# Patient Record
Sex: Female | Born: 1940 | Race: White | Hispanic: No | Smoking: Former smoker
Health system: Southern US, Community
[De-identification: ages and names within clinical notes are randomized; demographics above are authoritative.]

## PROBLEM LIST (undated history)

## (undated) DIAGNOSIS — C801 Malignant (primary) neoplasm, unspecified: Secondary | ICD-10-CM

## (undated) DIAGNOSIS — I2510 Atherosclerotic heart disease of native coronary artery without angina pectoris: Secondary | ICD-10-CM

## (undated) DIAGNOSIS — I5022 Chronic systolic (congestive) heart failure: Secondary | ICD-10-CM

## (undated) DIAGNOSIS — C259 Malignant neoplasm of pancreas, unspecified: Secondary | ICD-10-CM

## (undated) DIAGNOSIS — E785 Hyperlipidemia, unspecified: Secondary | ICD-10-CM

## (undated) HISTORY — PX: BREAST SURGERY: SHX581

## (undated) HISTORY — PX: OTHER SURGICAL HISTORY: SHX169

## (undated) HISTORY — PX: OVARY SURGERY: SHX727

## (undated) HISTORY — DX: Hyperlipidemia, unspecified: E78.5

## (undated) HISTORY — DX: Malignant (primary) neoplasm, unspecified: C80.1

## (undated) HISTORY — PX: EYE SURGERY: SHX253

## (undated) HISTORY — PX: COLONOSCOPY: SHX174

## (undated) HISTORY — DX: Atherosclerotic heart disease of native coronary artery without angina pectoris: I25.10

---

## 1999-02-25 HISTORY — PX: CORONARY ARTERY BYPASS GRAFT: SHX141

## 1999-02-25 HISTORY — PX: CARDIAC CATHETERIZATION: SHX172

## 2014-09-28 ENCOUNTER — Ambulatory Visit (INDEPENDENT_AMBULATORY_CARE_PROVIDER_SITE_OTHER): Payer: Medicare HMO | Admitting: Family Medicine

## 2014-09-28 VITALS — BP 142/80 | HR 70 | Temp 97.6°F | Resp 16 | Ht <= 58 in | Wt 153.2 lb

## 2014-09-28 DIAGNOSIS — I10 Essential (primary) hypertension: Secondary | ICD-10-CM | POA: Diagnosis not present

## 2014-09-28 MED ORDER — METOPROLOL TARTRATE 25 MG PO TABS: 12.5000 mg | ORAL_TABLET | Freq: Two times a day (BID) | ORAL | Status: DC

## 2014-09-28 NOTE — Progress Notes (Signed)
Subjective:   This chart was scribed for Dr. Delman Cheadle, MD by Erling Conte, ED Scribe. This patient was seen in Room 2 and the patient's care was started at 8:55 AM.   Patient ID: Tammy Schmitt, female    DOB: February 07, 1941, 74 y.o.   MRN: [id number]  Chief Complaint  Patient presents with  . Medication Refill    metoprolol tart tab 25mg      HPI HPI Comments: Tammy Schmitt is a 74 y.o. female who presents to the [facility] for a refill of her metoprolol medication. Pt is taking the medication with no complaints. She states she is new to [facility] and has an app with [facility] and has an appt in September. Pt does not have a BP cuff at home. Pt brought in her lab work results from April and she had a normal CMP, LDL 121 and non HDL 146. She regularly takes a 325 mg aspirin daily. She has a h/o GERD for which she takes omeprazole. Pt previously took Lipitor and states she did not like taking it due to the muscle aches. She denies any chest pain, dizziness, lightheadedness, SOB, heart palpitations, wheezing, HA or leg swelling.   There are no active problems to display for this patient.  Past Medical History  Diagnosis Date  . Cancer   . Cataract   . CHF (congestive heart failure)   . GERD (gastroesophageal reflux disease)   . Myocardial infarction    Past Surgical History  Procedure Laterality Date  . Breast surgery    . Eye surgery    . Abdominal hysterectomy     No Known Allergies Prior to Admission medications   Medication Sig Start Date End Date Taking? Authorizing Provider  aspirin 325 MG tablet Take 325 mg by mouth daily.   Yes Historical Provider, MD  metoprolol succinate (TOPROL-XL) 25 MG 24 hr tablet Take 25 mg by mouth daily.   Yes Historical Provider, MD  nitroGLYCERIN (NITROSTAT) 0.4 MG SL tablet Place 0.4 mg under the tongue every 5 (five) minutes as needed for chest pain.   Yes Historical Provider, MD  omeprazole (PRILOSEC)  10 MG capsule Take 10 mg by mouth daily.   Yes Historical Provider, MD   History   Social History  . Marital Status: Divorced    Spouse Name: N/A  . Number of Children: N/A  . Years of Education: N/A   Occupational History  . Not on file.   Social History Main Topics  . Smoking status: Former Research scientist (life sciences)  . Smokeless tobacco: Never Used  . Alcohol Use: 1.8 oz/week    3 Standard drinks or equivalent per week  . Drug Use: No  . Sexual Activity: Not on file   Other Topics Concern  . Not on file   Social History Narrative  . No narrative on file     Review of Systems  Constitutional: Negative for fever, chills, diaphoresis and appetite change.  Eyes: Negative for visual disturbance.  Respiratory: Negative for cough, shortness of breath and wheezing.   Cardiovascular: Negative for chest pain, palpitations and leg swelling.  Genitourinary: Negative for decreased urine volume.  Neurological: Negative for dizziness, syncope, light-headedness and headaches.  Hematological: Does not bruise/bleed easily.       Objective:  BP 142/80 mmHg  Pulse 70  Temp(Src) 97.6 F (36.4 C) (Oral)  Resp 16  Ht 4\' 10"  (1.473 m)  Wt 153 lb 3.2 oz (69.491 kg)  BMI 32.03  kg/m2  SpO2 99%    Physical Exam  Constitutional: She is oriented to person, place, and time. She appears well-developed and well-nourished. No distress.  HENT:  Head: Normocephalic and atraumatic.  Eyes: Conjunctivae and EOM are normal.  Neck: Neck supple. No tracheal deviation present.  Cardiovascular: Normal rate, regular rhythm, S1 normal, S2 normal and normal heart sounds.   No murmur heard. Pulses:      Dorsalis pedis pulses are 2+ on the right side, and 2+ on the left side.  Pulmonary/Chest: Effort normal and breath sounds normal. No respiratory distress. She has no wheezes. She has no rhonchi. She has no rales.  Musculoskeletal: Normal range of motion.       Right lower leg: She exhibits no swelling.       Left  lower leg: She exhibits no swelling.  Neurological: She is alert and oriented to person, place, and time.  Skin: Skin is warm and dry.  Psychiatric: She has a normal mood and affect. Her behavior is normal.  Nursing note and vitals reviewed.       Assessment & Plan:   1. Essential hypertension, benign   Meds refilled. Pt has appt to est w/ new PCP since recently moved to [place].  Meds ordered this encounter  Medications  . aspirin 325 MG tablet    Sig: Take 325 mg by mouth daily.  Marland Kitchen omeprazole (PRILOSEC) 10 MG capsule    Sig: Take 10 mg by mouth daily.  . nitroGLYCERIN (NITROSTAT) 0.4 MG SL tablet    Sig: Place 0.4 mg under the tongue every 5 (five) minutes as needed for chest pain.  Marland Kitchen DISCONTD: metoprolol succinate (TOPROL-XL) 25 MG 24 hr tablet    Sig: Take 25 mg by mouth daily.  . metoprolol tartrate (LOPRESSOR) 25 MG tablet    Sig: Take 0.5 tablets (12.5 mg total) by mouth 2 (two) times daily.    Dispense:  90 tablet    Refill:  3    I personally performed the services described in this documentation, which was scribed in my presence. The recorded information has been reviewed and considered, and addended by me as needed.  Delman Cheadle, MD MPH

## 2014-09-28 NOTE — Patient Instructions (Signed)

## 2014-11-08 ENCOUNTER — Ambulatory Visit (INDEPENDENT_AMBULATORY_CARE_PROVIDER_SITE_OTHER): Payer: Medicare HMO | Admitting: Cardiovascular Disease

## 2014-11-08 ENCOUNTER — Encounter: Payer: Self-pay | Admitting: Cardiovascular Disease

## 2014-11-08 VITALS — BP 144/88 | HR 60 | Ht <= 58 in | Wt 155.4 lb

## 2014-11-08 DIAGNOSIS — I10 Essential (primary) hypertension: Secondary | ICD-10-CM

## 2014-11-08 DIAGNOSIS — I2510 Atherosclerotic heart disease of native coronary artery without angina pectoris: Secondary | ICD-10-CM | POA: Diagnosis not present

## 2014-11-08 LAB — HEPATIC FUNCTION PANEL
ALBUMIN: 4.1 g/dL (ref 3.5–5.2)
ALK PHOS: 77 U/L (ref 39–117)
ALT: 9 U/L (ref 0–35)
AST: 17 U/L (ref 0–37)
Bilirubin, Direct: 0.1 mg/dL (ref 0.0–0.3)
TOTAL PROTEIN: 7.1 g/dL (ref 6.0–8.3)
Total Bilirubin: 0.5 mg/dL (ref 0.2–1.2)

## 2014-11-08 LAB — BASIC METABOLIC PANEL
BUN: 21 mg/dL (ref 6–23)
CALCIUM: 9.5 mg/dL (ref 8.4–10.5)
CO2: 24 mEq/L (ref 19–32)
Chloride: 103 mEq/L (ref 96–112)
Creatinine, Ser: 0.54 mg/dL (ref 0.40–1.20)
GFR: 117.35 mL/min (ref >60.00)
GLUCOSE: 87 mg/dL (ref 70–99)
Potassium: 4.1 mEq/L (ref 3.5–5.1)
Sodium: 137 mEq/L (ref 135–145)

## 2014-11-08 LAB — LIPID PANEL
Cholesterol: 220 mg/dL — ABNORMAL HIGH (ref 0–200)
HDL: 50.1 mg/dL
LDL Cholesterol: 143 mg/dL — ABNORMAL HIGH (ref 0–99)
NonHDL: 170.38
Total CHOL/HDL Ratio: 4
Triglycerides: 138 mg/dL (ref 0.0–149.0)
VLDL: 27.6 mg/dL (ref 0.0–40.0)

## 2014-11-08 MED ORDER — LOSARTAN POTASSIUM 50 MG PO TABS: 50.0000 mg | ORAL_TABLET | Freq: Every day | ORAL | Status: DC

## 2014-11-08 MED ORDER — ASPIRIN EC 81 MG PO TBEC: 81.0000 mg | DELAYED_RELEASE_TABLET | Freq: Every day | ORAL | Status: AC

## 2014-11-08 MED ORDER — EZETIMIBE 10 MG PO TABS: 10.0000 mg | ORAL_TABLET | Freq: Every day | ORAL | Status: DC

## 2014-11-08 NOTE — Progress Notes (Signed)
Cardiology Office Note   Date:  11/08/2014   ID:  Tammy Schmitt, DOB Apr 21, 1940, MRN [id number]  PCP:  Tammy Frees, MD  Cardiologist:   Tammy Headings, MD   Chief Complaint  Patient presents with  . Coronary Artery Disease   Problem list: 1. Coronary artery disease-status post coronary artery bypass grafting status post RIMA to the right coronary artery and saphenous vein graft to the posterior lateral circumflex proximal artery in 2002 2. Hyperlipidemia:  3. Mild carotid artery disease:  4. Mild chronic systolic congestive heart failure 5. Hx of breast cancer ( left ) , s/p XRT and chemo and surgery /    History of Present Illness: Tammy Schmitt is a 74 y.o. female who presents to establish care for her CAD.  Moved from [place] this past spring  Has had some indigestion / heartburn.  No angina .  Relieved with rolaids. BP has been higher recently  Diet is about the same. Has joined the  [facility] but has not been exercising. Thinks she can do better with her nutrition .  Not able to walk far  -   Past Medical History  Diagnosis Date  . Cancer   . Cataract   . CHF (congestive heart failure)   . GERD (gastroesophageal reflux disease)   . Myocardial infarction     Past Surgical History  Procedure Laterality Date  . Breast surgery    . Eye surgery    . Abdominal hysterectomy       Current Outpatient Prescriptions  Medication Sig Dispense Refill  . aspirin 325 MG tablet Take 325 mg by mouth daily.    . metoprolol tartrate (LOPRESSOR) 25 MG tablet Take 0.5 tablets (12.5 mg total) by mouth 2 (two) times daily. 90 tablet 3  . nitroGLYCERIN (NITROSTAT) 0.4 MG SL tablet Place 0.4 mg under the tongue every 5 (five) minutes as needed for chest pain.    Marland Kitchen omeprazole (PRILOSEC) 10 MG capsule Take 10 mg by mouth daily.     No current facility-administered medications for this visit.    Allergies:   Review of patient's allergies indicates no known  allergies.    Social History:  The patient  reports that she has quit smoking. She has never used smokeless tobacco. She reports that she drinks about 1.8 oz of alcohol per week. She reports that she does not use illicit drugs.   Family History:  The patient's family history includes Cancer in her mother; Heart disease in her brother and father.    ROS:  Please see the history of present illness.    Review of Systems: Constitutional:  denies fever, chills, diaphoresis, appetite change and fatigue.  HEENT: denies photophobia, eye pain, redness, hearing loss, ear pain, congestion, sore throat, rhinorrhea, sneezing, neck pain, neck stiffness and tinnitus.  Respiratory: admits to  DOE,    Cardiovascular: denies chest pain, palpitations and leg swelling.  Gastrointestinal: denies nausea, vomiting, abdominal pain, diarrhea, constipation, blood in stool.  Genitourinary: denies dysuria, urgency, frequency, hematuria, flank pain and difficulty urinating.  Musculoskeletal: denies  myalgias, back pain, joint swelling, arthralgias and gait problem.   Skin: denies pallor, rash and wound.  Neurological: denies dizziness, seizures, syncope, weakness, light-headedness, numbness and headaches.   Hematological: denies adenopathy, easy bruising, personal or family bleeding history.  Psychiatric/ Behavioral: denies suicidal ideation, mood changes, confusion, nervousness, sleep disturbance and agitation.       All other systems are reviewed and negative.  PHYSICAL EXAM: VS:  BP 144/88 mmHg  Pulse 60  Ht 4\' 10"  (1.473 m)  Wt 70.489 kg (155 lb 6.4 oz)  BMI 32.49 kg/m2 , BMI Body mass index is 32.49 kg/(m^2). GEN: Well nourished, well developed, in no acute distress HEENT: normal Neck: no JVD, carotid bruits, or masses Cardiac: RRR; no murmurs, rubs, or gallops,no edema  Respiratory:  clear to auscultation bilaterally, normal work of breathing GI: soft, nontender, nondistended, + BS MS: no  deformity or atrophy Skin: warm and dry, no rash Neuro:  Strength and sensation are intact Psych: normal   EKG:  EKG is ordered today. The ekg ordered today demonstrates  NSR at 60, NS ST abn.     Recent Labs: No results found for requested labs within last 365 days.    Lipid Panel No results found for: CHOL, TRIG, HDL, CHOLHDL, VLDL, LDLCALC, LDLDIRECT    Wt Readings from Last 3 Encounters:  11/08/14 70.489 kg (155 lb 6.4 oz)  09/28/14 69.491 kg (153 lb 3.2 oz)      Other studies Reviewed: Additional studies/ records that were reviewed today include: . Review of the above records demonstrates:    ASSESSMENT AND PLAN:  1.  CAD - she status post coronary artery bypass grafting. She's not having any episodes of angina. We'll check fasting lipids today. We'll send her to a nutritionist.  encouraged her to exercise.  2. Essential Hypertension:  Will need to better on her diet and exercise program. We will start losartan 50 mg a day. We'll check a basic medical profile in 3 weeks.  3. Hyperlipidemia :  Intolerant to statins. Will try Zetia 10 mg a day She will come in a few days early for her fasting lipids, liver enz. And BMP prior to her 3 month OV.    Current medicines are reviewed at length with the patient today.  The patient has concerns regarding medicines.  The following changes have been made:  no change  Labs/ tests ordered today include:  No orders of the defined types were placed in this encounter.     Disposition:   FU with me in 6 months      Tammy Schmitt, Tammy Cheng, MD  11/08/2014 10:13 AM    [facility] [place], [place], [place]  [address] Phone: [phone]; Fax: [phone]   [facility]  [address] [place], [place]  [address] [phone]   Fax [phone]

## 2014-11-08 NOTE — Patient Instructions (Addendum)
Estelle Grumbles - nutritionist [phone]  Medication Instructions:  DECREASE Aspirin to 81 mg once daily START Zetia 10 mg once daily START Losartan 50 mg once daily   Labwork: TODAY - cholesterol, liver, basic metabolic panel  Your physician recommends that you return for lab work in: 3 weeks for basic metabolic panel  Your physician recommends that you return for lab work in: 3 months on the day of or a few days before your office visit with Dr. Acie Fredrickson.  You will need to FAST for this appointment - nothing to eat or drink after midnight the night before except water.   Testing/Procedures: None Ordered   Follow-Up: Your physician recommends that you schedule a follow-up appointment in: 3 months with Dr. Acie Fredrickson.

## 2014-11-15 ENCOUNTER — Other Ambulatory Visit: Payer: Self-pay | Admitting: Family Medicine

## 2014-11-15 ENCOUNTER — Ambulatory Visit (INDEPENDENT_AMBULATORY_CARE_PROVIDER_SITE_OTHER): Payer: Medicare HMO | Admitting: Family Medicine

## 2014-11-15 VITALS — BP 124/70 | HR 64 | Temp 97.9°F | Resp 18 | Ht <= 58 in | Wt 155.0 lb

## 2014-11-15 DIAGNOSIS — I10 Essential (primary) hypertension: Secondary | ICD-10-CM | POA: Diagnosis not present

## 2014-11-15 DIAGNOSIS — Z951 Presence of aortocoronary bypass graft: Secondary | ICD-10-CM | POA: Diagnosis not present

## 2014-11-15 DIAGNOSIS — R12 Heartburn: Secondary | ICD-10-CM | POA: Diagnosis not present

## 2014-11-15 DIAGNOSIS — Z8742 Personal history of other diseases of the female genital tract: Secondary | ICD-10-CM

## 2014-11-15 DIAGNOSIS — K219 Gastro-esophageal reflux disease without esophagitis: Secondary | ICD-10-CM

## 2014-11-15 LAB — COMPREHENSIVE METABOLIC PANEL
ALBUMIN: 4.6 g/dL (ref 3.6–5.1)
ALK PHOS: 88 U/L (ref 33–130)
ALT: 11 U/L (ref 6–29)
AST: 21 U/L (ref 10–35)
BILIRUBIN TOTAL: 0.5 mg/dL (ref 0.2–1.2)
BUN: 14 mg/dL (ref 7–25)
CALCIUM: 9.5 mg/dL (ref 8.6–10.4)
CO2: 26 mmol/L (ref 20–31)
Chloride: 101 mmol/L (ref 98–110)
Creat: 0.58 mg/dL — ABNORMAL LOW (ref 0.60–0.93)
Glucose, Bld: 71 mg/dL (ref 65–99)
POTASSIUM: 4 mmol/L (ref 3.5–5.3)
Sodium: 139 mmol/L (ref 135–146)
TOTAL PROTEIN: 7.4 g/dL (ref 6.1–8.1)

## 2014-11-15 LAB — POCT CBC
GRANULOCYTE PERCENT: 62.6 % (ref 37–80)
HEMATOCRIT: 44.2 % (ref 37.7–47.9)
Hemoglobin: 14.3 g/dL (ref 12.2–16.2)
Lymph, poc: 2.8 (ref 0.6–3.4)
MCH: 28.1 pg (ref 27–31.2)
MCHC: 32.3 g/dL (ref 31.8–35.4)
MCV: 87.2 fL (ref 80–97)
MID (CBC): 0.1 (ref 0–0.9)
MPV: 7 fL (ref 0–99.8)
PLATELET COUNT, POC: 332 10*3/uL (ref 142–424)
POC Granulocyte: 4.8 (ref 2–6.9)
POC LYMPH %: 36.3 % (ref 10–50)
POC MID %: 1.1 % (ref 0–12)
RBC: 5.07 M/uL (ref 4.04–5.48)
RDW, POC: 14.6 %
WBC: 7.7 10*3/uL (ref 4.6–10.2)

## 2014-11-15 MED ORDER — ESOMEPRAZOLE MAGNESIUM 40 MG PO CPDR: 40.0000 mg | DELAYED_RELEASE_CAPSULE | Freq: Every day | ORAL | Status: DC

## 2014-11-15 NOTE — Progress Notes (Signed)
Patient ID: Nayleah Gamel, female    DOB: 1941-01-30  Age: 74 y.o. MRN: [id number]  Chief Complaint  Patient presents with  . Heartburn    started in may     Subjective:   74 year old who moved here from [place] in May. Ever since then she's been having a lot of heartburn. She moved down here to be closer with family. She is with her son. The patient has a history of having had coronary bypass. She is seeing a cardiologist who follows her here. Her pains have not acted like they're from her heart. However yesterday she she had such bad heartburn that she took a couple of nitroglycerin which really didn't help her much. She said maybe it helped a little bit but she took Tums that did better. She has been on omeprazole 10 mg daily for a long time. Has never been tested out further. She worries about having Barrett's esophagus. No problem with her bowels. No major chronic abdominal pain other than this heartburn which is across epigastric region. She's not had any abdominal surgeries. She does have a uterine prolapse history. She wants to straighten this out before she does anything with her uterine prolapse.  Current allergies, medications, problem list, past/family and social histories reviewed.  Objective:  BP 124/70 mmHg  Pulse 64  Temp(Src) 97.9 F (36.6 C) (Oral)  Resp 18  Ht 4\' 10"  (1.473 m)  Wt 155 lb (70.308 kg)  BMI 32.40 kg/m2  SpO2 96%  No major acute distress. Chest clear. Heart regular without murmurs. Abdomen has normal bowel sounds, soft without organomegaly, masses, or tenderness.  Assessment & Plan:   Assessment: 1. Heartburn   2. Gastroesophageal reflux disease, esophagitis presence not specified   3. Essential hypertension   4. History of coronary artery bypass graft   5. History of uterine prolapse       Plan: Check for H. pylori. Treat with Nexium. If not improving sent to gastroenterology.  Orders Placed This Encounter  Procedures  .  Comprehensive metabolic panel  . HELICOBACTER PYLORI  ANTIBODY, IGM  . POCT CBC    Meds ordered this encounter  Medications  . esomeprazole (NEXIUM) 40 MG capsule    Sig: Take 1 capsule (40 mg total) by mouth daily.    Dispense:  30 capsule    Refill:  1         Patient Instructions  Discontinue the omeprazole  Begin Nexium 40 mg one daily, best taken in the evening  If you ever are acutely worse with increasing pain go to the emergency room or call 911 if necessary  Avoid any foods that you know might irritate your stomach. Also avoid excessive alcohol.  If stomach is not feeling better with less heartburn over the next week please let me know and we will make referral to [facility] gastroenterology after we have seen the results of the H. pylori test.  We will let you know the results of your labs in a few days.  Return at any time if needed.      No Follow-up on file.   HOPPER,DAVID, MD 11/15/2014

## 2014-11-15 NOTE — Patient Instructions (Signed)
Discontinue the omeprazole  Begin Nexium 40 mg one daily, best taken in the evening  If you ever are acutely worse with increasing pain go to the emergency room or call 911 if necessary  Avoid any foods that you know might irritate your stomach. Also avoid excessive alcohol.  If stomach is not feeling better with less heartburn over the next week please let me know and we will make referral to [facility] gastroenterology after we have seen the results of the H. pylori test.  We will let you know the results of your labs in a few days.  Return at any time if needed.

## 2014-11-17 ENCOUNTER — Telehealth: Payer: Self-pay

## 2014-11-17 LAB — H. PYLORI BREATH TEST: H. pylori Breath Test: NOT DETECTED

## 2014-11-17 NOTE — Telephone Encounter (Signed)
Received a call from [facility]. She advised incorrect test code [phone] was entered for the H.Pylori breath test. She advised she would change test from [phone] to [phone] - the correct test code.

## 2014-11-18 ENCOUNTER — Encounter: Payer: Self-pay | Admitting: *Deleted

## 2014-11-29 ENCOUNTER — Other Ambulatory Visit (INDEPENDENT_AMBULATORY_CARE_PROVIDER_SITE_OTHER): Payer: Medicare HMO | Admitting: *Deleted

## 2014-11-29 DIAGNOSIS — I10 Essential (primary) hypertension: Secondary | ICD-10-CM | POA: Diagnosis not present

## 2014-11-29 LAB — BASIC METABOLIC PANEL
BUN: 20 mg/dL (ref 7–25)
CHLORIDE: 106 mmol/L (ref 98–110)
CO2: 25 mmol/L (ref 20–31)
CREATININE: 0.62 mg/dL (ref 0.60–0.93)
Calcium: 9.2 mg/dL (ref 8.6–10.4)
Glucose, Bld: 94 mg/dL (ref 65–99)
POTASSIUM: 4 mmol/L (ref 3.5–5.3)
Sodium: 136 mmol/L (ref 135–146)

## 2014-11-30 ENCOUNTER — Other Ambulatory Visit: Payer: Self-pay | Admitting: Nurse Practitioner

## 2014-12-15 ENCOUNTER — Telehealth: Payer: Self-pay

## 2014-12-15 NOTE — Telephone Encounter (Signed)
I would try increasing Nexium to bid and adding Zanatc 150mg  bid to see if we can control these symptoms.  A referral to GI might be appropriate also if the patient is willing please make the referral.

## 2014-12-15 NOTE — Telephone Encounter (Signed)
Called pt, Left message for pt to call back.  

## 2014-12-15 NOTE — Telephone Encounter (Signed)
Olen Pel, nurse practitioner is calling because the patient has been taking esomeprazole magnesuim and it hasn't been helping. She would like to know if there's anything else we can prescribe. Please call patient! [phone]

## 2014-12-21 NOTE — Telephone Encounter (Signed)
Pt has appt with PCP and will see them about this and see what they have to say and if she needs to see a GI

## 2014-12-26 ENCOUNTER — Ambulatory Visit (INDEPENDENT_AMBULATORY_CARE_PROVIDER_SITE_OTHER): Payer: Medicare HMO | Admitting: Family Medicine

## 2014-12-26 VITALS — BP 122/72 | HR 81 | Temp 98.4°F | Resp 17 | Ht <= 58 in | Wt 157.0 lb

## 2014-12-26 DIAGNOSIS — I25119 Atherosclerotic heart disease of native coronary artery with unspecified angina pectoris: Secondary | ICD-10-CM | POA: Diagnosis not present

## 2014-12-26 DIAGNOSIS — R12 Heartburn: Secondary | ICD-10-CM | POA: Diagnosis not present

## 2014-12-26 DIAGNOSIS — R1013 Epigastric pain: Secondary | ICD-10-CM | POA: Diagnosis not present

## 2014-12-26 NOTE — Progress Notes (Addendum)
Subjective:  This chart was scribed for Tammy Ray, MD by Moises Blood, Medical Scribe. This patient was seen in room 3 and the patient's care was started 3:22 PM.   Patient ID: Tammy Schmitt, female    DOB: 08/02/1940, 74 y.o.   MRN: [id number]  HPI [name] is a 74 y.o. female Here for referral, h/o CAD, status post CABG 2002, mild chronic CHF, HLD, mild carotid artery disease, h/o left breast cancer status post chemo and surgery. Recently established care with [facility] sept 14th. She's still taking zetia 10mg  for her cholesterol, with h/o CAD. In sept, she had losartan 50mg  qd added.   Heart burn Noted on cardiology visit, was taking rolaids at that time; she was seen by Dr. Linna Darner sept 21st. Had been taking omeprazole 40 mg for years, changed to nexium 40 mg QD; negative H pylori breath test, nl cmp with borderline low creatinine, nl cbc. Creatinine has normalized on most recent on oct 25th. Phone note oct 21st, nexium bid, and zantac 150 mg bid.   She wants a referral to GI. She had less heartburn with the nexium, but still present. She notes that the pain is located in upper abd and radiates to her lower back. She denies fever, unexpected weight loss, diaphoresis, blood in stool, diarrhea. She always has had micro-hematuria in the past, eval by urologist and said "not a big deal". She still eats some spicy foods.   Chest Pain She notes having some chest pains. She takes nitroglycerin with some mild relief; she took it once about every few weeks. If she does any activity quickly, she feels "stressed out with pressure and shortness of breath" throughout the body. This has been consistent throughout many years. She denies nausea, chest pain and diaphoresis with activity. She denies routine exercise. She denies any association with eating a meal.   Her last stress test was a year ago. She had to stop before it was finished. Last chemical stress test was 5 years ago.   PCP She  went [facility] by recommendation of her family; however, she [facility].   There are no active problems to display for this patient.  Past Medical History  Diagnosis Date  . Cancer ([place])   . Cataract   . CHF (congestive heart failure) ([place])   . GERD (gastroesophageal reflux disease)   . Myocardial infarction [facility])    Past Surgical History  Procedure Laterality Date  . Breast surgery    . Eye surgery    . Abdominal hysterectomy     No Known Allergies Prior to Admission medications   Medication Sig Start Date End Date Taking? Authorizing Provider  aspirin EC 81 MG tablet Take 1 tablet (81 mg total) by mouth daily. 11/08/14  Yes Thayer Headings, MD  esomeprazole (NEXIUM) 40 MG capsule Take 1 capsule (40 mg total) by mouth daily. 11/15/14  Yes Posey Boyer, MD  metoprolol tartrate (LOPRESSOR) 25 MG tablet Take 0.5 tablets (12.5 mg total) by mouth 2 (two) times daily. 09/28/14  Yes Shawnee Knapp, MD  nitroGLYCERIN (NITROSTAT) 0.4 MG SL tablet Place 0.4 mg under the tongue every 5 (five) minutes as needed for chest pain.   Yes Historical Provider, MD  losartan (COZAAR) 50 MG tablet Take 1 tablet (50 mg total) by mouth daily. Patient not taking: Reported on 12/26/2014 11/08/14   Thayer Headings, MD   Social History   Social History  .  Marital Status: Divorced    Spouse Name: N/A  . Number of Children: N/A  . Years of Education: N/A   Occupational History  . Not on file.   Social History Main Topics  . Smoking status: Former Smoker    Quit date: 12/26/1999  . Smokeless tobacco: Never Used  . Alcohol Use: 1.8 oz/week    3 Standard drinks or equivalent per week  . Drug Use: No  . Sexual Activity: Not on file   Other Topics Concern  . Not on file   Social History Narrative    Review of Systems  Constitutional: Negative for fever, diaphoresis and unexpected weight change.  Respiratory: Positive for shortness of breath.   Cardiovascular: Positive for  chest pain.  Gastrointestinal: Positive for abdominal pain. Negative for nausea, diarrhea and blood in stool.  Genitourinary: Positive for flank pain.       Objective:   Physical Exam  Constitutional: She is oriented to person, place, and time. She appears well-developed and well-nourished. No distress.  HENT:  Head: Normocephalic and atraumatic.  Eyes: EOM are normal. Pupils are equal, round, and reactive to light.  Neck: Neck supple.  Cardiovascular: Normal rate.   Pulmonary/Chest: Effort normal. No respiratory distress.  Abdominal: Soft. Bowel sounds are normal. She exhibits no distension. There is no CVA tenderness.  Epigastric mild tenderness, abd nontender, no midline tenderness, not able to reproduce pain  Musculoskeletal: Normal range of motion.  Neurological: She is alert and oriented to person, place, and time.  Skin: Skin is warm and dry.  Psychiatric: She has a normal mood and affect. Her behavior is normal.  Nursing note and vitals reviewed.   Filed Vitals:   12/26/14 1507  BP: 122/72  Pulse: 81  Temp: 98.4 F (36.9 C)  TempSrc: Oral  Resp: 17  Height: 4\' 10"  (1.473 m)  Weight: 157 lb (71.215 kg)  SpO2: 94%    EKG: SR, Q wave inferior leads - old inferior infarct, no apparent acute ST/T wave changes.      Assessment & Plan:  [name] is a 74 y.o. female Coronary artery disease involving native coronary artery of native heart with angina pectoris ([place]) - Plan: EKG 12-Lead  Heartburn - Plan: EKG 12-Lead, Ambulatory referral to Gastroenterology  Abdominal pain, epigastric - Plan: EKG 12-Lead, Ambulatory referral to Gastroenterology   Persistent epigastric abdominal discomfort along with heartburn symptoms.  History of underlying vascular disease with CAD status post CABG, and carotid artery disease,  And differential includes anginal equivalent -  Especially with  Prior use of nitroglycerin and improvement in symptoms. However she denied using  nitroglycerin recently, and did have some improvement in her symptoms with use of Nexium.  No apparent changes on her EKG from cardiology visit in September.Differential includes peptic ulcer disease, in addition to reflux. Differential also includes chronic mesenteric ischemia with her vascular disease, but denies any postprandial epigastric cramping, bloody stools, diarrhea,  Or association of symptoms with eating.  - refer to gastroenterology for further evaluation, continue PPI for now,  Trigger avoidance.  -  Advised if any persistent chest symptoms, or any symptoms in her chest the improved with use of her nitroglycerin, should discuss this immediately with her cardiologist, or ER/ 911 precautions  With any chest pain.  - plans on scheduling appointment with primary care provider here or other practice  [facility].    No orders of the defined types were placed in this encounter.   Patient Instructions  Okay to continue the Nexium once or twice a day at the most. Avoid foods below that may contribute to heartburn. I will refer you to gastroenterologist for further evaluation. If you do have symptoms in the chest, especially if taking nitroglycerin and the symptoms improved, recommend you be seen as soon as possible by your cardiologist, or evaluation through the emergency room if your pain persists.   Return to the clinic or go to the nearest emergency room if any of your symptoms worsen or new symptoms occur.  Food Choices for Gastroesophageal Reflux Disease, Adult When you have gastroesophageal reflux disease (GERD), the foods you eat and your eating habits are very important. Choosing the right foods can help ease the discomfort of GERD. WHAT GENERAL GUIDELINES DO I NEED TO FOLLOW?  Choose fruits, vegetables, whole grains, low-fat dairy products, and low-fat meat, fish, and poultry.  Limit fats such as oils, salad dressings, butter, nuts, and avocado.  Keep a food diary to  identify foods that cause symptoms.  Avoid foods that cause reflux. These may be different for different people.  Eat frequent small meals instead of three large meals each day.  Eat your meals slowly, in a relaxed setting.  Limit fried foods.  Cook foods using methods other than frying.  Avoid drinking alcohol.  Avoid drinking large amounts of liquids with your meals.  Avoid bending over or lying down until 2-3 hours after eating. WHAT FOODS ARE NOT RECOMMENDED? The following are some foods and drinks that may worsen your symptoms: Vegetables Tomatoes. Tomato juice. Tomato and spaghetti sauce. Chili peppers. Onion and garlic. Horseradish. Fruits Oranges, grapefruit, and lemon (fruit and juice). Meats High-fat meats, fish, and poultry. This includes hot dogs, ribs, ham, sausage, salami, and bacon. Dairy Whole milk and chocolate milk. Sour cream. Cream. Butter. Ice cream. Cream cheese.  Beverages Coffee and tea, with or without caffeine. Carbonated beverages or energy drinks. Condiments Hot sauce. Barbecue sauce.  Sweets/Desserts Chocolate and cocoa. Donuts. Peppermint and spearmint. Fats and Oils High-fat foods, including [place] fries and potato chips. Other Vinegar. Strong spices, such as black pepper, white pepper, red pepper, cayenne, curry powder, cloves, ginger, and chili powder. The items listed above may not be a complete list of foods and beverages to avoid. Contact your dietitian for more information.   This information is not intended to replace advice given to you by your health care provider. Make sure you discuss any questions you have with your health care provider.   Document Released: 02/10/2005 Document Revised: 03/03/2014 Document Reviewed: 12/15/2012 Elsevier Interactive Patient Education [employer].        By signing my name below, I, Moises Blood, attest that this documentation has been prepared under the direction and in the presence  of Tammy Ray, MD. Electronically Signed: Moises Blood, [place]. 12/26/2014 , 3:22 PM .  I personally performed the services described in this documentation, which was scribed in my presence. The recorded information has been reviewed and considered, and addended by me as needed.

## 2014-12-26 NOTE — Patient Instructions (Addendum)
Okay to continue the Nexium once or twice a day at the most. Avoid foods below that may contribute to heartburn. I will refer you to gastroenterologist for further evaluation. If you do have symptoms in the chest, especially if taking nitroglycerin and the symptoms improved, recommend you be seen as soon as possible by your cardiologist, or evaluation through the emergency room if your pain persists.   Return to the clinic or go to the nearest emergency room if any of your symptoms worsen or new symptoms occur.  Food Choices for Gastroesophageal Reflux Disease, Adult When you have gastroesophageal reflux disease (GERD), the foods you eat and your eating habits are very important. Choosing the right foods can help ease the discomfort of GERD. WHAT GENERAL GUIDELINES DO I NEED TO FOLLOW?  Choose fruits, vegetables, whole grains, low-fat dairy products, and low-fat meat, fish, and poultry.  Limit fats such as oils, salad dressings, butter, nuts, and avocado.  Keep a food diary to identify foods that cause symptoms.  Avoid foods that cause reflux. These may be different for different people.  Eat frequent small meals instead of three large meals each day.  Eat your meals slowly, in a relaxed setting.  Limit fried foods.  Cook foods using methods other than frying.  Avoid drinking alcohol.  Avoid drinking large amounts of liquids with your meals.  Avoid bending over or lying down until 2-3 hours after eating. WHAT FOODS ARE NOT RECOMMENDED? The following are some foods and drinks that may worsen your symptoms: Vegetables Tomatoes. Tomato juice. Tomato and spaghetti sauce. Chili peppers. Onion and garlic. Horseradish. Fruits Oranges, grapefruit, and lemon (fruit and juice). Meats High-fat meats, fish, and poultry. This includes hot dogs, ribs, ham, sausage, salami, and bacon. Dairy Whole milk and chocolate milk. Sour cream. Cream. Butter. Ice cream. Cream cheese.  Beverages Coffee  and tea, with or without caffeine. Carbonated beverages or energy drinks. Condiments Hot sauce. Barbecue sauce.  Sweets/Desserts Chocolate and cocoa. Donuts. Peppermint and spearmint. Fats and Oils High-fat foods, including [place] fries and potato chips. Other Vinegar. Strong spices, such as black pepper, white pepper, red pepper, cayenne, curry powder, cloves, ginger, and chili powder. The items listed above may not be a complete list of foods and beverages to avoid. Contact your dietitian for more information.   This information is not intended to replace advice given to you by your health care provider. Make sure you discuss any questions you have with your health care provider.   Document Released: 02/10/2005 Document Revised: 03/03/2014 Document Reviewed: 12/15/2012 Elsevier Interactive Patient Education [employer].

## 2015-01-01 ENCOUNTER — Encounter: Payer: Self-pay | Admitting: Internal Medicine

## 2015-01-07 ENCOUNTER — Other Ambulatory Visit: Payer: Self-pay | Admitting: Family Medicine

## 2015-01-24 ENCOUNTER — Encounter: Payer: Self-pay | Admitting: Internal Medicine

## 2015-01-24 ENCOUNTER — Ambulatory Visit (INDEPENDENT_AMBULATORY_CARE_PROVIDER_SITE_OTHER): Payer: Medicare HMO | Admitting: Internal Medicine

## 2015-01-24 VITALS — BP 124/72 | HR 72 | Ht <= 58 in | Wt 154.0 lb

## 2015-01-24 DIAGNOSIS — G8929 Other chronic pain: Secondary | ICD-10-CM | POA: Diagnosis not present

## 2015-01-24 DIAGNOSIS — R1013 Epigastric pain: Secondary | ICD-10-CM

## 2015-01-24 DIAGNOSIS — R1310 Dysphagia, unspecified: Secondary | ICD-10-CM

## 2015-01-24 DIAGNOSIS — K219 Gastro-esophageal reflux disease without esophagitis: Secondary | ICD-10-CM

## 2015-01-24 MED ORDER — OMEPRAZOLE 40 MG PO CPDR: 40.0000 mg | DELAYED_RELEASE_CAPSULE | Freq: Two times a day (BID) | ORAL | Status: AC

## 2015-01-24 NOTE — Progress Notes (Signed)
HISTORY OF PRESENT ILLNESS:  Tammy Schmitt is a 74 y.o. female with multiple significant medical problems including history of coronary artery disease status post CABG, mild CHF, left breast cancer, and chronic GERD. She moved [facility] May 2016 from [place]. She is self-referred today regarding problems with refractory GERD and epigastric pain. She did establish with cardiology in September. Upcoming appointment with her new primary care in December. The patient reports to me a many year history of indigestion and heartburn for which she has been on omeprazole 40 mg daily. The medication was effective. About 2 months ago she began to develop breakthrough heartburn and indigestion. Around that time she noticed problems with epigastric pain which she describes as focal and burning. Antacids seems to help the pain. She did try nitroglycerin for the pain which did not help. She was switched from omeprazole to Nexium 40 mg daily. No change in symptoms. She decided to take omeprazole and Nexium. With this regimen, her heartburn has resolved. However she continues with the epigastric discomfort. She noticed the discomfort daily over the past 3 months. Severe 3-4 times per day. Not affected by meals. Does not wake her at night. There has been no weight loss or vomiting. Some nausea. She does report intermittent solid food dysphagia for years. She denies prior upper endoscopy. There is a family history of esophageal cancer in her daughter. Other vague GI complaints include belching, bloating, and gas. Outside records from [place] reviewed. Colonoscopy dated 01/13/2012 with Dr. Eleonore Chiquito revealed internal hemorrhoids, extensive sigmoid diverticulosis, and a tiny ascending colon polyp which was removed with regular biopsy. Subsequent office note dated 01/28/2012 states that the polyp biopsies were hyperplastic only. Review of outside laboratories from September and October 2016 reveal  normal comprehensive metabolic panel and CBC.  REVIEW OF SYSTEMS:  All non-GI ROS negative except for arthritis, muscle cramps, shortness of breath, hematuria  Past Medical History  Diagnosis Date  . Cancer ([place])     breast  . Cataract   . CHF (congestive heart failure) ([place])   . GERD (gastroesophageal reflux disease)   . Myocardial infarction ([place])   . CAD (coronary artery disease)   . HLD (hyperlipidemia)   . Diverticulitis   . Colon polyp   . Hypertension   . Hemorrhoids   . Obesity     Past Surgical History  Procedure Laterality Date  . Breast surgery Left     lymph nodes removed also  . Eye surgery Bilateral     cataracts removed  . Ovary surgery      one  tube removed , one ovary trimmed down  . Heart bypass      x 2, stent    Social History Tammy Schmitt  reports that she quit smoking about 15 years ago. Her smoking use included Cigarettes. She has never used smokeless tobacco. She reports that she drinks about 1.8 oz of alcohol per week. She reports that she does not use illicit drugs.  family history includes Breast cancer (age of onset: 60) in her mother; Esophageal cancer (age of onset: 48) in her daughter; Heart disease in her brother and father; Leukemia (age of onset: 19) in her son.  No Known Allergies     PHYSICAL EXAMINATION: Vital signs: BP 124/72 mmHg  Pulse 72  Ht 4\' 10"  (1.473 m)  Wt 154 lb (69.854 kg)  BMI 32.19 kg/m2  Constitutional: generally well-appearing, no acute distress Psychiatric: alert and oriented x3, cooperative  Eyes: extraocular movements intact, anicteric, conjunctiva pink Mouth: oral pharynx moist, no lesions Neck: supple without thyromegaly Lymph: no lymphadenopathy Chest. Normal median sternotomy scar Cardiovascular: heart regular rate and rhythm, no murmur Lungs: clear to auscultation bilaterally Abdomen: soft, nontender, nondistended, no obvious ascites, no peritoneal signs, normal bowel sounds, no  organomegaly Rectal: Ommitted Extremities: no clubbing cyanosis or lower extremity edema bilaterally Skin: no lesions on visible extremities Neuro: No focal deficits. No asterixis.    ASSESSMENT:  #1. GERD. Recent breakthrough symptoms improve with twice-daily PPI #2. Intermittent solid food dysphagia. Rule out peptic stricture #3. Epigastric pain as described. Unlikely related to GERD. Rule out biliary disease. Rule out significant upper GI mucosal lesion #4. History of diverticulosis and diminutive hyperplastic polyp on colonoscopy in [place] 2013. No further need for routine colon cancer screening. Advised   PLAN:  #1. Reflux precautions. Reviewed #2. Literature on GERD provided for her review #3. Prescribe omeprazole 40 mg twice daily. Proper way to take medication reviewed #4. Schedule upper endoscopy with esophageal dilation possibly to evaluate pain and dysphagia. The patient is high-risk given her comorbidities.The nature of the procedure, as well as the risks, benefits, and alternatives were carefully and thoroughly reviewed with the patient. Ample time for discussion and questions allowed. The patient understood, was satisfied, and agreed to proceed. #5. Abdominal ultrasound to evaluate epigastric pain. Rule out gallstones #6. If the above unrevealing with regards to pain, and pain persists, consider advanced imaging such as CT. #7. Encouraged to keep her upcoming primary care visit for continuity care of all non-GI issues

## 2015-01-24 NOTE — Patient Instructions (Signed)
We have sent the following medications to your pharmacy for you to pick up at your convenience:  Omeprazole  You have been scheduled for an abdominal ultrasound at [facility] (1st floor of hospital) on 01/26/2015 at 2:00pm. Please arrive 15 minutes prior to your appointment for registration. Make certain not to have anything to eat or drink 6 hours prior to your appointment. Should you need to reschedule your appointment, please contact radiology at [phone]. This test typically takes about 30 minutes to perform.  You have been scheduled for an endoscopy. Please follow written instructions given to you at your visit today. If you use inhalers (even only as needed), please bring them with you on the day of your procedure.

## 2015-01-26 ENCOUNTER — Ambulatory Visit (HOSPITAL_COMMUNITY)
Admission: RE | Admit: 2015-01-26 | Discharge: 2015-01-26 | Disposition: A | Discharge status: Home / Self Care | Payer: Medicare HMO | Source: Ambulatory Visit | Attending: Internal Medicine | Admitting: Internal Medicine

## 2015-01-26 DIAGNOSIS — K219 Gastro-esophageal reflux disease without esophagitis: Secondary | ICD-10-CM | POA: Insufficient documentation

## 2015-01-26 DIAGNOSIS — R1310 Dysphagia, unspecified: Secondary | ICD-10-CM | POA: Diagnosis not present

## 2015-01-26 DIAGNOSIS — R1013 Epigastric pain: Secondary | ICD-10-CM | POA: Diagnosis not present

## 2015-01-31 ENCOUNTER — Encounter: Payer: Self-pay | Admitting: Internal Medicine

## 2015-01-31 ENCOUNTER — Ambulatory Visit (AMBULATORY_SURGERY_CENTER): Payer: Medicare HMO | Admitting: Internal Medicine

## 2015-01-31 ENCOUNTER — Other Ambulatory Visit: Payer: Self-pay

## 2015-01-31 ENCOUNTER — Other Ambulatory Visit (INDEPENDENT_AMBULATORY_CARE_PROVIDER_SITE_OTHER): Payer: Medicare HMO

## 2015-01-31 VITALS — BP 135/68 | HR 67 | Temp 95.9°F | Resp 18 | Ht <= 58 in | Wt 154.0 lb

## 2015-01-31 DIAGNOSIS — R109 Unspecified abdominal pain: Secondary | ICD-10-CM

## 2015-01-31 DIAGNOSIS — K222 Esophageal obstruction: Secondary | ICD-10-CM

## 2015-01-31 DIAGNOSIS — I10 Essential (primary) hypertension: Secondary | ICD-10-CM | POA: Diagnosis not present

## 2015-01-31 DIAGNOSIS — K219 Gastro-esophageal reflux disease without esophagitis: Secondary | ICD-10-CM

## 2015-01-31 DIAGNOSIS — I2510 Atherosclerotic heart disease of native coronary artery without angina pectoris: Secondary | ICD-10-CM

## 2015-01-31 DIAGNOSIS — R1013 Epigastric pain: Secondary | ICD-10-CM

## 2015-01-31 DIAGNOSIS — R1310 Dysphagia, unspecified: Secondary | ICD-10-CM | POA: Diagnosis present

## 2015-01-31 DIAGNOSIS — R1010 Upper abdominal pain, unspecified: Secondary | ICD-10-CM

## 2015-01-31 LAB — BASIC METABOLIC PANEL
BUN: 13 mg/dL (ref 6–23)
CALCIUM: 9.6 mg/dL (ref 8.4–10.5)
CO2: 30 mEq/L (ref 19–32)
Chloride: 103 mEq/L (ref 96–112)
Creatinine, Ser: 0.52 mg/dL (ref 0.40–1.20)
GFR: 122.49 mL/min (ref >60.00)
Glucose, Bld: 94 mg/dL (ref 70–99)
POTASSIUM: 4.4 meq/L (ref 3.5–5.1)
Sodium: 141 mEq/L (ref 135–145)

## 2015-01-31 LAB — LIPID PANEL
CHOLESTEROL: 180 mg/dL (ref 0–200)
HDL: 41 mg/dL (ref >39.00)
LDL Cholesterol: 109 mg/dL — ABNORMAL HIGH (ref 0–99)
NONHDL: 138.86
Total CHOL/HDL Ratio: 4
Triglycerides: 147 mg/dL (ref 0.0–149.0)
VLDL: 29.4 mg/dL (ref 0.0–40.0)

## 2015-01-31 LAB — HEPATIC FUNCTION PANEL
ALT: 8 U/L (ref 0–35)
AST: 16 U/L (ref 0–37)
Albumin: 3.9 g/dL (ref 3.5–5.2)
Alkaline Phosphatase: 89 U/L (ref 39–117)
BILIRUBIN DIRECT: 0.1 mg/dL (ref 0.0–0.3)
BILIRUBIN TOTAL: 0.4 mg/dL (ref 0.2–1.2)
Total Protein: 7 g/dL (ref 6.0–8.3)

## 2015-01-31 LAB — CREATININE, SERUM: Creatinine, Ser: 0.52 mg/dL (ref 0.40–1.20)

## 2015-01-31 LAB — BUN: BUN: 13 mg/dL (ref 6–23)

## 2015-01-31 MED ORDER — SODIUM CHLORIDE 0.9 % IV SOLN: 500.0000 mL | INTRAVENOUS | Status: DC

## 2015-01-31 NOTE — Patient Instructions (Addendum)
Clear liquids until 5 pm, then soft foods rest of today. Resume prior diet tomorrow.  Handouts given: GERD, Stricture and dilation diet.  CT scan contrast given to you today. Dr.Perry's office nurse will call you with appointment date.  Blood work drawn today in [employer].  YOU HAD AN ENDOSCOPIC PROCEDURE TODAY AT [facility]:   Refer to the procedure report that was given to you for any specific questions about what was found during the examination.  If the procedure report does not answer your questions, please call your gastroenterologist to clarify.  If you requested that your care partner not be given the details of your procedure findings, then the procedure report has been included in a sealed envelope for you to review at your convenience later.  YOU SHOULD EXPECT: Some feelings of bloating in the abdomen. Passage of more gas than usual.  Walking can help get rid of the air that was put into your GI tract during the procedure and reduce the bloating. If you had a lower endoscopy (such as a colonoscopy or flexible sigmoidoscopy) you may notice spotting of blood in your stool or on the toilet paper. If you underwent a bowel prep for your procedure, you may not have a normal bowel movement for a few days.  Please Note:  You might notice some irritation and congestion in your nose or some drainage.  This is from the oxygen used during your procedure.  There is no need for concern and it should clear up in a day or so.  SYMPTOMS TO REPORT IMMEDIATELY:    Following upper endoscopy (EGD)  Vomiting of blood or coffee ground material  New chest pain or pain under the shoulder blades  Painful or persistently difficult swallowing  New shortness of breath  Fever of 100F or higher  Black, tarry-looking stools  For urgent or emergent issues, a gastroenterologist can be reached at any hour by calling [phone].   DIET:  Follow handout given. Drink plenty of fluids but you should  avoid alcoholic beverages for 24 hours.  ACTIVITY:  You should plan to take it easy for the rest of today and you should NOT DRIVE or use heavy machinery until tomorrow (because of the sedation medicines used during the test).    FOLLOW UP: Our staff will call the number listed on your records the next business day following your procedure to check on you and address any questions or concerns that you may have regarding the information given to you following your procedure. If we do not reach you, we will leave a message.  However, if you are feeling well and you are not experiencing any problems, there is no need to return our call.  We will assume that you have returned to your regular daily activities without incident.  If any biopsies were taken you will be contacted by phone or by letter within the next 1-3 weeks.  Please call us at [phone] if you have not heard about the biopsies in 3 weeks.    SIGNATURES/CONFIDENTIALITY: You and/or your care partner have signed paperwork which will be entered into your electronic medical record.  These signatures attest to the fact that that the information above on your After Visit Summary has been reviewed and is understood.  Full responsibility of the confidentiality of this discharge information lies with you and/or your care-partner.

## 2015-01-31 NOTE — Op Note (Signed)
[place]  [address] [place], [id number]   ENDOSCOPY PROCEDURE REPORT  PATIENT: Tammy, Schmitt  MR#: [id number] BIRTHDATE: 1940/07/27 , 74  yrs. old GENDER: female ENDOSCOPIST: Eustace Quail, MD REFERRED BY:  .  Self / Office PROCEDURE DATE:  01/31/2015 PROCEDURE:  EGD w/ balloon dilation   - 64mm ASA CLASS:     Class II INDICATIONS:  dysphagia, history of esophageal reflux, and epigastric pain. MEDICATIONS: Monitored anesthesia care and Propofol 230 mg IV TOPICAL ANESTHETIC: none  DESCRIPTION OF PROCEDURE: After the risks benefits and alternatives of the procedure were thoroughly explained, informed consent was obtained.  The LB [id number] endoscope was introduced through the mouth and advanced to the second portion of the duodenum , Without limitations.  The instrument was slowly withdrawn as the mucosa was fully examined.  EXAM:The esophagus was foreshortened and slightly tortuous.  There was a ringlike stricture measuring approximately 15 mm gastroesophageal junction (31 cm from the incisors).  No active inflammation.  There was a 6 cm sliding hernia.  Stomach was otherwise normal.  The duodenum was normal.  Retroflexed views revealed a hiatal hernia. Therapy: A TTS balloon was passed with the endoscope. The stricture was dilated to a diameter of 18 mm. Moderate resistance. No heme. Tolerated well    The scope was then withdrawn from the patient and the procedure completed. COMPLICATIONS: There were no immediate complications.  ENDOSCOPIC IMPRESSION: 1. 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 No obvious explanation for pain  RECOMMENDATIONS: 1.  Anti-reflux regimen to be followed 2.  Clear liquids until 5 PM, then soft foods rest of day.  Resume prior diet tomorrow. 3.  Continue omeprazole twice daily for control of reflux symptoms 4.  My office will schedule a contrast enhanced CT scan of the abdomen "upper  abdominal pain, evaluate" 5. Initiate care with your new PCP as previously recommended  REPEAT EXAM:  eSigned:  Eustace Quail, MD 01/31/2015 3:07 PM    CC:The Patient

## 2015-01-31 NOTE — Progress Notes (Signed)
To recovery, report to Myers, RN, VSS. 

## 2015-01-31 NOTE — Progress Notes (Signed)
Called to room to assist during endoscopic procedure.  Patient ID and intended procedure confirmed with present staff. Received instructions for my participation in the procedure from the performing physician.  

## 2015-02-01 ENCOUNTER — Telehealth: Payer: Self-pay

## 2015-02-01 NOTE — Telephone Encounter (Signed)
  Follow up Call-  Call back number 01/31/2015  Post procedure Call Back phone  # [phone]  Permission to leave phone message Yes     Patient questions:  Do you have a fever, pain , or abdominal swelling? No. Pain Score  0 *  Have you tolerated food without any problems? Yes.    Have you been able to return to your normal activities? Yes.    Do you have any questions about your discharge instructions: Diet   No. Medications  No. Follow up visit  No.  Do you have questions or concerns about your Care? No.  Actions: * If pain score is 4 or above: No action needed, pain <4.

## 2015-02-01 NOTE — Telephone Encounter (Signed)
Pt scheduled for CT of abd at [facility] 02/09/15@10 :30am. Pt to arrive there at 10:15am be NPO after 6:30am, drink bottle 1 of contrast at 8:30, bottle 2 at 9:30am. Pt aware of appt.

## 2015-02-08 ENCOUNTER — Other Ambulatory Visit: Payer: Medicare HMO

## 2015-02-09 ENCOUNTER — Ambulatory Visit (HOSPITAL_COMMUNITY)
Admission: RE | Admit: 2015-02-09 | Discharge: 2015-02-09 | Disposition: A | Discharge status: Home / Self Care | Payer: Medicare HMO | Source: Ambulatory Visit | Attending: Internal Medicine | Admitting: Internal Medicine

## 2015-02-09 ENCOUNTER — Encounter (HOSPITAL_COMMUNITY): Payer: Self-pay

## 2015-02-09 DIAGNOSIS — C480 Malignant neoplasm of retroperitoneum: Secondary | ICD-10-CM | POA: Insufficient documentation

## 2015-02-09 DIAGNOSIS — R1010 Upper abdominal pain, unspecified: Secondary | ICD-10-CM | POA: Diagnosis present

## 2015-02-09 DIAGNOSIS — K769 Liver disease, unspecified: Secondary | ICD-10-CM | POA: Insufficient documentation

## 2015-02-09 MED ORDER — IOHEXOL 300 MG/ML  SOLN
100.0000 mL | Freq: Once | INTRAMUSCULAR | Status: AC | PRN
  Administered 2015-02-09: 100 mL via INTRAVENOUS

## 2015-02-12 ENCOUNTER — Encounter: Payer: Self-pay | Admitting: Cardiovascular Disease

## 2015-02-12 ENCOUNTER — Other Ambulatory Visit: Payer: Self-pay

## 2015-02-12 ENCOUNTER — Ambulatory Visit (INDEPENDENT_AMBULATORY_CARE_PROVIDER_SITE_OTHER): Payer: Medicare HMO | Admitting: Cardiovascular Disease

## 2015-02-12 VITALS — BP 131/76 | HR 66 | Ht <= 58 in | Wt 152.2 lb

## 2015-02-12 DIAGNOSIS — I2510 Atherosclerotic heart disease of native coronary artery without angina pectoris: Secondary | ICD-10-CM | POA: Diagnosis not present

## 2015-02-12 DIAGNOSIS — I739 Peripheral vascular disease, unspecified: Secondary | ICD-10-CM

## 2015-02-12 DIAGNOSIS — I779 Disorder of arteries and arterioles, unspecified: Secondary | ICD-10-CM

## 2015-02-12 DIAGNOSIS — E785 Hyperlipidemia, unspecified: Secondary | ICD-10-CM

## 2015-02-12 DIAGNOSIS — K668 Other specified disorders of peritoneum: Secondary | ICD-10-CM

## 2015-02-12 DIAGNOSIS — I25119 Atherosclerotic heart disease of native coronary artery with unspecified angina pectoris: Secondary | ICD-10-CM

## 2015-02-12 NOTE — Patient Instructions (Signed)
Medication Instructions:  Your physician recommends that you continue on your current medications as directed. Please refer to the Current Medication list given to you today.   Labwork: Your physician recommends that you return for lab work in: 6 months on the day of or a few days before your office visit with Dr. Nahser.  You will need to FAST for this appointment - nothing to eat or drink after midnight the night before except water.    Testing/Procedures: Your physician has requested that you have a carotid duplex. This test is an ultrasound of the carotid arteries in your neck. It looks at blood flow through these arteries that supply the brain with blood. Allow one hour for this exam. There are no restrictions or special instructions.   Follow-Up: Your physician wants you to follow-up in: 6 months with Dr. Nahser.  You will receive a reminder letter in the mail two months in advance. If you don't receive a letter, please call our office to schedule the follow-up appointment.   If you need a refill on your cardiac medications before your next appointment, please call your pharmacy.   Thank you for choosing [facility]! Ellana Kawa, RN [phone]    

## 2015-02-12 NOTE — Progress Notes (Signed)
Cardiology Office Note   Date:  02/12/2015   ID:  Tammy Schmitt, DOB [birth year], MRN [id number]  PCP:  Shirline Frees, MD  Cardiologist:   Thayer Headings, MD   Chief Complaint  Patient presents with  . Coronary Artery Disease   Problem list: 1. Coronary artery disease-status post coronary artery bypass grafting status post RIMA to the right coronary artery and saphenous vein graft to the posterior lateral circumflex proximal artery in 2002 2. Hyperlipidemia:  3. Mild carotid artery disease:  4. Mild chronic systolic congestive heart failure 5. Hx of breast cancer ( left ) , s/p XRT and chemo and surgery /    History of Present Illness: Tammy Schmitt is a 74 y.o. female who presents to establish care for her CAD.  Moved from [place] this past spring  Has had some indigestion / heartburn.  No angina .  Relieved with rolaids. BP has been higher recently  Diet is about the same. Has joined the  [facility] but has not been exercising. Thinks she can do better with her nutrition .  Not able to walk far  -    Dec. 19. 2016: Doing well Seeing Dr. Henrene Pastor for some abdominal issues  No CP or dyspnea.   Past Medical History  Diagnosis Date  . Cataract   . CHF (congestive heart failure) ([place])   . GERD (gastroesophageal reflux disease)   . Myocardial infarction ([place]) 2001  . CAD (coronary artery disease)   . HLD (hyperlipidemia)   . Diverticulitis   . Colon polyp   . Hypertension   . Hemorrhoids   . Obesity   . Cancer [facility])     breast    Past Surgical History  Procedure Laterality Date  . Breast surgery Left     lymph nodes removed also  . Eye surgery Bilateral     cataracts removed  . Ovary surgery      one  tube removed , one ovary trimmed down  . Heart bypass      x 2, stent     Current Outpatient Prescriptions  Medication Sig Dispense Refill  . acetaminophen (TYLENOL) 500 MG tablet Take 500 mg by mouth at bedtime.    Marland Kitchen aspirin EC 81 MG tablet  Take 1 tablet (81 mg total) by mouth daily.    Marland Kitchen esomeprazole (NEXIUM) 40 MG capsule TAKE 1 CAPSULE (40 MG) BY MOUTH DAILY (Patient taking differently: TAKE 1 CAPSULE (40 MG) BY MOUTH DAILY at bedtime) 30 capsule 0  . LORazepam (ATIVAN) 0.5 MG tablet Take 0.5 mg by mouth every 8 (eight) hours. TAKES 1/2 TABLET BY MOUTH DAILY AT 9:20AM.    . losartan (COZAAR) 50 MG tablet Take 1 tablet (50 mg total) by mouth daily. 31 tablet 11  . metoprolol tartrate (LOPRESSOR) 25 MG tablet Take 0.5 tablets (12.5 mg total) by mouth 2 (two) times daily. 90 tablet 3  . nitroGLYCERIN (NITROSTAT) 0.4 MG SL tablet Place 0.4 mg under the tongue every 5 (five) minutes as needed for chest pain.    Marland Kitchen omeprazole (PRILOSEC) 40 MG capsule Take 1 capsule (40 mg total) by mouth 2 (two) times daily. 60 capsule 11   No current facility-administered medications for this visit.    Allergies:   Review of patient's allergies indicates no known allergies.    Social History:  The patient  reports that she quit smoking about 15 years ago. Her smoking use included Cigarettes. She has never used smokeless tobacco.  She reports that she drinks about 1.8 oz of alcohol per week. She reports that she does not use illicit drugs.   Family History:  The patient's family history includes Breast cancer (age of onset: 8) in her mother; Esophageal cancer (age of onset: 76) in her daughter; Heart disease in her brother and father; Leukemia (age of onset: 75) in her son. There is no history of Colon cancer or Stomach cancer.    ROS:  Please see the history of present illness.    Review of Systems: Constitutional:  denies fever, chills, diaphoresis, appetite change and fatigue.  HEENT: denies photophobia, eye pain, redness, hearing loss, ear pain, congestion, sore throat, rhinorrhea, sneezing, neck pain, neck stiffness and tinnitus.  Respiratory: admits to  DOE,    Cardiovascular: denies chest pain, palpitations and leg swelling.    Gastrointestinal: denies nausea, vomiting, abdominal pain, diarrhea, constipation, blood in stool.  Genitourinary: denies dysuria, urgency, frequency, hematuria, flank pain and difficulty urinating.  Musculoskeletal: denies  myalgias, back pain, joint swelling, arthralgias and gait problem.   Skin: denies pallor, rash and wound.  Neurological: denies dizziness, seizures, syncope, weakness, light-headedness, numbness and headaches.   Hematological: denies adenopathy, easy bruising, personal or family bleeding history.  Psychiatric/ Behavioral: denies suicidal ideation, mood changes, confusion, nervousness, sleep disturbance and agitation.       All other systems are reviewed and negative.    PHYSICAL EXAM: VS:  BP 131/76 mmHg  Pulse 66  Ht 4\' 10"  (1.473 m)  Wt 152 lb 3.2 oz (69.037 kg)  BMI 31.82 kg/m2 , BMI Body mass index is 31.82 kg/(m^2). GEN: Well nourished, well developed, in no acute distress HEENT: normal Neck: no JVD, carotid bruits, or masses Cardiac: RRR; no murmurs, rubs, or gallops,no edema  Respiratory:  clear to auscultation bilaterally, normal work of breathing GI: soft, nontender, nondistended, + BS MS: no deformity or atrophy Skin: warm and dry, no rash Neuro:  Strength and sensation are intact Psych: normal   EKG:  EKG is ordered today. The ekg ordered today demonstrates  NSR at 60, NS ST abn.     Recent Labs: 11/15/2014: Hemoglobin 14.3 01/31/2015: ALT 8; BUN 13; Creatinine, Ser 0.52; Potassium 4.4; Sodium 141    Lipid Panel    Component Value Date/Time   CHOL 180 01/31/2015 1551   TRIG 147.0 01/31/2015 1551   HDL 41.00 01/31/2015 1551   CHOLHDL 4 01/31/2015 1551   VLDL 29.4 01/31/2015 1551   LDLCALC 109* 01/31/2015 1551      Wt Readings from Last 3 Encounters:  02/12/15 152 lb 3.2 oz (69.037 kg)  01/31/15 154 lb (69.854 kg)  01/24/15 154 lb (69.854 kg)      Other studies Reviewed: Additional studies/ records that were reviewed today  include: . Review of the above records demonstrates:    ASSESSMENT AND PLAN:  1.  CAD - she status post coronary artery bypass grafting. She's not having any episodes of angina. We'll check fasting lipids today. We'll send her to a nutritionist.  encouraged her to exercise.  2. Essential Hypertension:  Will need to better on her diet and exercise program. We will start losartan 50 mg a day. We'll check a basic medical profile in 3 weeks.  3. Hyperlipidemia :  Intolerant to statins. Does not tolerate statins very well.   Has tried Atorvastatin - but developed muscle aches.   Has tried other statins but had a similar effect  Tolerated Zetia but cannot afford it  She  will work on her diet, exercise, and weight loss program. I'll see her again in 6 months. We'll check labs at that time.  Current medicines are reviewed at length with the patient today.  The patient has concerns regarding medicines.  The following changes have been made:  no change  Labs/ tests ordered today include:  No orders of the defined types were placed in this encounter.     Disposition:   FU with me in 6 months      Amandalynn Pitz, Wonda Cheng, MD  02/12/2015 11:12 AM    [facility] [place], [place], [place]  [address] Phone: [phone]; Fax: [phone]   [facility]  [address] [place], [place]  [address] [phone]   Fax [phone]

## 2015-02-21 ENCOUNTER — Other Ambulatory Visit: Payer: Self-pay | Admitting: Radiology

## 2015-02-21 ENCOUNTER — Ambulatory Visit (INDEPENDENT_AMBULATORY_CARE_PROVIDER_SITE_OTHER): Payer: Medicare HMO | Admitting: Adult Health

## 2015-02-21 ENCOUNTER — Encounter: Payer: Self-pay | Admitting: Adult Health

## 2015-02-21 VITALS — BP 130/84 | HR 83 | Temp 98.0°F | Ht <= 58 in | Wt 150.8 lb

## 2015-02-21 DIAGNOSIS — Z7189 Other specified counseling: Secondary | ICD-10-CM | POA: Diagnosis not present

## 2015-02-21 DIAGNOSIS — Z23 Encounter for immunization: Secondary | ICD-10-CM

## 2015-02-21 DIAGNOSIS — Z7689 Persons encountering health services in other specified circumstances: Secondary | ICD-10-CM

## 2015-02-21 NOTE — Progress Notes (Signed)
HPI:  Tammy Schmitt is here to establish care. She is a pleasant caucasian female who  has a past medical history of Cataract; CHF (congestive heart failure) ([place]); GERD (gastroesophageal reflux disease); Myocardial infarction [facility]) (2001); CAD (coronary artery disease); HLD (hyperlipidemia); Diverticulitis; Colon polyp; Hypertension; Hemorrhoids; Obesity; Cancer ([place]) (2000); and DDD (degenerative disc disease), lumbar.  Last PCP and physical: February 2016 - In [place] Is followed by: Cardiologist  - sees every six months GI GYN - as needed  Has the following chronic problems that require follow up and concerns today:  Generalized abdominal pain She has been seen by GI as a self referral and has an appointment tomorrow for biopsy. 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 There is a family history of esophageal cancer in her daughter.   ROS negative for unless reported above: fevers, chills,feeling poorly, unintentional weight loss, hearing or vision loss, chest pain, palpitations, leg claudication, struggling to breath,Not feeling congested in the chest, no orthopenia, no cough,no wheezing, normal appetite, no soft tissue swelling, no hemoptysis, melena, hematochezia, hematuria, falls, loc, si, or thoughts of self harm.  Immunizations:UTD Diet: " It is terrible" She does not follow a specific diet.  Exercise: She likes to exercise but has not been able to.  Colonoscopy: 2013 - five year plan Dexa: 2011  Mammogram: 2016 - Normal  Past Medical History  Diagnosis Date  . Cataract   . CHF (congestive heart failure) ([place])   . GERD (gastroesophageal reflux disease)   . Myocardial infarction ([place]) 2001  . CAD (coronary artery disease)   . HLD (hyperlipidemia)   . Diverticulitis   . Colon polyp   . Hypertension   . Hemorrhoids   . Obesity   . Cancer [facility])      breast    Past Surgical History  Procedure Laterality Date  . Breast surgery Left     lymph nodes removed also  . Eye surgery Bilateral     cataracts removed  . Ovary surgery      one  tube removed , one ovary trimmed down  . Heart bypass      x 2, stent    Family History  Problem Relation Age of Onset  . Breast cancer Mother 79  . Heart disease Father   . Heart disease Brother   . Esophageal cancer Daughter 38    died at 31  . Leukemia Son 41  . Colon cancer Neg Hx   . Stomach cancer Neg Hx     Social History   Social History  . Marital Status: Divorced    Spouse Name: N/A  . Number of Children: 3  . Years of Education: N/A   Occupational History  . retired    Social History Main Topics  . Smoking status: Former Smoker    Types: Cigarettes    Quit date: 12/26/1999  . Smokeless tobacco: Never Used  . Alcohol Use: 1.8 oz/week    3 Standard drinks or equivalent per week     Comment: occ.  . Drug Use: No  . Sexual Activity: Not Asked   Other Topics Concern  . None   Social History Narrative     Current outpatient prescriptions:  .  acetaminophen (TYLENOL) 500 MG tablet, Take 1,000 mg by mouth 2 (two) times daily as needed for headache. , Disp: , Rfl:  .  aspirin EC  81 MG tablet, Take 1 tablet (81 mg total) by mouth daily., Disp: , Rfl:  .  budesonide-formoterol (SYMBICORT) 160-4.5 MCG/ACT inhaler, Inhale 2 puffs into the lungs 2 (two) times daily as needed (for bronchitis)., Disp: , Rfl:  .  diphenhydramine-acetaminophen (TYLENOL PM) 25-500 MG TABS tablet, Take 1 tablet by mouth at bedtime., Disp: , Rfl:  .  ibuprofen (ADVIL,MOTRIN) 200 MG tablet, Take 400 mg by mouth 2 (two) times daily as needed for moderate pain., Disp: , Rfl:  .  LORazepam (ATIVAN) 0.5 MG tablet, Take 0.25 mg by mouth daily as needed for anxiety. , Disp: , Rfl:  .  losartan (COZAAR) 50 MG tablet, Take 1 tablet (50 mg total) by mouth daily., Disp: 31 tablet, Rfl: 11 .  metoprolol  tartrate (LOPRESSOR) 25 MG tablet, Take 0.5 tablets (12.5 mg total) by mouth 2 (two) times daily., Disp: 90 tablet, Rfl: 3 .  nitroGLYCERIN (NITROSTAT) 0.4 MG SL tablet, Place 0.4 mg under the tongue every 5 (five) minutes as needed for chest pain., Disp: , Rfl:  .  omeprazole (PRILOSEC) 40 MG capsule, Take 1 capsule (40 mg total) by mouth 2 (two) times daily., Disp: 60 capsule, Rfl: 11 .  Polyethyl Glycol-Propyl Glycol (SYSTANE OP), Apply 1 drop to eye daily as needed (for dry eyes)., Disp: , Rfl:  .  Polyvinyl Alcohol-Povidone (REFRESH OP), Apply 1 drop to eye daily as needed (for dry eyes)., Disp: , Rfl:   EXAM:  Filed Vitals:   02/21/15 1255  BP: 130/84  Pulse: 83  Temp: 98 F (36.7 C)    Body mass index is 31.53 kg/(m^2).  GENERAL: vitals reviewed and listed above, alert, oriented, appears well hydrated and in no acute distress  HEENT: atraumatic, conjunttiva clear, no obvious abnormalities on inspection of external nose and ears  NECK: Neck is soft and supple without masses, no adenopathy or thyromegaly, trachea midline, no JVD. Normal range of motion.   LUNGS: clear to auscultation bilaterally, no wheezes, rales or rhonchi, good air movement  CV: Regular rate and rhythm, normal S1/S2, no audible murmurs, gallops, or rubs. No carotid bruit and no peripheral edema.   MS: moves all extremities without noticeable abnormality. No edema noted  Abd: soft//nondistended/normal bowel sounds. Tender in generalized upper abdomen with palpation.   Skin: warm and dry, no rash   Extremities: No clubbing, cyanosis, or edema. Capillary refill is WNL. Pulses intact bilaterally in upper and lower extremities.   Neuro: CN II-XII intact, sensation and reflexes normal throughout, 5/5 muscle strength in bilateral upper and lower extremities. Normal finger to nose. Normal rapid alternating movements. Normal romberg. No pronator drift.   PSYCH: pleasant and cooperative, no obvious depression or  anxiety  ASSESSMENT AND PLAN:  1. Encounter to establish care - Follow up in February for CPE - Follow up sooner if needed - Will follow up with once biopsy has resulted.   2. Encounter for immunization - Flu shot given    -We reviewed the PMH, PSH, FH, SH, Meds and Allergies. -We provided refills for any medications we will prescribe as needed. -We addressed current concerns per orders and patient instructions. -We have asked for records for pertinent exams, studies, vaccines and notes from previous providers. -We have advised patient to follow up per instructions below.   -Patient advised to return or notify a provider immediately if symptoms worsen or persist or new concerns arise.   [name], AGNP

## 2015-02-21 NOTE — Patient Instructions (Addendum)
It was great meeting you today!  Follow up with me when it is convenient for your next physical.   If you need anything in the meantime, please let me know.   I wish you the best of luck tomorrow.     Menopause is a normal process in which your reproductive ability comes to an end. This process happens gradually over a span of months to years, usually between the ages of 41 and 24. Menopause is complete when you have missed 12 consecutive menstrual periods. It is important to talk with your health care provider about some of the most common conditions that affect postmenopausal women, such as heart disease, cancer, and bone loss (osteoporosis). Adopting a healthy lifestyle and getting preventive care can help to promote your health and wellness. Those actions can also lower your chances of developing some of these common conditions. WHAT SHOULD I KNOW ABOUT MENOPAUSE? During menopause, you may experience a number of symptoms, such as:  Moderate-to-severe hot flashes.  Night sweats.  Decrease in sex drive.  Mood swings.  Headaches.  Tiredness.  Irritability.  Memory problems.  Insomnia. Choosing to treat or not to treat menopausal changes is an individual decision that you make with your health care provider. WHAT SHOULD I KNOW ABOUT HORMONE REPLACEMENT THERAPY AND SUPPLEMENTS? Hormone therapy products are effective for treating symptoms that are associated with menopause, such as hot flashes and night sweats. Hormone replacement carries certain risks, especially as you become older. If you are thinking about using estrogen or estrogen with progestin treatments, discuss the benefits and risks with your health care provider. WHAT SHOULD I KNOW ABOUT HEART DISEASE AND STROKE? Heart disease, heart attack, and stroke become more likely as you age. This may be due, in part, to the hormonal changes that your body experiences during menopause. These can affect how your body processes  dietary fats, triglycerides, and cholesterol. Heart attack and stroke are both medical emergencies. There are many things that you can do to help prevent heart disease and stroke:  Have your blood pressure checked at least every 1-2 years. High blood pressure causes heart disease and increases the risk of stroke.  If you are 47-82 years old, ask your health care provider if you should take aspirin to prevent a heart attack or a stroke.  Do not use any tobacco products, including cigarettes, chewing tobacco, or electronic cigarettes. If you need help quitting, ask your health care provider.  It is important to eat a healthy diet and maintain a healthy weight.  Be sure to include plenty of vegetables, fruits, low-fat dairy products, and lean protein.  Avoid eating foods that are high in solid fats, added sugars, or salt (sodium).  Get regular exercise. This is one of the most important things that you can do for your health.  Try to exercise for at least 150 minutes each week. The type of exercise that you do should increase your heart rate and make you sweat. This is known as moderate-intensity exercise.  Try to do strengthening exercises at least twice each week. Do these in addition to the moderate-intensity exercise.  Know your numbers.Ask your health care provider to check your cholesterol and your blood glucose. Continue to have your blood tested as directed by your health care provider. WHAT SHOULD I KNOW ABOUT CANCER SCREENING? There are several types of cancer. Take the following steps to reduce your risk and to catch any cancer development as early as possible. Breast Cancer  Practice  breast self-awareness.  This means understanding how your breasts normally appear and feel.  It also means doing regular breast self-exams. Let your health care provider know about any changes, no matter how small.  If you are 72 or older, have a clinician do a breast exam (clinical breast exam  or CBE) every year. Depending on your age, family history, and medical history, it may be recommended that you also have a yearly breast X-ray (mammogram).  If you have a family history of breast cancer, talk with your health care provider about genetic screening.  If you are at high risk for breast cancer, talk with your health care provider about having an MRI and a mammogram every year.  Breast cancer (BRCA) gene test is recommended for women who have family members with BRCA-related cancers. Results of the assessment will determine the need for genetic counseling and BRCA1 and for BRCA2 testing. BRCA-related cancers include these types:  Breast. This occurs in males or females.  Ovarian.  Tubal. This may also be called fallopian tube cancer.  Cancer of the abdominal or pelvic lining (peritoneal cancer).  Prostate.  Pancreatic. Cervical, Uterine, and Ovarian Cancer Your health care provider may recommend that you be screened regularly for cancer of the pelvic organs. These include your ovaries, uterus, and vagina. This screening involves a pelvic exam, which includes checking for microscopic changes to the surface of your cervix (Pap test).  For women ages 21-65, health care providers may recommend a pelvic exam and a Pap test every three years. For women ages 57-65, they may recommend the Pap test and pelvic exam, combined with testing for human papilloma virus (HPV), every five years. Some types of HPV increase your risk of cervical cancer. Testing for HPV may also be done on women of any age who have unclear Pap test results.  Other health care providers may not recommend any screening for nonpregnant women who are considered low risk for pelvic cancer and have no symptoms. Ask your health care provider if a screening pelvic exam is right for you.  If you have had past treatment for cervical cancer or a condition that could lead to cancer, you need Pap tests and screening for cancer  for at least 20 years after your treatment. If Pap tests have been discontinued for you, your risk factors (such as having a new sexual partner) need to be reassessed to determine if you should start having screenings again. Some women have medical problems that increase the chance of getting cervical cancer. In these cases, your health care provider may recommend that you have screening and Pap tests more often.  If you have a family history of uterine cancer or ovarian cancer, talk with your health care provider about genetic screening.  If you have vaginal bleeding after reaching menopause, tell your health care provider.  There are currently no reliable tests available to screen for ovarian cancer. Lung Cancer Lung cancer screening is recommended for adults 70-16 years old who are at high risk for lung cancer because of a history of smoking. A yearly low-dose CT scan of the lungs is recommended if you:  Currently smoke.  Have a history of at least 30 pack-years of smoking and you currently smoke or have quit within the past 15 years. A pack-year is smoking an average of one pack of cigarettes per day for one year. Yearly screening should:  Continue until it has been 15 years since you quit.  Stop if you develop  a health problem that would prevent you from having lung cancer treatment. Colorectal Cancer  This type of cancer can be detected and can often be prevented.  Routine colorectal cancer screening usually begins at age 55 and continues through age 32.  If you have risk factors for colon cancer, your health care provider may recommend that you be screened at an earlier age.  If you have a family history of colorectal cancer, talk with your health care provider about genetic screening.  Your health care provider may also recommend using home test kits to check for hidden blood in your stool.  A small camera at the end of a tube can be used to examine your colon directly  (sigmoidoscopy or colonoscopy). This is done to check for the earliest forms of colorectal cancer.  Direct examination of the colon should be repeated every 5-10 years until age 74. However, if early forms of precancerous polyps or small growths are found or if you have a family history or genetic risk for colorectal cancer, you may need to be screened more often. Skin Cancer  Check your skin from head to toe regularly.  Monitor any moles. Be sure to tell your health care provider:  About any new moles or changes in moles, especially if there is a change in a mole's shape or color.  If you have a mole that is larger than the size of a pencil eraser.  If any of your family members has a history of skin cancer, especially at a young age, talk with your health care provider about genetic screening.  Always use sunscreen. Apply sunscreen liberally and repeatedly throughout the day.  Whenever you are outside, protect yourself by wearing long sleeves, pants, a wide-brimmed hat, and sunglasses. WHAT SHOULD I KNOW ABOUT OSTEOPOROSIS? Osteoporosis is a condition in which bone destruction happens more quickly than new bone creation. After menopause, you may be at an increased risk for osteoporosis. To help prevent osteoporosis or the bone fractures that can happen because of osteoporosis, the following is recommended:  If you are 71-68 years old, get at least 1,000 mg of calcium and at least 600 mg of vitamin D per day.  If you are older than age 28 but younger than age 39, get at least 1,200 mg of calcium and at least 600 mg of vitamin D per day.  If you are older than age 61, get at least 1,200 mg of calcium and at least 800 mg of vitamin D per day. Smoking and excessive alcohol intake increase the risk of osteoporosis. Eat foods that are rich in calcium and vitamin D, and do weight-bearing exercises several times each week as directed by your health care provider. WHAT SHOULD I KNOW ABOUT HOW  MENOPAUSE AFFECTS [place]? Depression may occur at any age, but it is more common as you become older. Common symptoms of depression include:  Low or sad mood.  Changes in sleep patterns.  Changes in appetite or eating patterns.  Feeling an overall lack of motivation or enjoyment of activities that you previously enjoyed.  Frequent crying spells. Talk with your health care provider if you think that you are experiencing depression. WHAT SHOULD I KNOW ABOUT IMMUNIZATIONS? It is important that you get and maintain your immunizations. These include:  Tetanus, diphtheria, and pertussis (Tdap) booster vaccine.  Influenza every year before the flu season begins.  Pneumonia vaccine.  Shingles vaccine. Your health care provider may also recommend other immunizations.  This information is not intended to replace advice given to you by your health care provider. Make sure you discuss any questions you have with your health care provider.   Document Released: 04/04/2005 Document Revised: 03/03/2014 Document Reviewed: 10/13/2013 Elsevier Interactive Patient Education [employer].

## 2015-02-22 ENCOUNTER — Encounter (HOSPITAL_COMMUNITY): Payer: Self-pay

## 2015-02-22 ENCOUNTER — Ambulatory Visit (HOSPITAL_COMMUNITY)
Admission: RE | Admit: 2015-02-22 | Discharge: 2015-02-22 | Disposition: A | Discharge status: Home / Self Care | Payer: Medicare HMO | Source: Ambulatory Visit | Attending: Internal Medicine | Admitting: Internal Medicine

## 2015-02-22 DIAGNOSIS — E785 Hyperlipidemia, unspecified: Secondary | ICD-10-CM | POA: Diagnosis not present

## 2015-02-22 DIAGNOSIS — I2510 Atherosclerotic heart disease of native coronary artery without angina pectoris: Secondary | ICD-10-CM | POA: Insufficient documentation

## 2015-02-22 DIAGNOSIS — N838 Other noninflammatory disorders of ovary, fallopian tube and broad ligament: Secondary | ICD-10-CM | POA: Diagnosis not present

## 2015-02-22 DIAGNOSIS — K668 Other specified disorders of peritoneum: Secondary | ICD-10-CM | POA: Diagnosis present

## 2015-02-22 DIAGNOSIS — K8681 Exocrine pancreatic insufficiency: Secondary | ICD-10-CM | POA: Diagnosis not present

## 2015-02-22 DIAGNOSIS — Z7982 Long term (current) use of aspirin: Secondary | ICD-10-CM | POA: Diagnosis not present

## 2015-02-22 DIAGNOSIS — Z79899 Other long term (current) drug therapy: Secondary | ICD-10-CM | POA: Diagnosis not present

## 2015-02-22 LAB — CBC
HCT: 41.8 % (ref 36.0–46.0)
HEMOGLOBIN: 13.6 g/dL (ref 12.0–15.0)
MCH: 28.6 pg (ref 26.0–34.0)
MCHC: 32.5 g/dL (ref 30.0–36.0)
MCV: 88 fL (ref 78.0–100.0)
Platelets: 283 10*3/uL (ref 150–400)
RBC: 4.75 MIL/uL (ref 3.87–5.11)
RDW: 12.9 % (ref 11.5–15.5)
WBC: 7.8 10*3/uL (ref 4.0–10.5)

## 2015-02-22 LAB — PROTIME-INR
INR: 1.08 (ref 0.00–1.49)
PROTHROMBIN TIME: 14.2 s (ref 11.6–15.2)

## 2015-02-22 LAB — APTT: aPTT: 28 seconds (ref 24–37)

## 2015-02-22 MED ORDER — LIDOCAINE HCL 1 % IJ SOLN
INTRAMUSCULAR | Status: AC
  Filled 2015-02-22: qty 20

## 2015-02-22 MED ORDER — LIDOCAINE-EPINEPHRINE 1 %-1:100000 IJ SOLN
INTRAMUSCULAR | Status: AC
  Filled 2015-02-22: qty 1

## 2015-02-22 MED ORDER — FENTANYL CITRATE (PF) 100 MCG/2ML IJ SOLN
INTRAMUSCULAR | Status: AC | PRN
  Administered 2015-02-22 (×4): 50 ug via INTRAVENOUS

## 2015-02-22 MED ORDER — FENTANYL CITRATE (PF) 100 MCG/2ML IJ SOLN
INTRAMUSCULAR | Status: AC
  Filled 2015-02-22: qty 4

## 2015-02-22 MED ORDER — MIDAZOLAM HCL 2 MG/2ML IJ SOLN
INTRAMUSCULAR | Status: AC
  Filled 2015-02-22: qty 6

## 2015-02-22 MED ORDER — MIDAZOLAM HCL 2 MG/2ML IJ SOLN
INTRAMUSCULAR | Status: AC | PRN
  Administered 2015-02-22 (×4): 1 mg via INTRAVENOUS

## 2015-02-22 MED ORDER — SODIUM CHLORIDE 0.9 % IV SOLN: Freq: Once | INTRAVENOUS | Status: DC

## 2015-02-22 NOTE — Procedures (Signed)
Attempted CT guided biopsy of omental nodules.  Attempted to biopsy several nodules but unable to safely biopsy nodules due to small size and nodule mobility.  No immediate complication.  Minimal blood loss.

## 2015-02-22 NOTE — Sedation Documentation (Signed)
2 bandaids to L flank area intact.

## 2015-02-22 NOTE — H&P (Signed)
Chief Complaint: Patient was seen in consultation today for omental mass biopsy at the request of Perry,John N  Referring Physician(s): Perry,John N  History of Present Illness: Tammy Schmitt is a 74 y.o. female   Hx Breast Ca 2001 Pt has has epigastric pain for months Getting no better Was evaluated for same Endoscopy 12/7: neg  CT 02/09/15: 1. 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 There is a suspicious 6 mm lesion in segment 7 of the liver, potentially metastatic. In addition, there appears to be widespread intraperitoneal metastasis and a trace volume of ascites which may be malignant ascites. Correlation with EGD for further evaluation and biopsy is recommended at this time  Now scheduled for omental mass biopsy  Past Medical History  Diagnosis Date  . Cataract   . CHF (congestive heart failure) ([place])   . GERD (gastroesophageal reflux disease)   . Myocardial infarction ([place]) 2001  . CAD (coronary artery disease)   . HLD (hyperlipidemia)   . Diverticulitis   . Colon polyp   . Hypertension   . Hemorrhoids   . Obesity   . Cancer ([place]) 2000    breast  . DDD (degenerative disc disease), lumbar   . Prolapsed uterus     Past Surgical History  Procedure Laterality Date  . Breast surgery Left     lymph nodes removed also  . Eye surgery Bilateral     cataracts removed  . Ovary surgery      one  tube removed , one ovary trimmed down  . Heart bypass      x 2, stent    Allergies: Atorvastatin and Statins  Medications: Prior to Admission medications     Medication Sig Start Date End Date Taking? Authorizing Provider  acetaminophen (TYLENOL) 500 MG tablet Take 1,000 mg by mouth 2 (two) times daily as needed for headache.    Yes Historical Provider, MD  aspirin EC 81 MG tablet Take 1 tablet (81 mg total) by mouth daily. 11/08/14  Yes Thayer Headings, MD  diphenhydramine-acetaminophen (TYLENOL PM) 25-500 MG TABS tablet Take 1 tablet by mouth at bedtime.   Yes Historical Provider, MD  ibuprofen (ADVIL,MOTRIN) 200 MG tablet Take 400 mg by mouth 2 (two) times daily as needed for moderate pain.   Yes Historical Provider, MD  LORazepam (ATIVAN) 0.5 MG tablet Take 0.25 mg by mouth daily as needed for anxiety.    Yes Historical Provider, MD  losartan (COZAAR) 50 MG tablet Take 1 tablet (50 mg total) by mouth daily. 11/08/14  Yes Thayer Headings, MD  metoprolol tartrate (LOPRESSOR) 25 MG tablet Take 0.5 tablets (12.5 mg total) by mouth 2 (two) times daily. 09/28/14  Yes Shawnee Knapp, MD  nitroGLYCERIN (NITROSTAT) 0.4 MG SL tablet Place 0.4 mg under the tongue every 5 (five) minutes as needed for chest pain.   Yes Historical Provider, MD  omeprazole (PRILOSEC) 40 MG capsule Take 1 capsule (40 mg total) by mouth 2 (two) times daily. 01/24/15  Yes Irene Shipper, MD  Polyethyl Glycol-Propyl Glycol (SYSTANE OP) Apply 1 drop to eye daily as needed (for dry eyes).   Yes Historical Provider, MD  Polyvinyl Alcohol-Povidone (REFRESH OP) Apply 1 drop to eye daily as needed (for dry eyes).   Yes Historical Provider, MD  budesonide-formoterol (SYMBICORT) 160-4.5 MCG/ACT inhaler Inhale 2 puffs into the lungs 2 (two) times daily as needed (for bronchitis).    Historical Provider, MD     Family History  Problem Relation Age of Onset  . Breast cancer Mother 65  . Heart disease Father   . Heart disease Brother   . Esophageal cancer Daughter 19    died at 107  . Leukemia Son 61  . Colon cancer Neg Hx   . Stomach cancer Neg Hx     Social History   Social History  .  Marital Status: Divorced    Spouse Name: N/A  . Number of Children: 3  . Years of Education: N/A   Occupational History  . retired    Social History Main Topics  . Smoking status: Former Smoker    Types: Cigarettes    Quit date: 12/26/1999  . Smokeless tobacco: Never Used  . Alcohol Use: 1.8 oz/week    3 Standard drinks or equivalent per week     Comment: occ.  . Drug Use: No  . Sexual Activity: Not Asked   Other Topics Concern  . None   Social History Narrative   Retired from working with disabled individuals    Two sons and one daughter   She likes to Psychologist, occupational and exercise.     Review of Systems: A 12 point ROS discussed and pertinent positives are indicated in the HPI above.  All other systems are negative.  Review of Systems  Constitutional: Positive for appetite change. Negative for fever, activity change and fatigue.  HENT: Negative for trouble swallowing.   Respiratory: Negative for shortness of breath.   Gastrointestinal: Positive for abdominal pain. Negative for nausea.  Neurological: Negative for weakness.  Psychiatric/Behavioral: Negative for behavioral problems and confusion.    Vital Signs: BP 160/61 mmHg  Pulse 90  Temp(Src) 98 F (36.7 C) (Oral)  Resp 18  Ht 4\' 10"  (1.473 m)  Wt 150 lb (68.04 kg)  BMI 31.36 kg/m2  SpO2 97%  Physical Exam  Constitutional: She is oriented to person, place, and time.  Cardiovascular: Normal rate, regular rhythm and normal heart sounds.   Pulmonary/Chest: Effort normal and breath sounds normal. She has no wheezes.  Abdominal: Soft. There is tenderness.  Musculoskeletal: Normal range of motion.  Neurological: She is alert and oriented to person, place, and time.  Skin: Skin is warm and dry.  Psychiatric: She has a normal mood and affect. Her behavior is normal. Judgment and thought content normal.  Nursing note and vitals reviewed.   Mallampati Score:  MD Evaluation Airway: WNL Heart: WNL Abdomen:  WNL Chest/ Lungs: WNL ASA  Classification: 3 Mallampati/Airway Score: One  Imaging: Ct Abdomen W Contrast  02/09/2015  CLINICAL DATA:  74 year old female with 3-4 month history of epigastric pain. Left-sided breast cancer diagnosed in 2001 status post left lumpectomy. EXAM: CT ABDOMEN WITH CONTRAST TECHNIQUE: Multidetector CT imaging of the abdomen was performed using the standard protocol following bolus administration of intravenous contrast. CONTRAST:  134mL OMNIPAQUE IOHEXOL 300 MG/ML  SOLN COMPARISON:  No priors. FINDINGS: Lower chest: Large hiatal hernia. Cardiomegaly with left ventricular dilatation. Atherosclerotic calcifications in the right coronary artery. Status post median sternotomy. Hepatobiliary: Several sub cm low-attenuation lesions in the liver are too small to definitively characterize, but favored to represent tiny cysts. In segment 7 of the  liver there is also a less well-defined 6 mm intermediate attenuation area which is indeterminate (image 16 of series 2). No intra or extrahepatic biliary ductal dilatation. Gallbladder is nearly completely decompressed, but otherwise unremarkable in appearance. Pancreas: Immediately adjacent to or (more likely) emanating from the left side of the uncinate process of the pancreas there is a infiltrative appearing mass which measures approximately 4.0 x 3.5 x 4.2 cm (image 29 of series 2 and image 31 of series 602). This mass is very intimately associated with both the uncinate process of the pancreas, and the third/fourth portions of the duodenum. This mass also completely encases the superior mesenteric artery (best demonstrated on axial image 29 of series 2), and comes in direct contact with the superior mesenteric vein over approximately 1/3 of its circumference (also image 29 of series 2), with complete loss of the intervening fat plane. The mass is separate from the splenic vein, and comes in close proximity to but appears to be separate from  the left renal vein. The posterior aspect of the mass is in direct contact with the infrarenal abdominal aorta. No pancreatic ductal dilatation. The remainder the pancreas is otherwise normal in appearance. Spleen: Unremarkable. Adrenals/Urinary Tract: Bilateral adrenal glands and bilateral kidneys are normal in appearance. Stomach/Bowel: Intra abdominal portion of the stomach is normal. Specifically, the stomach does not appear distended. As discussed above, there is a mass in the retroperitoneum which appears intimately associated with the third and fourth portions of the duodenum (see above). No pathologic dilatation of the visualized portions of small bowel or colon. Vascular/Lymphatic: Extensive atherosclerosis in the abdominal vasculature, without evidence of aneurysm or dissection. Vascular involvement by retroperitoneal neoplasm, as discussed above. No lymphadenopathy noted in the abdomen. Borderline enlarged mesenteric lymph nodes measuring up to 9 mm in the root of the small bowel mesenteries are certainly suspicious, however, particularly given their proximity to the previously described mass. Other: Multiple tiny areas of soft tissue nodularity associated the omentum are highly concerning for intraperitoneal metastasis, largest of which is in the left upper quadrant (image 22 of series 2) measuring 12 x 9 mm. Trace volume of ascites most evident in the left pericolic gutter. Slight haziness throughout the omental fat. No pneumoperitoneum noted in the visualized peritoneal cavity. 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 Musculoskeletal: There are no aggressive appearing lytic or blastic lesions noted in the visualized portions of the skeleton. IMPRESSION: 1. 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 There is a suspicious 6 mm lesion in segment 7 of the liver, potentially metastatic. In addition, there appears to be widespread intraperitoneal metastasis and a trace volume of ascites which may be malignant ascites. Correlation with EGD for further evaluation and biopsy is recommended at this time. 2. Additional incidental findings, as above. These results will be called to the ordering clinician or representative by the Radiologist Assistant, and communication documented in the PACS or zVision Dashboard. Electronically Signed   By: Vinnie Langton M.D.   On: 02/09/2015 11:37   US Abdomen Complete  01/26/2015  CLINICAL DATA:  Gastroesophageal reflux. Esophagitis. Abdominal pain for 3 months. EXAM: ULTRASOUND ABDOMEN COMPLETE COMPARISON:  None. FINDINGS: Gallbladder: No gallstones or  wall thickening visualized. No sonographic Murphy sign noted. Common bile duct: Diameter: 3.9 mm Liver: 1.4 x 1 x 1.1 cm hypoechoic right hepatic mass with increased through transmission likely representing a cyst. Within normal limits in parenchymal echogenicity. IVC: No abnormality visualized. Pancreas: Limited visualization secondary to overlying bowel gas. Spleen: Size and appearance within normal limits. Right Kidney: Length: 10.5 cm. Echogenicity within normal limits. No mass or hydronephrosis visualized. Left Kidney: Length: 10.9 cm. Echogenicity within normal limits. No mass or hydronephrosis visualized. Abdominal aorta: No aneurysm visualized. Other findings: None. IMPRESSION: 1. No cholelithiasis or sonographic evidence of acute cholecystitis. Electronically Signed   By: Kathreen Devoid   On: 01/26/2015 15:37    Labs:  CBC:  Recent Labs  11/15/14 1538 02/22/15 0809  WBC 7.7 7.8  HGB 14.3 13.6  HCT 44.2 41.8  PLT  --  283     COAGS: No results for input(s): INR, APTT in the last 8760 hours.  BMP:  Recent Labs  11/08/14 1054 11/15/14 1515 11/29/14 0819 01/31/15 1548 01/31/15 1551  NA 137 139 136  --  141  K 4.1 4.0 4.0  --  4.4  CL 103 101 106  --  103  CO2 24 26 25   --  30  GLUCOSE 87 71 94  --  94  BUN 21 14 20 13 13   CALCIUM 9.5 9.5 9.2  --  9.6  CREATININE 0.54 0.58* 0.62 0.52 0.52    LIVER FUNCTION TESTS:  Recent Labs  11/08/14 1054 11/15/14 1515 01/31/15 1551  BILITOT 0.5 0.5 0.4  AST 17 21 16   ALT 9 11 8   ALKPHOS 77 88 89  PROT 7.1 7.4 7.0  ALBUMIN 4.1 4.6 3.9    TUMOR MARKERS: No results for input(s): AFPTM, CEA, CA199, CHROMGRNA in the last 8760 hours.  Assessment and Plan:  Epigastric pain for months Getting no better Evaluation reveals omental; pancreatic and liver lesions Now scheduled for omental mass biopsy Risks and Benefits discussed with the patient including, but not limited to bleeding, infection, damage to adjacent structures or low yield requiring additional tests. All of the patient's questions were answered, patient is agreeable to proceed. Consent signed and in chart.   Thank you for this interesting consult.  I greatly enjoyed meeting Tonita Cabebe and look forward to participating in their care.  A copy of this report was sent to the requesting provider on this date.  Signed: Jasai Sorg A 02/22/2015, 8:28 AM   I spent a total of  30 Minutes   in face to face in clinical consultation, greater than 50% of which was counseling/coordinating care for omental mass bx

## 2015-02-22 NOTE — Sedation Documentation (Signed)
Pt recovering at RN station. Will D/C when bedrest up and VS stable.

## 2015-02-22 NOTE — Sedation Documentation (Signed)
Unable to safely obtain biopsy. MD spoke to patient.

## 2015-02-23 ENCOUNTER — Telehealth: Payer: Self-pay

## 2015-02-23 ENCOUNTER — Other Ambulatory Visit: Payer: Self-pay

## 2015-02-23 DIAGNOSIS — K8689 Other specified diseases of pancreas: Secondary | ICD-10-CM

## 2015-02-23 NOTE — Telephone Encounter (Signed)
Pt has been scheduled for EUS on 03/01/15 1245 pm.  Instructions mailed to the home. Left message on machine to call back

## 2015-02-23 NOTE — Telephone Encounter (Signed)
-----   Message from Milus Banister, MD sent at 02/23/2015  7:26 AM EST ----- Regarding: RE: Next step Tammy Schmitt, EUS is best next step, less invasive and high likelihood of getting diagnosis here.  We'll get it set up.  Thanks.  Chevie Birkhead, She needs upper EUS, radial +/- linear, MAC for pancreatic mass.  Next Thursday (jan 5th).  Thanks   ----- Message -----    From: Irene Shipper, MD    Sent: 02/22/2015   4:26 PM      To: Milus Banister, MD Subject: Next step                                      Linna Hoff,  This is the patient that we spoke of recently regarding possible EUS. Unfortunately, CT-guided attempt at biopsy of omental lesions unsuccessful. See CT report. Question next step EUS versus diagnostic laparoscopy. Thoughts. Thanks  Jenny Reichmann

## 2015-02-23 NOTE — Telephone Encounter (Signed)
EUS scheduled, pt instructed and medications reviewed.  Patient instructions mailed to home.  Patient to call with any questions or concerns.  

## 2015-02-28 ENCOUNTER — Encounter (HOSPITAL_COMMUNITY): Payer: Self-pay | Admitting: *Deleted

## 2015-03-07 ENCOUNTER — Encounter: Payer: Self-pay | Admitting: Adult Health

## 2015-03-08 ENCOUNTER — Ambulatory Visit (HOSPITAL_COMMUNITY): Payer: Medicare HMO | Admitting: Certified Registered"

## 2015-03-08 ENCOUNTER — Ambulatory Visit (HOSPITAL_COMMUNITY)
Admission: RE | Admit: 2015-03-08 | Discharge: 2015-03-08 | Disposition: A | Discharge status: Home / Self Care | Payer: Medicare HMO | Source: Ambulatory Visit | Attending: Gastroenterology | Admitting: Gastroenterology

## 2015-03-08 ENCOUNTER — Encounter (HOSPITAL_COMMUNITY)
Admission: RE | Disposition: A | Discharge status: Home / Self Care | Payer: Self-pay | Source: Ambulatory Visit | Attending: Gastroenterology

## 2015-03-08 ENCOUNTER — Encounter (HOSPITAL_COMMUNITY): Payer: Self-pay

## 2015-03-08 DIAGNOSIS — Z7982 Long term (current) use of aspirin: Secondary | ICD-10-CM | POA: Insufficient documentation

## 2015-03-08 DIAGNOSIS — Z951 Presence of aortocoronary bypass graft: Secondary | ICD-10-CM | POA: Insufficient documentation

## 2015-03-08 DIAGNOSIS — K219 Gastro-esophageal reflux disease without esophagitis: Secondary | ICD-10-CM | POA: Insufficient documentation

## 2015-03-08 DIAGNOSIS — I2510 Atherosclerotic heart disease of native coronary artery without angina pectoris: Secondary | ICD-10-CM | POA: Diagnosis not present

## 2015-03-08 DIAGNOSIS — M1990 Unspecified osteoarthritis, unspecified site: Secondary | ICD-10-CM | POA: Diagnosis not present

## 2015-03-08 DIAGNOSIS — Z955 Presence of coronary angioplasty implant and graft: Secondary | ICD-10-CM | POA: Insufficient documentation

## 2015-03-08 DIAGNOSIS — K8689 Other specified diseases of pancreas: Secondary | ICD-10-CM

## 2015-03-08 DIAGNOSIS — Z79899 Other long term (current) drug therapy: Secondary | ICD-10-CM | POA: Diagnosis not present

## 2015-03-08 DIAGNOSIS — Z923 Personal history of irradiation: Secondary | ICD-10-CM | POA: Diagnosis not present

## 2015-03-08 DIAGNOSIS — E669 Obesity, unspecified: Secondary | ICD-10-CM | POA: Insufficient documentation

## 2015-03-08 DIAGNOSIS — Z9221 Personal history of antineoplastic chemotherapy: Secondary | ICD-10-CM | POA: Diagnosis not present

## 2015-03-08 DIAGNOSIS — I252 Old myocardial infarction: Secondary | ICD-10-CM | POA: Insufficient documentation

## 2015-03-08 DIAGNOSIS — I5022 Chronic systolic (congestive) heart failure: Secondary | ICD-10-CM | POA: Insufficient documentation

## 2015-03-08 DIAGNOSIS — E785 Hyperlipidemia, unspecified: Secondary | ICD-10-CM | POA: Insufficient documentation

## 2015-03-08 DIAGNOSIS — Z6831 Body mass index (BMI) 31.0-31.9, adult: Secondary | ICD-10-CM | POA: Diagnosis not present

## 2015-03-08 DIAGNOSIS — K869 Disease of pancreas, unspecified: Secondary | ICD-10-CM | POA: Diagnosis present

## 2015-03-08 DIAGNOSIS — Z806 Family history of leukemia: Secondary | ICD-10-CM | POA: Diagnosis not present

## 2015-03-08 DIAGNOSIS — J449 Chronic obstructive pulmonary disease, unspecified: Secondary | ICD-10-CM | POA: Insufficient documentation

## 2015-03-08 DIAGNOSIS — Z87891 Personal history of nicotine dependence: Secondary | ICD-10-CM | POA: Diagnosis not present

## 2015-03-08 DIAGNOSIS — Z853 Personal history of malignant neoplasm of breast: Secondary | ICD-10-CM | POA: Diagnosis not present

## 2015-03-08 DIAGNOSIS — I110 Hypertensive heart disease with heart failure: Secondary | ICD-10-CM | POA: Diagnosis not present

## 2015-03-08 DIAGNOSIS — K861 Other chronic pancreatitis: Secondary | ICD-10-CM | POA: Insufficient documentation

## 2015-03-08 DIAGNOSIS — Z800 Family history of malignant neoplasm of digestive organs: Secondary | ICD-10-CM | POA: Diagnosis not present

## 2015-03-08 HISTORY — PX: EUS: SHX5427

## 2015-03-08 SURGERY — UPPER ENDOSCOPIC ULTRASOUND (EUS) LINEAR
Anesthesia: Monitor Anesthesia Care

## 2015-03-08 MED ORDER — LIDOCAINE HCL (PF) 2 % IJ SOLN
INTRAMUSCULAR | Status: DC | PRN
  Administered 2015-03-08: 20 mg via INTRADERMAL

## 2015-03-08 MED ORDER — PROPOFOL 10 MG/ML IV BOLUS
INTRAVENOUS | Status: AC
  Filled 2015-03-08: qty 40

## 2015-03-08 MED ORDER — SODIUM CHLORIDE 0.9 % IV SOLN: INTRAVENOUS | Status: DC

## 2015-03-08 MED ORDER — PROPOFOL 10 MG/ML IV BOLUS
INTRAVENOUS | Status: DC | PRN
  Administered 2015-03-08 (×6): 50 mg via INTRAVENOUS
  Administered 2015-03-08 (×2): 100 mg via INTRAVENOUS
  Administered 2015-03-08 (×2): 50 mg via INTRAVENOUS

## 2015-03-08 MED ORDER — PROPOFOL 10 MG/ML IV BOLUS
INTRAVENOUS | Status: AC
  Filled 2015-03-08: qty 20

## 2015-03-08 MED ORDER — LACTATED RINGERS IV SOLN
INTRAVENOUS | Status: DC
  Administered 2015-03-08: 1000 mL via INTRAVENOUS

## 2015-03-08 NOTE — Discharge Instructions (Signed)

## 2015-03-08 NOTE — Transfer of Care (Signed)
Immediate Anesthesia Transfer of Care Note  Patient: Tammy Schmitt  Procedure(s) Performed: Procedure(s) with comments: UPPER ENDOSCOPIC ULTRASOUND (EUS) LINEAR (N/A) - radial linear  Patient Location: PACU  Anesthesia Type:MAC  Level of Consciousness:  sedated, patient cooperative and responds to stimulation  Airway & Oxygen Therapy:Patient Spontanous Breathing   Post-op Assessment:  Report given to PACU RN and Post -op Vital signs reviewed and stable  Post vital signs:  Reviewed and stable  Last Vitals:  Filed Vitals:   03/08/15 0745  BP: 156/52  Pulse: 66  Temp: 36.7 C  Resp: 13    Complications: No apparent anesthesia complications

## 2015-03-08 NOTE — Anesthesia Postprocedure Evaluation (Signed)
Anesthesia Post Note  Patient: Tammy Schmitt  Procedure(s) Performed: Procedure(s) (LRB): UPPER ENDOSCOPIC ULTRASOUND (EUS) LINEAR (N/A)  Patient location during evaluation: PACU Anesthesia Type: MAC Level of consciousness: awake and alert Pain management: pain level controlled Vital Signs Assessment: post-procedure vital signs reviewed and stable Respiratory status: spontaneous breathing, nonlabored ventilation, respiratory function stable and patient connected to nasal cannula oxygen Cardiovascular status: stable and blood pressure returned to baseline Anesthetic complications: no    Last Vitals:  Filed Vitals:   03/08/15 0745  BP: 156/52  Pulse: 66  Temp: 36.7 C  Resp: 13    Last Pain: There were no vitals filed for this visit.               Effie Berkshire

## 2015-03-08 NOTE — Anesthesia Preprocedure Evaluation (Addendum)
Anesthesia Evaluation  Patient identified by MRN, date of birth, ID band Patient awake    Reviewed: Allergy & Precautions, NPO status , Patient's Chart, lab work & pertinent test results, reviewed documented beta blocker date and time   Airway Mallampati: I  TM Distance: >3 FB Neck ROM: Full    Dental  (+) Teeth Intact   Pulmonary COPD, former smoker,    breath sounds clear to auscultation       Cardiovascular hypertension, Pt. on home beta blockers and Pt. on medications + CAD, + Past MI, + CABG and +CHF   Rhythm:Regular Rate:Normal     Neuro/Psych negative neurological ROS  negative psych ROS   GI/Hepatic Neg liver ROS, GERD  Medicated,  Endo/Other  negative endocrine ROS  Renal/GU negative Renal ROS  negative genitourinary   Musculoskeletal  (+) Arthritis , Osteoarthritis,    Abdominal   Peds  Hematology negative hematology ROS (+)   Anesthesia Other Findings   Reproductive/Obstetrics negative OB ROS                            Lab Results  Component Value Date   WBC 7.8 02/22/2015   HGB 13.6 02/22/2015   HCT 41.8 02/22/2015   MCV 88.0 02/22/2015   PLT 283 02/22/2015   Lab Results  Component Value Date   CREATININE 0.52 01/31/2015   BUN 13 01/31/2015   NA 141 01/31/2015   K 4.4 01/31/2015   CL 103 01/31/2015   CO2 30 01/31/2015   Lab Results  Component Value Date   INR 1.08 02/22/2015   12/2014 EKG: normal sinus rhythm.  01/2014 Echo Reviewed in Epic. EF 55%  Anesthesia Physical Anesthesia Plan  ASA: III  Anesthesia Plan: MAC   Post-op Pain Management:    Induction: Intravenous  Airway Management Planned: Natural Airway  Additional Equipment:   Intra-op Plan:   Post-operative Plan:   Informed Consent: I have reviewed the patients History and Physical, chart, labs and discussed the procedure including the risks, benefits and alternatives for the proposed  anesthesia with the patient or authorized representative who has indicated his/her understanding and acceptance.   Dental advisory given  Plan Discussed with: CRNA  Anesthesia Plan Comments:         Anesthesia Quick Evaluation

## 2015-03-08 NOTE — H&P (View-Only) (Signed)
Cardiology Office Note   Date:  02/12/2015   ID:  Tammy Schmitt, DOB 1940-04-08, MRN [id number]  PCP:  Shirline Frees, MD  Cardiologist:   Thayer Headings, MD   Chief Complaint  Patient presents with  . Coronary Artery Disease   Problem list: 1. Coronary artery disease-status post coronary artery bypass grafting status post RIMA to the right coronary artery and saphenous vein graft to the posterior lateral circumflex proximal artery in 2002 2. Hyperlipidemia:  3. Mild carotid artery disease:  4. Mild chronic systolic congestive heart failure 5. Hx of breast cancer ( left ) , s/p XRT and chemo and surgery /    History of Present Illness: Tammy Schmitt is a 75 y.o. female who presents to establish care for her CAD.  Moved from [place] this past spring  Has had some indigestion / heartburn.  No angina .  Relieved with rolaids. BP has been higher recently  Diet is about the same. Has joined the  [facility] but has not been exercising. Thinks she can do better with her nutrition .  Not able to walk far  -    Dec. 19. 2016: Doing well Seeing Dr. Henrene Pastor for some abdominal issues  No CP or dyspnea.   Past Medical History  Diagnosis Date  . Cataract   . CHF (congestive heart failure) ([place])   . GERD (gastroesophageal reflux disease)   . Myocardial infarction ([place]) 2001  . CAD (coronary artery disease)   . HLD (hyperlipidemia)   . Diverticulitis   . Colon polyp   . Hypertension   . Hemorrhoids   . Obesity   . Cancer [facility])     breast    Past Surgical History  Procedure Laterality Date  . Breast surgery Left     lymph nodes removed also  . Eye surgery Bilateral     cataracts removed  . Ovary surgery      one  tube removed , one ovary trimmed down  . Heart bypass      x 2, stent     Current Outpatient Prescriptions  Medication Sig Dispense Refill  . acetaminophen (TYLENOL) 500 MG tablet Take 500 mg by mouth at bedtime.    Marland Kitchen aspirin EC 81 MG tablet  Take 1 tablet (81 mg total) by mouth daily.    Marland Kitchen esomeprazole (NEXIUM) 40 MG capsule TAKE 1 CAPSULE (40 MG) BY MOUTH DAILY (Patient taking differently: TAKE 1 CAPSULE (40 MG) BY MOUTH DAILY at bedtime) 30 capsule 0  . LORazepam (ATIVAN) 0.5 MG tablet Take 0.5 mg by mouth every 8 (eight) hours. TAKES 1/2 TABLET BY MOUTH DAILY AT 9:20AM.    . losartan (COZAAR) 50 MG tablet Take 1 tablet (50 mg total) by mouth daily. 31 tablet 11  . metoprolol tartrate (LOPRESSOR) 25 MG tablet Take 0.5 tablets (12.5 mg total) by mouth 2 (two) times daily. 90 tablet 3  . nitroGLYCERIN (NITROSTAT) 0.4 MG SL tablet Place 0.4 mg under the tongue every 5 (five) minutes as needed for chest pain.    Marland Kitchen omeprazole (PRILOSEC) 40 MG capsule Take 1 capsule (40 mg total) by mouth 2 (two) times daily. 60 capsule 11   No current facility-administered medications for this visit.    Allergies:   Review of patient's allergies indicates no known allergies.    Social History:  The patient  reports that she quit smoking about 15 years ago. Her smoking use included Cigarettes. She has never used smokeless tobacco.  She reports that she drinks about 1.8 oz of alcohol per week. She reports that she does not use illicit drugs.   Family History:  The patient's family history includes Breast cancer (age of onset: 7) in her mother; Esophageal cancer (age of onset: 40) in her daughter; Heart disease in her brother and father; Leukemia (age of onset: 70) in her son. There is no history of Colon cancer or Stomach cancer.    ROS:  Please see the history of present illness.    Review of Systems: Constitutional:  denies fever, chills, diaphoresis, appetite change and fatigue.  HEENT: denies photophobia, eye pain, redness, hearing loss, ear pain, congestion, sore throat, rhinorrhea, sneezing, neck pain, neck stiffness and tinnitus.  Respiratory: admits to  DOE,    Cardiovascular: denies chest pain, palpitations and leg swelling.    Gastrointestinal: denies nausea, vomiting, abdominal pain, diarrhea, constipation, blood in stool.  Genitourinary: denies dysuria, urgency, frequency, hematuria, flank pain and difficulty urinating.  Musculoskeletal: denies  myalgias, back pain, joint swelling, arthralgias and gait problem.   Skin: denies pallor, rash and wound.  Neurological: denies dizziness, seizures, syncope, weakness, light-headedness, numbness and headaches.   Hematological: denies adenopathy, easy bruising, personal or family bleeding history.  Psychiatric/ Behavioral: denies suicidal ideation, mood changes, confusion, nervousness, sleep disturbance and agitation.       All other systems are reviewed and negative.    PHYSICAL EXAM: VS:  BP 131/76 mmHg  Pulse 66  Ht 4\' 10"  (1.473 m)  Wt 152 lb 3.2 oz (69.037 kg)  BMI 31.82 kg/m2 , BMI Body mass index is 31.82 kg/(m^2). GEN: Well nourished, well developed, in no acute distress HEENT: normal Neck: no JVD, carotid bruits, or masses Cardiac: RRR; no murmurs, rubs, or gallops,no edema  Respiratory:  clear to auscultation bilaterally, normal work of breathing GI: soft, nontender, nondistended, + BS MS: no deformity or atrophy Skin: warm and dry, no rash Neuro:  Strength and sensation are intact Psych: normal   EKG:  EKG is ordered today. The ekg ordered today demonstrates  NSR at 60, NS ST abn.     Recent Labs: 11/15/2014: Hemoglobin 14.3 01/31/2015: ALT 8; BUN 13; Creatinine, Ser 0.52; Potassium 4.4; Sodium 141    Lipid Panel    Component Value Date/Time   CHOL 180 01/31/2015 1551   TRIG 147.0 01/31/2015 1551   HDL 41.00 01/31/2015 1551   CHOLHDL 4 01/31/2015 1551   VLDL 29.4 01/31/2015 1551   LDLCALC 109* 01/31/2015 1551      Wt Readings from Last 3 Encounters:  02/12/15 152 lb 3.2 oz (69.037 kg)  01/31/15 154 lb (69.854 kg)  01/24/15 154 lb (69.854 kg)      Other studies Reviewed: Additional studies/ records that were reviewed today  include: . Review of the above records demonstrates:    ASSESSMENT AND PLAN:  1.  CAD - she status post coronary artery bypass grafting. She's not having any episodes of angina. We'll check fasting lipids today. We'll send her to a nutritionist.  encouraged her to exercise.  2. Essential Hypertension:  Will need to better on her diet and exercise program. We will start losartan 50 mg a day. We'll check a basic medical profile in 3 weeks.  3. Hyperlipidemia :  Intolerant to statins. Does not tolerate statins very well.   Has tried Atorvastatin - but developed muscle aches.   Has tried other statins but had a similar effect  Tolerated Zetia but cannot afford it  She  will work on her diet, exercise, and weight loss program. I'll see her again in 6 months. We'll check labs at that time.  Current medicines are reviewed at length with the patient today.  The patient has concerns regarding medicines.  The following changes have been made:  no change  Labs/ tests ordered today include:  No orders of the defined types were placed in this encounter.     Disposition:   FU with me in 6 months      Stevie Ertle, Wonda Cheng, MD  02/12/2015 11:12 AM    [facility] [place], [place], [place]  [address] Phone: [phone]; Fax: [phone]   [facility]  [address] [place], [place]  [address] [phone]   Fax [phone]

## 2015-03-08 NOTE — Op Note (Signed)
[facility] [place], [address]   ENDOSCOPIC ULTRASOUND PROCEDURE REPORT  PATIENT: Tammy Schmitt, Tammy Schmitt  MR#: [id number] BIRTHDATE: 1940-06-18  GENDER: female ENDOSCOPIST: Milus Banister, MD REFERRED BY:  Eustace Quail, M.D. PROCEDURE DATE:  03/08/2015 PROCEDURE:   Upper EUS w/FNA ASA CLASS:      Class II INDICATIONS:   1.  abnormal mass involving or directly abutting uncinate pancreas, multiple nodules throughout abdomen, abnormal ovary. MEDICATIONS: Monitored anesthesia care  DESCRIPTION OF PROCEDURE:   After the risks benefits and alternatives of the procedure were  explained, informed consent was obtained. The patient was then placed in the left, lateral, decubitus postion and IV sedation was administered. Throughout the procedure, the patients blood pressure, pulse and oxygen saturations were monitored continuously.  Under direct visualization, the Pentax Radial EUS [id number]  endoscope was introduced through the mouth  and advanced to the second portion of the duodenum .  Water was used as necessary to provide an acoustic interface.  Upon completion of the imaging, water was removed and the patient was sent to the recovery room in satisfactory condition.   Endoscopic findings: 1. UGI tract essentially normal  EUS findings: 1. Irregularly bordered retroperitoneal mass involving the uncinate pancreas. It is not clear if the mass originates from the pancreas or simply involves it. The mass is 3.5cm across, contains several small blood vessels. The mass was sampled with 3 transgastric passes with a 22 gauge EUS FNA Acquire needle. 2. The pancreatic parenchmya was otherwise normal. 3. Main pancreatic duct was normal, non-dilated 4. CBD was normal; non-dilated. 5. Gallbladder is normal appearing. 6. Limited views of liver, spleen, portal and splenic vessels were all normal.  ENDOSCOPIC IMPRESSION: 3.5cm retroperitoneal mass that  involves the uncinate pancreas. It is not clear by this exam  (or CT) if the mass originates in pancreas however.  Preliminary cytology review is + for malignancy, favoring adenocarcinoma.  She has an enlarged ovary on recent pelvic CT.  Consideration will need to be made about primary source here.  PET scan may be helpful.   I will communicate this with Dr. Henrene Pastor.  _______________________________ eSignedMilus Banister, MD 03/08/2015 10:00 AM

## 2015-03-08 NOTE — Interval H&P Note (Signed)
History and Physical Interval Note:  03/08/2015 8:19 AM  Tammy Schmitt  has presented today for surgery, with the diagnosis of panc mass  The various methods of treatment have been discussed with the patient and family. After consideration of risks, benefits and other options for treatment, the patient has consented to  Procedure(s) with comments: UPPER ENDOSCOPIC ULTRASOUND (EUS) LINEAR (N/A) - radial linear as a surgical intervention .  The patient's history has been reviewed, patient examined, no change in status, stable for surgery.  I have reviewed the patient's chart and labs.  Questions were answered to the patient's satisfaction.     Milus Banister

## 2015-03-09 ENCOUNTER — Encounter (HOSPITAL_COMMUNITY): Payer: Self-pay | Admitting: Gastroenterology

## 2015-03-12 ENCOUNTER — Telehealth: Payer: Self-pay | Admitting: Internal Medicine

## 2015-03-12 NOTE — Telephone Encounter (Signed)
Pt wants to know what the next step is regarding her care. Please advise.

## 2015-03-13 ENCOUNTER — Telehealth: Payer: Self-pay | Admitting: Gastroenterology

## 2015-03-13 NOTE — Telephone Encounter (Signed)
Dr Orene Desanctis is reviewing the case, a message will be sent to Dr Orene Desanctis to inquire on status.

## 2015-03-13 NOTE — Telephone Encounter (Signed)
Can you call pathology?, still haven't heard about last week EUS cytology

## 2015-03-13 NOTE — Telephone Encounter (Signed)
Awaiting on pathology from Dr. Ardis Hughs. If positive, will need oncology referral

## 2015-03-13 NOTE — Telephone Encounter (Signed)
Pt aware.

## 2015-03-14 NOTE — Telephone Encounter (Signed)
Pt has been notified see alternate note

## 2015-03-16 ENCOUNTER — Telehealth: Payer: Self-pay | Admitting: Internal Medicine

## 2015-03-16 ENCOUNTER — Other Ambulatory Visit: Payer: Self-pay

## 2015-03-16 DIAGNOSIS — R195 Other fecal abnormalities: Secondary | ICD-10-CM

## 2015-03-16 NOTE — Telephone Encounter (Signed)
Patient agrees to this plan. Understands she must submit unformed stool.

## 2015-03-16 NOTE — Telephone Encounter (Signed)
Spoke with the patient. She is having very loose bowel movements after she eats. This is not a new symptom. It has been occuring every day since the "biopsy about 4 weeks ago." She is on a regular diet. There is intense nausea but no vomiting. Please advise.

## 2015-03-16 NOTE — Telephone Encounter (Signed)
Check stool for Clostridium difficile. If negative, she can use Imodium for loose stools

## 2015-03-19 ENCOUNTER — Ambulatory Visit: Payer: Medicare HMO | Attending: Gynecologic Oncology | Admitting: Gynecologic Oncology

## 2015-03-19 ENCOUNTER — Other Ambulatory Visit (HOSPITAL_BASED_OUTPATIENT_CLINIC_OR_DEPARTMENT_OTHER): Payer: Medicare HMO

## 2015-03-19 ENCOUNTER — Encounter: Payer: Self-pay | Admitting: Gynecologic Oncology

## 2015-03-19 DIAGNOSIS — N839 Noninflammatory disorder of ovary, fallopian tube and broad ligament, unspecified: Secondary | ICD-10-CM

## 2015-03-19 DIAGNOSIS — K8689 Other specified diseases of pancreas: Secondary | ICD-10-CM

## 2015-03-19 DIAGNOSIS — K869 Disease of pancreas, unspecified: Secondary | ICD-10-CM

## 2015-03-19 DIAGNOSIS — N838 Other noninflammatory disorders of ovary, fallopian tube and broad ligament: Secondary | ICD-10-CM

## 2015-03-19 NOTE — Patient Instructions (Signed)
Plan to have tumor markers drawn today.  You will receive a phone call from the [facility] with an appointment to meet with Dr. Benay Spice or Dr. Irene Limbo (Medical Oncologists).  You will also receive a phone call from Radiology to have you come in for a biopsy.  Please call for any questions or concerns.

## 2015-03-19 NOTE — Progress Notes (Signed)
Consult Note: Gyn-Onc  Consult was requested by Dr. Linda Hedges for the evaluation of KALSEY SMUCKER 75 y.o. female with a pancreatic mass   CC:  Chief Complaint  Patient presents with  . Pancreatic Mass, Ovarian mass    New Consultation    Assessment/Plan:  Ms. SLOKA HUDSON  is a 75 y.o.  year old what is fairly certainly clinical stage IV pancreatic cancer.  Attempts at histologic confirmation have been unsuccessful to date (endoscopic biopsy in January 2017 and failed attempt at CT guided omental biopsy in December 2016).  I personally reviewed the images from her CT abdomen on 02/09/15 and from the CT guided biopsy on 02/22/2015. The patient has a large mass infiltrative into/from the pancreatic tail/uncinate process and duodenum which is encasing the surrounding vessels and is associated with small volume asites, peritoneal studding of tumor implants and omental implants. On the CT guided biopsy there is a film which demonstrates a 4cm smooth, spherical solid mass in the left adnexa which likely represents a metastasis from her pancreatic tumor. It does not appear consistent in appearance with a primary ovarian malignancy.  However, this ovarian mass may represent a good target for sampling to establish definitive diagnosis and facilitate initiating therapy. I discussed with Ms Crumrine that I am not a specialist in pancreatic cancer, however, we will facilitate her seeing a specialist, Dr Benay Spice, as soon as possible. In the meantime, we have ordered a biopsy of the ovarian mass and tumor marker assessment. I discussed that while I did not know the definitive plan for her treatment, given the apparent widely metastatic nature of her disease, chemotherapy will likely be the treatment of choice.  HPI: Jelisha Retallick is a very pleasant 75 year old woman who is seen in consultation at the request of Dr. Linda Hedges for an ovarian mass and pancreatic mass.  The patient reports  having epigastric and abdominal pain and difficulty eating since October 2016. She was seen by a gastroenterologist and CT imaging of the abdomen was performed on 02/09/2015. It revealed several sub-centimeter low-attenuation lesions in the liver which are favored to be tiny cysts, immediately adjacent to or emanating from the left side of the uncinate process of the pancreas there is an infiltrating appearing mass measuring 4 x 3.5 x 4.2 cm which is intimately associated with the uncinate process of the pancreas and a third and fourth portions of the duodenum. This mass completing cases the superior mesenteric artery and comes indirect contact with the superior mesenteric vein. The mass is separate from the splenic vein and comes in close proximity but appears to be separate from the left renal vein the posterior aspect of the mass is in direct contact with the infrarenal aorta. There also borderline enlarged mesenteric lymph nodes measuring up to 9 mm. There were multiple tiny areas of soft tissue in nodularity associated with the omentum highly concerning for intraperitoneal metastases. There is trace volume ascites.   On 02/22/2015 she underwent an attempt at CT-guided biopsy of the omental masses. This is unsuccessful due to the fact that they mobilized away from the needle at all attempts. During that imaging a 4 cm mass was identified on the left ovary.  The patient subsequently underwent attempted endoscopic guided biopsy of the pancreatic mass on 08/06/2015. Besides her pathology from this revealed a few atypical cells with background of acute chronic inflammation and necrosis.  The patient otherwise has a medical history significant for hormone receptor positive breast cancer approximately  18 years ago in [place]. This was treated with back to me, chemotherapy and radiation.  She also has a history of myocardial infarction and coronary artery disease and status post coronary artery bypass graft 2  vessels. She has good performance status and good METS.  Her family history is significant for a mother having breast cancer, a daughter having esophageal cancer at age 26 and her son dying of leukemia at age 51.  Current Meds:  Outpatient Encounter Prescriptions as of 03/19/2015  Medication Sig  . acetaminophen (TYLENOL) 500 MG tablet Take 1,000 mg by mouth 2 (two) times daily as needed for headache.   Marland Kitchen aspirin EC 81 MG tablet Take 1 tablet (81 mg total) by mouth daily.  . diphenhydramine-acetaminophen (TYLENOL PM) 25-500 MG TABS tablet Take 1 tablet by mouth at bedtime.  Marland Kitchen LORazepam (ATIVAN) 0.5 MG tablet Take 0.25 mg by mouth daily as needed for anxiety.   Marland Kitchen losartan (COZAAR) 50 MG tablet Take 1 tablet (50 mg total) by mouth daily.  . metoprolol tartrate (LOPRESSOR) 25 MG tablet Take 0.5 tablets (12.5 mg total) by mouth 2 (two) times daily.  Marland Kitchen omeprazole (PRILOSEC) 40 MG capsule Take 1 capsule (40 mg total) by mouth 2 (two) times daily.  Vladimir Faster Glycol-Propyl Glycol (SYSTANE OP) Apply 1 drop to eye daily as needed (for dry eyes).  . Polyvinyl Alcohol-Povidone (REFRESH OP) Apply 1 drop to eye daily as needed (for dry eyes).  . budesonide-formoterol (SYMBICORT) 160-4.5 MCG/ACT inhaler Inhale 2 puffs into the lungs 2 (two) times daily as needed (for bronchitis). Reported on 03/19/2015  . ibuprofen (ADVIL,MOTRIN) 200 MG tablet Take 400 mg by mouth 2 (two) times daily as needed for moderate pain.  . nitroGLYCERIN (NITROSTAT) 0.4 MG SL tablet Place 0.4 mg under the tongue every 5 (five) minutes as needed for chest pain. Reported on 03/19/2015   No facility-administered encounter medications on file as of 03/19/2015.    Allergy:  Allergies  Allergen Reactions  . Statins Other (See Comments)    Muscle pain    . Ace Inhibitors     From Previous Records  . Codeine   . Levaquin [Levofloxacin In D5w]     From previous records    Social Hx:   Social History   Social History  . Marital  Status: Divorced    Spouse Name: N/A  . Number of Children: 3  . Years of Education: N/A   Occupational History  . retired    Social History Main Topics  . Smoking status: Former Smoker    Types: Cigarettes    Quit date: 12/26/1999  . Smokeless tobacco: Never Used  . Alcohol Use: No  . Drug Use: No  . Sexual Activity: Not on file   Other Topics Concern  . Not on file   Social History Narrative   Retired from working with disabled individuals    Two sons and one daughter   She likes to Psychologist, occupational and exercise.     Past Surgical Hx:  Past Surgical History  Procedure Laterality Date  . Breast surgery Left     lymph nodes removed also  . Eye surgery Bilateral     cataracts removed  . Ovary surgery      one  tube removed , one ovary trimmed down  . Heart bypass      x 2, stent  . Coronary artery bypass graft  2001    x 2  . Cardiac catheterization  2001  .  Eus N/A 03/08/2015    Procedure: UPPER ENDOSCOPIC ULTRASOUND (EUS) LINEAR;  Surgeon: Milus Banister, MD;  Location: [facility];  Service: Endoscopy;  Laterality: N/A;  radial linear    Past Medical Hx:  Past Medical History  Diagnosis Date  . Cataract   . CHF (congestive heart failure) ([place])   . GERD (gastroesophageal reflux disease)   . Myocardial infarction ([place]) 2001  . CAD (coronary artery disease)   . HLD (hyperlipidemia)   . Diverticulitis   . Colon polyp   . Hypertension   . Hemorrhoids   . Obesity   . Cancer ([place]) 2000    breast  . DDD (degenerative disc disease), lumbar   . Prolapsed uterus   . Family history of adverse reaction to anesthesia     maternal aunt came out of surgery not knowing what happened and where she was  . COPD (chronic obstructive pulmonary disease) ([place])   . Hyperlipidemia   . Mitral regurgitation   . Colon polyp   . Breast cancer ([place]) 2001    Past Gynecological History:  Prolapse. Postmenopausal bleeding in 2016 worked up in [place] with endometrial biopsy and [place]  (benign pathology). She has a ring pessary for pelvic organ prolapse.  No LMP recorded. Patient is postmenopausal.  Family Hx:  Family History  Problem Relation Age of Onset  . Breast cancer Mother 32  . Heart disease Father   . Heart disease Brother   . Esophageal cancer Daughter 65    died at 6  . Leukemia Son 53  . Colon cancer Neg Hx   . Stomach cancer Neg Hx     Review of Systems:  Constitutional  Feels well,    ENT Normal appearing ears and nares bilaterally Skin/Breast  No rash, sores, jaundice, itching, dryness Cardiovascular  No chest pain, shortness of breath, or edema  Pulmonary  No cough or wheeze.  Gastro Intestinal  + epigastric pain Genito Urinary  No frequency, urgency, dysuria, no bleeding Musculo Skeletal  No myalgia, arthralgia, joint swelling or pain  Neurologic  No weakness, numbness, change in gait,  Psychology  No depression, anxiety, insomnia.   Vitals:  Blood pressure 124/82, pulse 82, temperature 97.8 F (36.6 C), temperature source Oral, resp. rate 18, height 4\' 10"  (1.473 m), weight 145 lb (65.772 kg), SpO2 95 %.  Physical Exam: WD in NAD Neck  Supple NROM, without any enlargements.  Lymph Node Survey No cervical supraclavicular or inguinal adenopathy Cardiovascular  Pulse normal rate, regularity and rhythm. S1 and S2 normal.  Lungs  Clear to auscultation bilateraly, without wheezes/crackles/rhonchi. Good air movement.  Skin  No rash/lesions/breakdown  Psychiatry  Alert and oriented to person, place, and time  Abdomen  Normoactive bowel sounds, abdomen soft, non-tender and thin without evidence of hernia. No masses  Back No CVA tenderness Genito Urinary  Vulva/vagina: Normal external female genitalia.  No lesions. No discharge or bleeding.  Bladder/urethra:  No lesions or masses, + prolapse  Vagina: + prolapse  Cervix: Normal appearing, no lesions.  Uterus:  Small, mobile, no parametrial involvement or nodularity.  Adnexa:  no palpable masses. Rectal  Good tone, no masses no cul de sac nodularity.  Extremities  No bilateral cyanosis, clubbing or edema.   Donaciano Eva, MD  03/19/2015, 10:15 AM

## 2015-03-20 ENCOUNTER — Telehealth: Payer: Self-pay | Admitting: *Deleted

## 2015-03-20 ENCOUNTER — Telehealth: Payer: Self-pay | Admitting: Gynecologic Oncology

## 2015-03-20 LAB — CANCER ANTIGEN 19-9: CAN 19-9: 953 U/mL — AB (ref 0–35)

## 2015-03-20 LAB — CEA: CEA: 2.1 ng/mL (ref 0.0–4.7)

## 2015-03-20 LAB — CA 125: Cancer Antigen (CA) 125: 481.7 U/mL — ABNORMAL HIGH (ref 0.0–38.1)

## 2015-03-20 NOTE — Telephone Encounter (Signed)
Oncology Nurse Navigator Documentation  Oncology Nurse Navigator Flowsheets 03/20/2015  Navigator Location [facility]  Navigator Encounter Type Introductory phone call  Spoke with patient and provided new patient appointment for 03/22/15 at 3:00 with Dr. Irene Limbo. Informed of location of [place], valet service, and registration process. Reminded to bring insurance cards and a current medication list, including supplements. Patient verbalizes understanding.

## 2015-03-20 NOTE — Telephone Encounter (Signed)
Patient informed of tumor marker results.  Advised she should be receiving a phone call from the [facility] to come in and meet with Dr. Benay Spice or Dr. Irene Limbo.  No concerns voiced.  Advised to call for any needs or concerns.

## 2015-03-22 ENCOUNTER — Encounter: Payer: Self-pay | Admitting: *Deleted

## 2015-03-22 ENCOUNTER — Encounter: Payer: Self-pay | Admitting: Hematology

## 2015-03-22 ENCOUNTER — Ambulatory Visit (HOSPITAL_BASED_OUTPATIENT_CLINIC_OR_DEPARTMENT_OTHER): Payer: Medicare HMO | Admitting: Hematology

## 2015-03-22 ENCOUNTER — Telehealth: Payer: Self-pay | Admitting: Hematology

## 2015-03-22 VITALS — BP 121/57 | HR 70 | Temp 97.5°F | Resp 17 | Ht <= 58 in | Wt 145.6 lb

## 2015-03-22 DIAGNOSIS — N838 Other noninflammatory disorders of ovary, fallopian tube and broad ligament: Secondary | ICD-10-CM

## 2015-03-22 DIAGNOSIS — K869 Disease of pancreas, unspecified: Secondary | ICD-10-CM | POA: Diagnosis not present

## 2015-03-22 DIAGNOSIS — N839 Noninflammatory disorder of ovary, fallopian tube and broad ligament, unspecified: Secondary | ICD-10-CM

## 2015-03-22 DIAGNOSIS — K8681 Exocrine pancreatic insufficiency: Secondary | ICD-10-CM | POA: Insufficient documentation

## 2015-03-22 DIAGNOSIS — K8689 Other specified diseases of pancreas: Secondary | ICD-10-CM

## 2015-03-22 MED ORDER — PANCRELIPASE (LIP-PROT-AMYL) 24000-76000 UNITS PO CPEP: 1.0000 | ORAL_CAPSULE | Freq: Three times a day (TID) | ORAL | Status: AC

## 2015-03-22 NOTE — Progress Notes (Signed)
Oncology Nurse Navigator Documentation  Oncology Nurse Navigator Flowsheets 03/22/2015  Navigator Location [facility]  Navigator Encounter Type Initial MedOnc  Abnormal Finding Date 02/09/2015  Patient Visit Type MedOnc;Initial  Treatment Phase Abnormal Scans  Barriers/Navigation Needs Family concerns;Education;Coordination of Care  Education Newly Diagnosed Cancer Education;Preparing for Upcoming Biopsy/ Treatment;Understanding Cancer/ Treatment Options; Symptom Management  Interventions Coordination of Care;Medication assitance-provided [place] copay card  Coordination of Care Radiology--scheduled PET scan at [place]  Support Groups/Services GI Support Group  Acuity Level 2  Time Spent with Patient 79  Met with patient and sister-in-law during new patient visit. Explained the role of the GI Nurse Navigator and provided New Patient Packet with information on: 1. Pancreatic cancer--handouts on tumor markers CA 19-9 & CA 125 2. Support groups 3. Advanced Directives 4. Fall Safety Plan Answered questions, reviewed current treatment plan using TEACH back and provided emotional support. Provided copy of current treatment plan.  Called managed care ( Brandi Harvell at [facility]) to alert to need to expedite her PA for PET scan on 03/29/15.  Merceda Elks, RN, BSN GI Oncology [place]

## 2015-03-22 NOTE — Progress Notes (Signed)
Marland Kitchen    HEMATOLOGY/ONCOLOGY CONSULTATION NOTE  Date of Service: 03/22/2015  Patient Care Team: [name], NP as PCP - General (Family Medicine)  CHIEF COMPLAINTS/PURPOSE OF CONSULTATION:  Pancreatic and Ovarian mass  HISTORY OF PRESENTING ILLNESS:  Tammy Schmitt is a wonderful 75 y.o. female who has been referred to [place] by Dr .[name], NP for evaluation and management of pancreatic and ovarian mass.  Patient presented with epigastric and upper abdominal pain and back pain worsening with eating that was noticeably since oct 2016. She subsequently was evaluated by GI and had a CT abd on 02/09/2016 which showed a pancreatic mass arising from the left side of the uncinate process of the pancreas measuring 4 x 3.5 x 4.2 cm. 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 There is a suspicious 6 mm lesion in segment 7 of the liver, potentially metastatic. In addition, there appears to be widespread intraperitoneal metastasis and a trace volume of ascites which may be malignant ascites.  On 02/22/2016 an attempt was made to get a CT guided biopsy of the omental masses but this was unsuccessful due to the mobility of these masses.  Subsequently the patient underwent an attempted EUS directed biopsy of the pancreatic mass on /01/2016 which showed a few atypical cells with a background of acute and chronic inflammation and necrosis.  The patient subsequently was seen by Dr Denman George in [place] after she was noted to have a left ovarian mass. She was setup to get a biopsy of the left ovarian mass to establish a  tissue diagnosis of the tumor.  She noted that she was diagnosed with Hormone receptor positive breast cancer at [facility] in [place] about 36yrs ago. She notes that she was treated with a left sided lumpectomy, SLNB, adjuvant chemotherapy and RT and tamoxifen which she took only for 1 yr.  FHx - mother had breast cancer, daughter esophageal cancer at age 30 yrs abd son with ALL at 26yrs of age.  MEDICAL HISTORY:  Past Medical History  Diagnosis Date  . Cataract   . CHF (congestive heart failure) ([place])   . GERD (gastroesophageal reflux disease)   . Myocardial infarction ([place]) 2001  . CAD (coronary artery disease)   . HLD (hyperlipidemia)   . Diverticulitis   . Colon polyp   . Hypertension   . Hemorrhoids   . Obesity   . Cancer ([place]) 2000    breast  . DDD (degenerative disc disease), lumbar   . Prolapsed uterus   . Family history of adverse reaction to anesthesia     maternal aunt came out of surgery not knowing what happened and where she was  . COPD (chronic obstructive pulmonary disease) ([place])   . Hyperlipidemia   . Mitral regurgitation   . Colon polyp   . Breast cancer ([place]) 2001    SURGICAL HISTORY: Past Surgical History  Procedure Laterality Date  . Breast surgery Left     lymph nodes removed also  . Eye surgery Bilateral     cataracts removed  . Ovary surgery      one  tube removed , one ovary trimmed down  . Heart  bypass      x 2, stent  . Coronary artery bypass graft  2001    x 2  . Cardiac catheterization  2001  . Eus N/A 03/08/2015    Procedure: UPPER ENDOSCOPIC ULTRASOUND (EUS) LINEAR;  Surgeon: Milus Banister, MD;  Location: [facility];  Service: Endoscopy;  Laterality: N/A;  radial linear    SOCIAL HISTORY: Social History   Social History  . Marital Status: Divorced    Spouse Name: N/A  . Number of Children: 3  . Years of Education: N/A   Occupational History  . retired    Social History Main Topics  . Smoking status:  Former Smoker    Types: Cigarettes    Quit date: 12/26/1999  . Smokeless tobacco: Never Used  . Alcohol Use: No  . Drug Use: No  . Sexual Activity: Not on file   Other Topics Concern  . Not on file   Social History Narrative   Retired from working with disabled individuals    Two sons and one daughter (daughter and one son have [place]   Divorced, lives alone; moved to [place] from [place] May 2016   She likes to Psychologist, occupational and exercise.     FAMILY HISTORY: Family History  Problem Relation Age of Onset  . Breast cancer Mother 57  . Heart disease Father   . Heart disease Brother   . Esophageal cancer Daughter 63    died at 1  . Leukemia Son 96  . Colon cancer Neg Hx   . Stomach cancer Neg Hx     ALLERGIES:  is allergic to statins; ace inhibitors; codeine; and levaquin.  MEDICATIONS:  Current Outpatient Prescriptions  Medication Sig Dispense Refill  . acetaminophen (TYLENOL) 500 MG tablet Take 1,000 mg by mouth 2 (two) times daily as needed for headache.     Marland Kitchen aspirin EC 81 MG tablet Take 1 tablet (81 mg total) by mouth daily.    . budesonide-formoterol (SYMBICORT) 160-4.5 MCG/ACT inhaler Inhale 2 puffs into the lungs 2 (two) times daily as needed (for bronchitis). Reported on 03/19/2015    . diphenhydramine-acetaminophen (TYLENOL PM) 25-500 MG TABS tablet Take 1 tablet by mouth at bedtime.    Marland Kitchen HYDROmorphone (DILAUDID) 2 MG tablet Take 0.5 tablets (1 mg total) by mouth every 4 (four) hours as needed for severe pain. 30 tablet 0  . ibuprofen (ADVIL,MOTRIN) 200 MG tablet Take 400 mg by mouth 2 (two) times daily as needed for moderate pain.    Marland Kitchen LORazepam (ATIVAN) 0.5 MG tablet Take 0.25 mg by mouth daily as needed for anxiety.     Marland Kitchen losartan (COZAAR) 50 MG tablet Take 1 tablet (50 mg total) by mouth daily. 31 tablet 11  . metoprolol tartrate (LOPRESSOR) 25 MG tablet Take 0.5 tablets (12.5 mg total) by mouth 2 (two) times daily. 90 tablet 3  . nitroGLYCERIN (NITROSTAT) 0.4 MG SL tablet  Place 0.4 mg under the tongue every 5 (five) minutes as needed for chest pain. Reported on 03/19/2015    . omeprazole (PRILOSEC) 40 MG capsule Take 1 capsule (40 mg total) by mouth 2 (two) times daily. 60 capsule 11  . Pancrelipase, Lip-Prot-Amyl, 24000 units CPEP Take 1 capsule (24,000 Units total) by mouth 3 (three) times daily with meals. 180 capsule 1  . Polyethyl Glycol-Propyl Glycol (SYSTANE OP) Apply 1 drop to eye daily as needed (for dry eyes).    . Polyvinyl Alcohol-Povidone (REFRESH OP) Apply 1 drop to eye daily as  needed (for dry eyes).     No current facility-administered medications for this visit.    REVIEW OF SYSTEMS:    10 Point review of Systems was done is negative except as noted above.  PHYSICAL EXAMINATION: ECOG PERFORMANCE STATUS: 1 - Symptomatic but completely ambulatory  . Filed Vitals:   03/22/15 1455  BP: 121/57  Pulse: 70  Temp: 97.5 F (36.4 C)  Resp: 17   Filed Weights   03/22/15 1455  Weight: 145 lb 9.6 oz (66.044 kg)   .Body mass index is 30.44 kg/(m^2).  GENERAL:alert, mild distress due to pain and uncertainty of diagnosis. SKIN: skin color, texture, turgor are normal, no rashes or significant lesions EYES: normal, conjunctiva are pink and non-injected, sclera clear OROPHARYNX:no exudate, no erythema and lips, buccal mucosa, and tongue normal  NECK: supple, no JVD, thyroid normal size, non-tender, without nodularity LYMPH:  no palpable lymphadenopathy in the cervical, axillary or inguinal LUNGS: clear to auscultation with normal respiratory effort HEART: regular rate & rhythm,  no murmurs and no lower extremity edema ABDOMEN: TTP epigastric regions, normoactive BM no g/r/r Musculoskeletal: no cyanosis of digits and no clubbing  PSYCH: alert & oriented x 3 with fluent speech NEURO: no focal motor/sensory deficits  LABORATORY DATA:  I have reviewed the data as listed  . CBC Latest Ref Rng 02/22/2015 11/15/2014  WBC 4.0 - 10.5 K/uL 7.8 7.7    Hemoglobin 12.0 - 15.0 g/dL 13.6 14.3  Hematocrit 36.0 - 46.0 % 41.8 44.2  Platelets 150 - 400 K/uL 283 -    . CMP Latest Ref Rng 01/31/2015 01/31/2015 11/29/2014  Glucose 70 - 99 mg/dL 94 - 94  BUN 6 - 23 mg/dL 13 13 20   Creatinine 0.40 - 1.20 mg/dL 0.52 0.52 0.62  Sodium 135 - 145 mEq/L 141 - 136  Potassium 3.5 - 5.1 mEq/L 4.4 - 4.0  Chloride 96 - 112 mEq/L 103 - 106  CO2 19 - 32 mEq/L 30 - 25  Calcium 8.4 - 10.5 mg/dL 9.6 - 9.2  Total Protein 6.0 - 8.3 g/dL 7.0 - -  Total Bilirubin 0.2 - 1.2 mg/dL 0.4 - -  Alkaline Phos 39 - 117 U/L 89 - -  AST 0 - 37 U/L 16 - -  ALT 0 - 35 U/L 8 - -   Component     Latest Ref Rng 03/19/2015  CEA     0.0 - 4.7 ng/mL 2.1  Cancer Antigen (CA) 125     0.0 - 38.1 U/mL 481.7 (H)  CA 19-9     0 - 35 U/mL 953 (H)  \     RADIOGRAPHIC STUDIES: I have personally reviewed the radiological images as listed and agreed with the findings in the report. Nm Pet Image Initial (pi) Skull Base To Thigh  03/29/2015  CLINICAL DATA:  Initial treatment strategy for pancreatic mass. History of left breast cancer 15 years ago with chemotherapy and radiation therapy. Biopsy of left ovary last week. EXAM: NUCLEAR MEDICINE PET SKULL BASE TO THIGH TECHNIQUE: 12.3 mCi F-18 FDG was injected intravenously. Full-ring PET imaging was performed from the skull base to thigh after the radiotracer. CT data was obtained and used for attenuation correction and anatomic localization. FASTING BLOOD GLUCOSE:  Value: 98 mg/dl COMPARISON:  Abdominal CT of 02/09/2015. FINDINGS: NECK No areas of abnormal hypermetabolism. CHEST Left suprahilar hypermetabolism along the pulmonary artery is favored to be vascular and is without correlate adenopathy. This measures a S.U.V. max of 3.4 including  on image 75/ series 3. ABDOMEN/PELVIS Hypermetabolism corresponding to the soft tissue mass centered about the inferior most aspect of the uncinate process of the pancreas and adjacent transverse duodenum.  This is not well visualized on today's CT, but measures a S.U.V. max of 11.6, including on image 139/ series 3. Extensive omental/ peritoneal metastasis. This is progressive since the prior diagnostic CT. An example omental nodule measures 1.2 cm and a S.U.V. max of 5.1 on image 122/series 3. More inferior omental nodule is new or significantly enlarged since the prior exam, measures 1.3 cm and a S.U.V. max of 5.9 on image 131/series 3. Innumerable omental nodules are identified concurrent with hypermetabolism more inferiorly, including on image 162/ series 3. Small bowel mesenteric nodules or nodes which are hypermetabolic. Example at 8 mm and a S.U.V. max of 4.5 on image 157/ series 3. Left ovarian mass demonstrates relatively low-level hypermetabolism. This measures 4.2 x 3.5 cm and a S.U.V. max of 3.5 on image 197/series 3. SKELETON No abnormal marrow activity. CT IMAGES PERFORMED FOR ATTENUATION CORRECTION Bilateral carotid atherosclerosis.  No cervical adenopathy. Prior median sternotomy. Mild cardiomegaly. Moderate hiatal hernia. Trace right pleural fluid. Moderate centrilobular emphysema. Volume loss in inferior right upper lobe. Right lower lobe 7 mm nodule on image 82/series 3. Left lower lobe 6 mm nodule on image 79/series 3. Vague 4 mm left upper lobe pulmonary nodule. Development of moderate volume abdominal ascites. No bowel obstruction or other acute complication. Advanced abdominal aortic atherosclerosis. Pessary in place. IMPRESSION: 1. Hypermetabolism corresponding to ill-defined soft tissue mass arising either from the uncinate process of the pancreas or the transverse duodenum. This is consistent with primary malignancy. 2. Moderate to marked progression of widespread omental/peritoneal metastasis. Development of moderate volume ascites. Nodal metastasis within the small bowel mesentery. If successful tissue sampling has not been performed, laparoscopy may be necessary (presuming endoscopic  ultrasound of the presumed primary is not possible). 3. Low-level hypermetabolism in the left ovary is favored to represent a a synchronous indolent and likely benign neoplasm, given the biopsy results suggesting a fibroma. 4. Bilateral lung nodules. Although technically indeterminate, suspicious for pulmonary metastasis. 5. Trace right pleural fluid. 6. Hiatal hernia. Electronically Signed   By: Abigail Miyamoto M.D.   On: 03/29/2015 12:15   Ct Biopsy  03/26/2015  INDICATION: Left ovarian mass EXAM: CT BIOPSY MEDICATIONS: None. ANESTHESIA/SEDATION: Fentanyl 50 mcg IV; Versed 2.5 mg IV Moderate Sedation Time:  18 The patient was continuously monitored during the procedure by the interventional radiology nurse under my direct supervision. FLUOROSCOPY TIME:  None COMPLICATIONS: None immediate. PROCEDURE: Informed written consent was obtained from the patient after a thorough discussion of the procedural risks, benefits and alternatives. All questions were addressed. Maximal Sterile Barrier Technique was utilized including caps, mask, sterile gowns, sterile gloves, sterile drape, hand hygiene and skin antiseptic. A timeout was performed prior to the initiation of the procedure. Under CT guidance, a(n) 17 gauge guide needle was advanced into the left ovarian mass via left trans gluteal approach. Subsequently 2 18 gauge core biopsies were obtained. The guide needle was removed. Post biopsy images demonstrate no hemorrhage. Patient tolerated the procedure well without complication. Vital sign monitoring by nursing staff during the procedure will continue as patient is in the special procedures unit for post procedure observation. The images document guide needle placement within the left ovarian mass. Post biopsy images demonstrate no hemorrhage. IMPRESSION: Successful core biopsy of a left ovarian mass. Electronically Signed   By: Arnell Sieving  Hoss M.D.   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 EUS bx - atypical cells not definitive diagnosis of pancreatic adenocarcinoma -though this is the some likely diagnosis. CA 19-9 elevated. 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 2) Neoplasm related abdominal pain 3) Pancreatic exocrine deficiency  Plan -given dilaudid for pain mx with bowel prophylaxis -creon for pancreatic exocrine insufficiency -PET/CT and CT guided left ovarian mass biopsy as noted above. -since left ovarian bx not diagnostic of the pancreatic pathology could consider US guided paracentesis to check for malignant cells v/s laparoscopic bx of mesenteric LN/peritoneal nodules. -continue f/u with PCP for continue cares of other medical issues.  4) h/o left breast hormone positive cancer 18 yrs ago at [facility] in [place] about 89yrs ago. She notes that she was treated with a left sided lumpectomy, SLNB, adjuvant chemotherapy and RT and tamoxifen which she took only for 1 yr. -no current evidence of breast cancer recurrence.  All of the patients questions were answered to her apparent satisfaction. The patient knows to call the clinic with any problems, questions or concerns.  RTC in 2 weeks with PET/CT and left ovarian biopsy results.  I spent 55 minutes counseling the patient face to face. The total time spent in the appointment was 60 minutes and more than 50% was on counseling and direct patient cares.    Sullivan Lone MD [place] AAHIVMS [facility] Hematology/Oncology Physician [facility]  (Office):       [phone] (Work cell):  [phone] (Fax):           [phone]  03/22/2015 3:06 PM

## 2015-03-22 NOTE — Telephone Encounter (Signed)
Gv pt appt for 2/6 and advised c-sched will call to make Pet scan appt.

## 2015-03-23 ENCOUNTER — Telehealth: Payer: Self-pay | Admitting: *Deleted

## 2015-03-23 ENCOUNTER — Encounter: Payer: Self-pay | Admitting: *Deleted

## 2015-03-23 ENCOUNTER — Other Ambulatory Visit: Payer: Self-pay | Admitting: General Surgery

## 2015-03-23 MED ORDER — HYDROMORPHONE HCL 2 MG PO TABS: 1.0000 mg | ORAL_TABLET | ORAL | Status: DC | PRN

## 2015-03-23 NOTE — Telephone Encounter (Signed)
  Oncology Nurse Navigator Documentation  Navigator Location: [facility] (03/23/15 1234) Navigator Encounter Type: Telephone (03/23/15 1234) Telephone: Lahoma Crocker Call;Appt Confirmation/Clarification (03/23/15 1234)  Notified patient of PET 03/29/15 at 1000/1030 at [facility] in [place]. NPO 6 hours prior--managed care still working on PA (sent in 03/03/15). Will follow up status of this on 1/31.

## 2015-03-23 NOTE — Progress Notes (Signed)
  Oncology Nurse Navigator Documentation Faxed release for records to Dr. Tonny Branch (med onc in [place])  [phone] (ph [phone]) and [facility] [phone]) and her [place], Dr. Santiago Bur [phone] (ph # [phone]). Also made her aware of her PET appointment on 03/29/15 and prep.

## 2015-03-26 ENCOUNTER — Ambulatory Visit (HOSPITAL_COMMUNITY)
Admission: RE | Admit: 2015-03-26 | Discharge: 2015-03-26 | Disposition: A | Discharge status: Home / Self Care | Payer: Medicare HMO | Source: Ambulatory Visit | Attending: Gynecologic Oncology | Admitting: Gynecologic Oncology

## 2015-03-26 ENCOUNTER — Encounter (HOSPITAL_COMMUNITY): Payer: Self-pay

## 2015-03-26 DIAGNOSIS — K8681 Exocrine pancreatic insufficiency: Secondary | ICD-10-CM | POA: Diagnosis not present

## 2015-03-26 DIAGNOSIS — Z01812 Encounter for preprocedural laboratory examination: Secondary | ICD-10-CM | POA: Diagnosis present

## 2015-03-26 DIAGNOSIS — Z951 Presence of aortocoronary bypass graft: Secondary | ICD-10-CM | POA: Insufficient documentation

## 2015-03-26 DIAGNOSIS — N838 Other noninflammatory disorders of ovary, fallopian tube and broad ligament: Secondary | ICD-10-CM

## 2015-03-26 DIAGNOSIS — Z853 Personal history of malignant neoplasm of breast: Secondary | ICD-10-CM | POA: Insufficient documentation

## 2015-03-26 DIAGNOSIS — D3912 Neoplasm of uncertain behavior of left ovary: Secondary | ICD-10-CM | POA: Insufficient documentation

## 2015-03-26 DIAGNOSIS — K869 Disease of pancreas, unspecified: Secondary | ICD-10-CM | POA: Diagnosis present

## 2015-03-26 DIAGNOSIS — E785 Hyperlipidemia, unspecified: Secondary | ICD-10-CM | POA: Diagnosis not present

## 2015-03-26 DIAGNOSIS — I2510 Atherosclerotic heart disease of native coronary artery without angina pectoris: Secondary | ICD-10-CM | POA: Insufficient documentation

## 2015-03-26 DIAGNOSIS — N839 Noninflammatory disorder of ovary, fallopian tube and broad ligament, unspecified: Secondary | ICD-10-CM | POA: Diagnosis present

## 2015-03-26 DIAGNOSIS — K8689 Other specified diseases of pancreas: Secondary | ICD-10-CM

## 2015-03-26 DIAGNOSIS — I10 Essential (primary) hypertension: Secondary | ICD-10-CM | POA: Insufficient documentation

## 2015-03-26 LAB — CBC WITH DIFFERENTIAL/PLATELET
BASOS ABS: 0 10*3/uL (ref 0.0–0.1)
BASOS PCT: 0 %
EOS ABS: 0.1 10*3/uL (ref 0.0–0.7)
EOS PCT: 1 %
HCT: 39.4 % (ref 36.0–46.0)
HEMOGLOBIN: 12.5 g/dL (ref 12.0–15.0)
LYMPHS ABS: 1.8 10*3/uL (ref 0.7–4.0)
Lymphocytes Relative: 17 %
MCH: 27.5 pg (ref 26.0–34.0)
MCHC: 31.7 g/dL (ref 30.0–36.0)
MCV: 86.6 fL (ref 78.0–100.0)
Monocytes Absolute: 1.3 10*3/uL — ABNORMAL HIGH (ref 0.1–1.0)
Monocytes Relative: 12 %
NEUTROS PCT: 70 %
Neutro Abs: 7.1 10*3/uL (ref 1.7–7.7)
PLATELETS: 329 10*3/uL (ref 150–400)
RBC: 4.55 MIL/uL (ref 3.87–5.11)
RDW: 13.3 % (ref 11.5–15.5)
WBC: 10.2 10*3/uL (ref 4.0–10.5)

## 2015-03-26 LAB — PROTIME-INR
INR: 1.18 (ref 0.00–1.49)
PROTHROMBIN TIME: 14.7 s (ref 11.6–15.2)

## 2015-03-26 MED ORDER — MIDAZOLAM HCL 2 MG/2ML IJ SOLN
INTRAMUSCULAR | Status: AC
  Filled 2015-03-26: qty 6

## 2015-03-26 MED ORDER — FENTANYL CITRATE (PF) 100 MCG/2ML IJ SOLN
INTRAMUSCULAR | Status: AC
  Filled 2015-03-26: qty 4

## 2015-03-26 MED ORDER — FENTANYL CITRATE (PF) 100 MCG/2ML IJ SOLN
INTRAMUSCULAR | Status: AC | PRN
  Administered 2015-03-26: 50 ug via INTRAVENOUS

## 2015-03-26 MED ORDER — MIDAZOLAM HCL 2 MG/2ML IJ SOLN
INTRAMUSCULAR | Status: AC | PRN
  Administered 2015-03-26: 1 mg via INTRAVENOUS
  Administered 2015-03-26: 0.5 mg via INTRAVENOUS
  Administered 2015-03-26: 1 mg via INTRAVENOUS

## 2015-03-26 MED ORDER — SODIUM CHLORIDE 0.9 % IV SOLN
INTRAVENOUS | Status: DC
  Administered 2015-03-26: 10:00:00 via INTRAVENOUS

## 2015-03-26 NOTE — Sedation Documentation (Signed)
Patient denies pain and is resting comfortably.  

## 2015-03-26 NOTE — Discharge Instructions (Signed)
Needle Biopsy, Care After °These instructions give you information about caring for yourself after your procedure. Your doctor may also give you more specific instructions. Call your doctor if you have any problems or questions after your procedure. °HOME CARE °· Rest as told by your doctor. °· Take medicines only as told by your doctor. °· There are many different ways to close and cover the biopsy site, including stitches (sutures), skin glue, and adhesive strips. Follow instructions from your doctor about: °¨ How to take care of your biopsy site. °¨ When and how you should change your bandage (dressing). °¨ When you should remove your dressing. °¨ Removing whatever was used to close your biopsy site. °· Check your biopsy site every day for signs of infection. Watch for: °¨ Redness, swelling, or pain. °¨ Fluid, blood, or pus. °GET HELP IF: °· You have a fever. °· You have redness, swelling, or pain at the biopsy site, and it lasts longer than a few days. °· You have fluid, blood, or pus coming from the biopsy site. °· You feel sick to your stomach (nauseous). °· You throw up (vomit). °GET HELP RIGHT AWAY IF: °· You are short of breath. °· You have trouble breathing. °· Your chest hurts. °· You feel dizzy or you pass out (faint). °· You have bleeding that does not stop with pressure or a bandage. °· You cough up blood. °· Your belly (abdomen) hurts. °  °This information is not intended to replace advice given to you by your health care provider. Make sure you discuss any questions you have with your health care provider. °  °Document Released: 01/24/2008 Document Revised: 06/27/2014 Document Reviewed: 02/06/2014 °Elsevier Interactive Patient Education ©2016 Elsevier Inc. ° ° ° °Moderate Conscious Sedation, Adult °Sedation is the use of medicines to promote relaxation and relieve discomfort and anxiety. Moderate conscious sedation is a type of sedation. Under moderate conscious sedation you are less alert than  normal but are still able to respond to instructions or stimulation. Moderate conscious sedation is used during short medical and dental procedures. It is milder than deep sedation or general anesthesia and allows you to return to your regular activities sooner. °LET YOUR HEALTH CARE PROVIDER KNOW ABOUT:  °· Any allergies you have. °· All medicines you are taking, including vitamins, herbs, eye drops, creams, and over-the-counter medicines. °· Use of steroids (by mouth or creams). °· Previous problems you or members of your family have had with the use of anesthetics. °· Any blood disorders you have. °· Previous surgeries you have had. °· Medical conditions you have. °· Possibility of pregnancy, if this applies. °· Use of cigarettes, alcohol, or illegal drugs. °RISKS AND COMPLICATIONS °Generally, this is a safe procedure. However, as with any procedure, problems can occur. Possible problems include: °· Oversedation. °· Trouble breathing on your own. You may need to have a breathing tube until you are awake and breathing on your own. °· Allergic reaction to any of the medicines used for the procedure. °BEFORE THE PROCEDURE °· You may have blood tests done. These tests can help show how well your kidneys and liver are working. They can also show how well your blood clots. °· A physical exam will be done.   °· Only take medicines as directed by your health care provider. You may need to stop taking medicines (such as blood thinners, aspirin, or nonsteroidal anti-inflammatory drugs) before the procedure.   °· Do not eat or drink at least 6 hours before the procedure or   as directed by your health care provider. °· Arrange for a responsible adult, family member, or friend to take you home after the procedure. He or she should stay with you for at least 24 hours after the procedure, until the medicine has worn off. °PROCEDURE  °· An intravenous (IV) catheter will be inserted into one of your veins. Medicine will be able to  flow directly into your body through this catheter. You may be given medicine through this tube to help prevent pain and help you relax. °· The medical or dental procedure will be done. °AFTER THE PROCEDURE °· You will stay in a recovery area until the medicine has worn off. Your blood pressure and pulse will be checked.   °·  Depending on the procedure you had, you may be allowed to go home when you can tolerate liquids and your pain is under control. °  °This information is not intended to replace advice given to you by your health care provider. Make sure you discuss any questions you have with your health care provider. °  °Document Released: 11/05/2000 Document Revised: 03/03/2014 Document Reviewed: 10/18/2012 °Elsevier Interactive Patient Education ©2016 Elsevier Inc. ° °

## 2015-03-26 NOTE — Progress Notes (Signed)
Patient ID: Tammy Schmitt, female   DOB: 05-Jun-1940, 75 y.o.   MRN: [id number]    Referring Physician(s): Rossi,Emma  Chief Complaint: Left ovarian mass   Subjective:  Patient familiar to IR service from prior attempted CT-guided biopsy of peritoneal nodules on 02/22/15 . Patient has history of remote breast cancer and clinical stage IV pancreatic cancer with prior unsuccessful attempt at endoscopic and omental biopsies. She presents today for CT-guided core biopsy of a left ovarian mass for further evaluation. She currently denies fevers/chills, headaches, chest pain, dyspnea, cough, vomiting, or abnormal bleeding. She does have generalized abdominal discomfort, back discomfort and occasional nausea.   Allergies: Statins; Ace inhibitors; Codeine; and Levaquin  Medications: Prior to Admission medications   Medication Sig Start Date End Date Taking? Authorizing Provider  aspirin EC 81 MG tablet Take 1 tablet (81 mg total) by mouth daily. 11/08/14  Yes Thayer Headings, MD  diphenhydramine-acetaminophen (TYLENOL PM) 25-500 MG TABS tablet Take 1 tablet by mouth at bedtime.   Yes Historical Provider, MD  HYDROmorphone (DILAUDID) 2 MG tablet Take 0.5 tablets (1 mg total) by mouth every 4 (four) hours as needed for severe pain. 03/23/15  Yes Brunetta Genera, MD  ibuprofen (ADVIL,MOTRIN) 200 MG tablet Take 400 mg by mouth 2 (two) times daily as needed for moderate pain.   Yes Historical Provider, MD  losartan (COZAAR) 50 MG tablet Take 1 tablet (50 mg total) by mouth daily. 11/08/14  Yes Thayer Headings, MD  metoprolol tartrate (LOPRESSOR) 25 MG tablet Take 0.5 tablets (12.5 mg total) by mouth 2 (two) times daily. 09/28/14  Yes Shawnee Knapp, MD  omeprazole (PRILOSEC) 40 MG capsule Take 1 capsule (40 mg total) by mouth 2 (two) times daily. 01/24/15  Yes Irene Shipper, MD  acetaminophen (TYLENOL) 500 MG tablet Take 1,000 mg by mouth 2 (two) times daily as needed for headache.     Historical  Provider, MD  budesonide-formoterol (SYMBICORT) 160-4.5 MCG/ACT inhaler Inhale 2 puffs into the lungs 2 (two) times daily as needed (for bronchitis). Reported on 03/19/2015    Historical Provider, MD  LORazepam (ATIVAN) 0.5 MG tablet Take 0.25 mg by mouth daily as needed for anxiety.     Historical Provider, MD  nitroGLYCERIN (NITROSTAT) 0.4 MG SL tablet Place 0.4 mg under the tongue every 5 (five) minutes as needed for chest pain. Reported on 03/19/2015    Historical Provider, MD  Pancrelipase, Lip-Prot-Amyl, 24000 units CPEP Take 1 capsule (24,000 Units total) by mouth 3 (three) times daily with meals. 03/22/15   Brunetta Genera, MD  Polyethyl Glycol-Propyl Glycol (SYSTANE OP) Apply 1 drop to eye daily as needed (for dry eyes).    Historical Provider, MD  Polyvinyl Alcohol-Povidone (REFRESH OP) Apply 1 drop to eye daily as needed (for dry eyes).    Historical Provider, MD     Vital Signs: Blood pressure 116/74, heart rate 89, temp 97.7, respirations 18, oxygen saturation 98% room air  Physical Exam  Constitutional: She is oriented to person, place, and time. She appears well-developed and well-nourished.  Cardiovascular: Normal rate and regular rhythm.   Pulmonary/Chest: Effort normal and breath sounds normal.  Abdominal: Soft. Bowel sounds are normal. There is tenderness.  Musculoskeletal: Normal range of motion. She exhibits no edema.  Neurological: She is alert and oriented to person, place, and time.    Imaging: No results found.  Labs:  CBC:  Recent Labs  11/15/14 1538 02/22/15 0809  WBC 7.7 7.8  HGB 14.3 13.6  HCT 44.2 41.8  PLT  --  283    COAGS:  Recent Labs  02/22/15 0809  INR 1.08  APTT 28    BMP:  Recent Labs  11/08/14 1054 11/15/14 1515 11/29/14 0819 01/31/15 1548 01/31/15 1551  NA 137 139 136  --  141  K 4.1 4.0 4.0  --  4.4  CL 103 101 106  --  103  CO2 24 26 25   --  30  GLUCOSE 87 71 94  --  94  BUN 21 14 20 13 13   CALCIUM 9.5 9.5 9.2   --  9.6  CREATININE 0.54 0.58* 0.62 0.52 0.52    LIVER FUNCTION TESTS:  Recent Labs  11/08/14 1054 11/15/14 1515 01/31/15 1551  BILITOT 0.5 0.5 0.4  AST 17 21 16   ALT 9 11 8   ALKPHOS 77 88 89  PROT 7.1 7.4 7.0  ALBUMIN 4.1 4.6 3.9    Assessment and Plan: Pt with remote breast cancer and clinical stage IV pancreatic cancer with prior unsuccessful attempt at endoscopic and omental biopsies. She presents today for CT-guided core biopsy of a left ovarian mass for further evaluation.Risks and benefits discussed with the patient including, but not limited to bleeding, infection, damage to adjacent structures or low yield requiring additional tests.All of the patient's questions were answered, patient is agreeable to proceed.Consent signed and in chart.     Electronically Signed: D. Rowe Robert 03/26/2015, 9:45 AM   I spent a total of 15 minutes at the the patient's bedside AND on the patient's hospital floor or unit, greater than 50% of which was counseling/coordinating care for CT-guided biopsy of left ovarian mass

## 2015-03-26 NOTE — Procedures (Signed)
L ovarian mass Bx No comp/EBL

## 2015-03-29 ENCOUNTER — Ambulatory Visit
Admission: RE | Admit: 2015-03-29 | Discharge: 2015-03-29 | Disposition: A | Discharge status: Home / Self Care | Payer: Medicare HMO | Source: Ambulatory Visit | Attending: Hematology | Admitting: Hematology

## 2015-03-29 DIAGNOSIS — C786 Secondary malignant neoplasm of retroperitoneum and peritoneum: Secondary | ICD-10-CM | POA: Insufficient documentation

## 2015-03-29 DIAGNOSIS — R188 Other ascites: Secondary | ICD-10-CM | POA: Insufficient documentation

## 2015-03-29 DIAGNOSIS — R918 Other nonspecific abnormal finding of lung field: Secondary | ICD-10-CM | POA: Insufficient documentation

## 2015-03-29 DIAGNOSIS — K449 Diaphragmatic hernia without obstruction or gangrene: Secondary | ICD-10-CM | POA: Diagnosis not present

## 2015-03-29 DIAGNOSIS — Z853 Personal history of malignant neoplasm of breast: Secondary | ICD-10-CM | POA: Diagnosis not present

## 2015-03-29 DIAGNOSIS — K869 Disease of pancreas, unspecified: Secondary | ICD-10-CM | POA: Insufficient documentation

## 2015-03-29 DIAGNOSIS — K8689 Other specified diseases of pancreas: Secondary | ICD-10-CM

## 2015-03-29 DIAGNOSIS — Z9221 Personal history of antineoplastic chemotherapy: Secondary | ICD-10-CM | POA: Diagnosis not present

## 2015-03-29 LAB — GLUCOSE, CAPILLARY: Glucose-Capillary: 98 mg/dL (ref 65–99)

## 2015-03-29 MED ORDER — FLUDEOXYGLUCOSE F - 18 (FDG) INJECTION
12.2700 | Freq: Once | INTRAVENOUS | Status: AC | PRN
  Administered 2015-03-29: 12.27 via INTRAVENOUS

## 2015-04-02 ENCOUNTER — Encounter: Payer: Self-pay | Admitting: *Deleted

## 2015-04-02 ENCOUNTER — Other Ambulatory Visit: Payer: Self-pay | Admitting: *Deleted

## 2015-04-02 ENCOUNTER — Ambulatory Visit (HOSPITAL_BASED_OUTPATIENT_CLINIC_OR_DEPARTMENT_OTHER): Payer: Medicare HMO | Admitting: Hematology

## 2015-04-02 ENCOUNTER — Telehealth: Payer: Self-pay | Admitting: Cardiovascular Disease

## 2015-04-02 ENCOUNTER — Other Ambulatory Visit (HOSPITAL_BASED_OUTPATIENT_CLINIC_OR_DEPARTMENT_OTHER): Payer: Medicare HMO

## 2015-04-02 VITALS — BP 114/49 | HR 73 | Temp 98.0°F | Resp 17 | Ht <= 58 in | Wt 147.4 lb

## 2015-04-02 DIAGNOSIS — K668 Other specified disorders of peritoneum: Secondary | ICD-10-CM

## 2015-04-02 DIAGNOSIS — K8689 Other specified diseases of pancreas: Secondary | ICD-10-CM

## 2015-04-02 DIAGNOSIS — N838 Other noninflammatory disorders of ovary, fallopian tube and broad ligament: Secondary | ICD-10-CM

## 2015-04-02 DIAGNOSIS — K869 Disease of pancreas, unspecified: Secondary | ICD-10-CM

## 2015-04-02 LAB — CBC & DIFF AND RETIC
BASO%: 0.2 % (ref 0.0–2.0)
Basophils Absolute: 0 10*3/uL (ref 0.0–0.1)
EOS ABS: 0.1 10*3/uL (ref 0.0–0.5)
EOS%: 0.5 % (ref 0.0–7.0)
HEMATOCRIT: 36.9 % (ref 34.8–46.6)
HEMOGLOBIN: 11.9 g/dL (ref 11.6–15.9)
Immature Retic Fract: 10 % (ref 1.60–10.00)
LYMPH#: 1.6 10*3/uL (ref 0.9–3.3)
LYMPH%: 15.7 % (ref 14.0–49.7)
MCH: 27.1 pg (ref 25.1–34.0)
MCHC: 32.2 g/dL (ref 31.5–36.0)
MCV: 84.1 fL (ref 79.5–101.0)
MONO#: 1 10*3/uL — AB (ref 0.1–0.9)
MONO%: 9.9 % (ref 0.0–14.0)
NEUT#: 7.6 10*3/uL — ABNORMAL HIGH (ref 1.5–6.5)
NEUT%: 73.7 % (ref 38.4–76.8)
PLATELETS: 308 10*3/uL (ref 145–400)
RBC: 4.39 10*6/uL (ref 3.70–5.45)
RDW: 13.4 % (ref 11.2–14.5)
Retic %: 1.23 % (ref 0.70–2.10)
Retic Ct Abs: 54 10*3/uL (ref 33.70–90.70)
WBC: 10.3 10*3/uL (ref 3.9–10.3)

## 2015-04-02 LAB — COMPREHENSIVE METABOLIC PANEL
ALBUMIN: 2.9 g/dL — AB (ref 3.5–5.0)
ALK PHOS: 90 U/L (ref 40–150)
ALT: <9 U/L (ref 0–55)
ANION GAP: 12 meq/L — AB (ref 3–11)
AST: 16 U/L (ref 5–34)
BUN: 10.8 mg/dL (ref 7.0–26.0)
CALCIUM: 9.3 mg/dL (ref 8.4–10.4)
CHLORIDE: 102 meq/L (ref 98–109)
CO2: 25 mEq/L (ref 22–29)
CREATININE: 0.7 mg/dL (ref 0.6–1.1)
EGFR: 87 mL/min/{1.73_m2} — ABNORMAL LOW (ref >90)
Glucose: 114 mg/dl (ref 70–140)
POTASSIUM: 3.4 meq/L — AB (ref 3.5–5.1)
Sodium: 139 mEq/L (ref 136–145)
Total Bilirubin: 0.36 mg/dL (ref 0.20–1.20)
Total Protein: 6.8 g/dL (ref 6.4–8.3)

## 2015-04-02 NOTE — Telephone Encounter (Signed)
Patient is concerned with her BP running lower (114/49) HR 73 VS 1/26 BP 121/57 HR 70 VS 1/23 BP 124/82 HR 82 She isn't eating much because of her elevated pancreatic enzymes She is concerned that she is on too much BP medications and wants to know is she should cut back  Is on Cozaar 50 mg daily and Lopressor 12.5 mg bid

## 2015-04-02 NOTE — Progress Notes (Signed)
Oncology Nurse Navigator Documentation  Oncology Nurse Navigator Flowsheets 04/02/2015  Navigator Location [facility]  Navigator Encounter Type Follow-up Appt  Telephone -  Abnormal Finding Date -  Patient Visit Type MedOnc  Treatment Phase Pre-Tx/Tx Discussion--still no definitive tissue dx  Barriers/Navigation Needs Coordination of Care--[facility]  Education Accessing Care/ Finding Providers  Interventions Other--inbasket message to Dr. Audie Pinto PA to inquire about 1st available for surgeon to see to arrange lap bx. (also faxed referral)  Coordination of Care Appts  Support Groups/Services -  Acuity -  Time Spent with Patient 30

## 2015-04-02 NOTE — Telephone Encounter (Signed)
New Message  Pt c/o BP issue:  1. What are your last 5 BP readings?   Just the one today 118/49. This was taken at [facility].   2. Are you having any other symptoms (ex. Dizziness, headache, blurred vision, passed out)? No not yet. But this is her concern. Because she takes two medications for BP and she is requesting a call back to discuss if she should stop taking one.   3. What is your medication issue?  But this is her concern. Because she takes two medications for BP and she is requesting a call back to discuss if she should stop taking one.

## 2015-04-03 ENCOUNTER — Telehealth: Payer: Self-pay | Admitting: *Deleted

## 2015-04-03 ENCOUNTER — Telehealth: Payer: Self-pay | Admitting: Hematology

## 2015-04-03 DIAGNOSIS — K8689 Other specified diseases of pancreas: Secondary | ICD-10-CM

## 2015-04-03 NOTE — Telephone Encounter (Signed)
OK to DC the Losartan  Continue to monitor

## 2015-04-03 NOTE — Telephone Encounter (Signed)
per pof to sch pt appt-cld [facility] and they stated pt already sch for 2/10-cld pt to give appt for nut time & date-adv pt to call after surgical biospsy is sch so we can sch he back w/Dr Irene Limbo per pof

## 2015-04-03 NOTE — Telephone Encounter (Signed)
Left detailed message on patient's voice mail that per Dr. Acie Fredrickson she may d/c Cozaar (losartan) 50 mg and continue to monitor.  I advised her to call back with questions or concerns.

## 2015-04-03 NOTE — Telephone Encounter (Signed)
Caller asked for GI Navigator.  "I saw Dr. Irene Limbo yesterday and have decided to have palliative care.  I received the information packet.  Was told someone there could help me complete this.  I also need help with a WILL, and funeral arrangements."  Call transferred to extension [phone].  Received voicemail.   This message routed to providers pool, social work and navigator patient reports receiving this information.

## 2015-04-04 NOTE — Telephone Encounter (Signed)
Oncology Nurse Navigator Documentation  Oncology Nurse Navigator Flowsheets 04/04/2015  Navigator Location [facility]  Navigator Encounter Type Telephone  Telephone Outgoing Call;Appt Confirmation/Clarification--confirmed for her w/CCS that her appointment on 2/10 is at [place] office.  Abnormal Finding Date -  Patient Visit Type -  Treatment Phase -  Barriers/Navigation Needs -  Education -  Interventions Referrals  Referrals Palliative Care-Made patient aware that the referral she requested has been made. Call back by end of week if she has not heard from them.  Coordination of Care -  Support Groups/Services -  Acuity Level 2  Time Spent with Patient 30

## 2015-04-06 ENCOUNTER — Ambulatory Visit: Payer: Medicare HMO | Admitting: Nutrition

## 2015-04-06 ENCOUNTER — Other Ambulatory Visit: Payer: Self-pay | Admitting: General Surgery

## 2015-04-06 NOTE — Progress Notes (Addendum)
Anesthesia Chart Review: Patient is a 75 year old female posted for laparoscopic diagnostic biopsies of pancreatic mass on 04/11/15 by Tammy Schmitt. She had an incidental finding of a infiltrating retroperitoneal mass strongly favored to be pancreatic in origin on 02/09/15 CT scan for evaluation of abdominal pain. It is felt that she likely has metastatic pancreatic cancer, but numerous percutaneous and EUS biopsies have been non diagnostic. Tammy Schmitt did seek input from patient's cardiologist Tammy Schmitt.  I received a routed email from cardiologist Tammy Schmitt to Tammy Schmitt stating, " She is at low risk for CV complications."   PAT iwas 04/10/15 at 11AM.   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 PCP is Tammy Peng, Tammy Schmitt, established care 02/21/15. GI is Tammy Schmitt. GYN-ONC is Tammy Schmitt. HEM-ONC is Tammy Schmitt with [facility]. Cardiologist is Tammy Schmitt, established care 11/08/14; last visit 02/12/15.   Meds include ASA (on hold), Symbicort, Dilaudid, Lopressor, Nitro, Prilosec, Pancrelipase. Tammy Schmitt recently (04/03/15) had her hold losartan due to hypotension (particularly diastolic readings, some in the 40's).  12/26/14 EKG: SR, old inferior infarct.  Scanned records from [facility] include: - 01/27/14 Echo: Mild concentric LVH. Mild-moderate MR. Moderate LAE. Mild TR. Mild septal hypokinesia with overall satisfactory LV systolic function. LVEF 55%.  - 12/09/11 Nuclear stress test: Mild fixed lateral defect consistent with prior MI, no ischemia. Cine gated imaging demonstrated septal and global hypokinesis. LVEF 40%.  - 12/01/12 Carotid U/S: Mild (up to 40-50%) bilateral carotid bulb/ICA stenosis. Antegrade vertebral artery flow, bilaterally.  02/09/15 CT abdomen w/ contrast:  IMPRESSION: 1. 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 There is a suspicious 6 mm lesion in segment 7 of the liver, potentially metastatic. In addition, there appears to be widespread intraperitoneal metastasis and a trace volume of ascites which may be malignant ascites. Correlation with EGD for further evaluation and biopsy is recommended at this time. 2. Additional incidental findings, as above. (See full report under Results Review tab.)  Labs on 04/02/15 noted.  She was hypotensive (109/49) at PAT this morning. I wasn't asked to evaluate patient, and unfortunately BP was not rechecked. (Her previous BP readings in Epic on 04/02/15 was 114/49 and 91/50 on 03/26/15. Losartan is now on hold for the past week.) Patient denied CV symptoms per her PAT RN. H/H WNL on 04/02/15. She will get vitals on arrival tomorrow. If vitals stable and no acute issues then I would anticipate that she can proceed as planned.  Tammy Schmitt [facility]/Anesthesiology Phone [phone] 04/10/2015 11:54 AM

## 2015-04-06 NOTE — Progress Notes (Signed)
75 year old female diagnosed with pancreas mass and ovarian mass.  She is a patient of Dr. Irene Limbo.  Past medical history includes CHF, GERD, MI, CAD, hyperlipidemia, diverticulitis, hypertension, obesity, COPD, and breast cancer in 2001.  Medications include Ativan, Prilosec, pancrelipase.  Labs include potassium 3.4, and albumin 2.9 on February 6. Height: 4 feet 10 inches. Weight: 147.4 pounds February 6. Usual body weight: 157 pounds November 2016. BMI: 30.81.  Patient reports she has poor appetite with early satiety. She states she has occasional nausea but no vomiting. She feels constipated. States she has just started enzymes with meals. Patient reports she tried oral nutrition supplement and it gave her diarrhea.  This was before taking enzymes. Patient is overwhelmed with information/diagnosis.  Nutrition diagnosis: Food and nutrition related information related to new pancreas mass as evidenced by no prior need for nutrition related information.  Intervention: Educated patient to consume small frequent meals and snacks with a protein source at every meal and snack. Reviewed fact sheet increasing calories and protein. Encouraged patient to try to maintain current weight. Reviewed strategies for improving constipation, and provided a fact sheet.. Explained the role of enzymes and provided a fact sheet. Questions were answered.  Teach back method used.  Nutrition samples were provided.  Contact information was given.  Monitoring, evaluation, goals: Patient will tolerate adequate calories and protein with increased fluids to promote weight maintenance.  Next visit: Patient will contact me for questions or concerns.  **Disclaimer: This note was dictated with voice recognition software. Similar sounding words can inadvertently be transcribed and this note may contain transcription errors which may not have been corrected upon publication of note.**

## 2015-04-07 ENCOUNTER — Encounter: Payer: Self-pay | Admitting: Hematology

## 2015-04-07 NOTE — Progress Notes (Signed)
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 We discussed that it showed an alternative benign appearing Ovarian pathology that likely does not reflect the etiology of her pancreatic mass. We discuss the option of considering a surgical laparoscopic biopsy in the setting of multiple unsuccessful attempts at obtaining a tissue diagnosis. She was given a referral to [facility] to consider this. Her pain symptoms are better controlled at this time. Trying to maintain decent PO intake. No other acute new symptoms.   MEDICAL HISTORY:  Past Medical History  Diagnosis Date  . Cataract   . CHF (congestive heart failure) ([place])   . GERD (gastroesophageal reflux disease)   . Myocardial infarction ([place]) 2001  . CAD (coronary artery disease)   . HLD (hyperlipidemia)   . Diverticulitis   . Colon polyp   . Hypertension   . Hemorrhoids   . Obesity   . Cancer ([place]) 2000    breast  . DDD (degenerative disc disease), lumbar   . Prolapsed uterus   . Family history of adverse reaction to anesthesia     maternal aunt came out of surgery not knowing what happened and where she was  . COPD (chronic obstructive pulmonary disease) ([place])   . Hyperlipidemia   . Mitral regurgitation   . Colon polyp   . Breast cancer ([place]) 2001    SURGICAL HISTORY: Past Surgical History  Procedure Laterality Date  . Breast surgery Left     lymph nodes removed also  . Eye surgery Bilateral     cataracts removed    . Ovary surgery      one  tube removed , one ovary trimmed down  . Heart bypass      x 2, stent  . Coronary artery bypass graft  2001    x 2  . Cardiac catheterization  2001  . Eus N/A 03/08/2015    Procedure: UPPER ENDOSCOPIC ULTRASOUND (EUS) LINEAR;  Surgeon: Milus Banister, MD;  Location: [facility];  Service: Endoscopy;  Laterality: N/A;  radial linear    SOCIAL HISTORY: Social History   Social History  . Marital Status: Divorced    Spouse Name: N/A  . Number of Children: 3  . Years of Education: N/A   Occupational History  . retired    Social History Main Topics  . Smoking status: Former Smoker    Types: Cigarettes    Quit date: 12/26/1999  . Smokeless tobacco: Never Used  . Alcohol Use: No  . Drug Use: No  . Sexual Activity: Not on file   Other Topics Concern  . Not on file   Social History Narrative   Retired from working with disabled individuals    Two sons and one daughter (daughter and one son have [place]   Divorced, lives alone; moved to [place] from [place] May 2016   She likes to Psychologist, occupational and exercise.     FAMILY HISTORY: Family History  Problem Relation Age of Onset  . Breast cancer Mother 25  .  Heart disease Father   . Heart disease Brother   . Esophageal cancer Daughter 82    died at 73  . Leukemia Son 66  . Colon cancer Neg Hx   . Stomach cancer Neg Hx     ALLERGIES:  is allergic to statins; ace inhibitors; codeine; and levaquin.  MEDICATIONS:  Current Outpatient Prescriptions  Medication Sig Dispense Refill  . acetaminophen (TYLENOL) 500 MG tablet Take 1,000 mg by mouth 2 (two) times daily as needed for headache.     Marland Kitchen aspirin EC 81 MG tablet Take 1 tablet (81 mg total) by mouth daily.    . budesonide-formoterol (SYMBICORT) 160-4.5 MCG/ACT inhaler Inhale 2 puffs into the lungs 2 (two) times daily as needed (for bronchitis). Reported on 03/19/2015    . diphenhydramine-acetaminophen (TYLENOL PM) 25-500 MG TABS tablet Take 1 tablet by mouth at  bedtime.    Marland Kitchen HYDROmorphone (DILAUDID) 2 MG tablet Take 0.5 tablets (1 mg total) by mouth every 4 (four) hours as needed for severe pain. 30 tablet 0  . ibuprofen (ADVIL,MOTRIN) 200 MG tablet Take 400 mg by mouth 2 (two) times daily as needed for moderate pain.    Marland Kitchen LORazepam (ATIVAN) 0.5 MG tablet Take 0.25 mg by mouth daily as needed for anxiety.     Marland Kitchen losartan (COZAAR) 50 MG tablet Take 1 tablet (50 mg total) by mouth daily. 31 tablet 11  . metoprolol tartrate (LOPRESSOR) 25 MG tablet Take 0.5 tablets (12.5 mg total) by mouth 2 (two) times daily. 90 tablet 3  . nitroGLYCERIN (NITROSTAT) 0.4 MG SL tablet Place 0.4 mg under the tongue every 5 (five) minutes as needed for chest pain. Reported on 03/19/2015    . omeprazole (PRILOSEC) 40 MG capsule Take 1 capsule (40 mg total) by mouth 2 (two) times daily. 60 capsule 11  . Pancrelipase, Lip-Prot-Amyl, 24000 units CPEP Take 1 capsule (24,000 Units total) by mouth 3 (three) times daily with meals. 180 capsule 1  . Polyethyl Glycol-Propyl Glycol (SYSTANE OP) Apply 1 drop to eye daily as needed (for dry eyes).    . Polyvinyl Alcohol-Povidone (REFRESH OP) Apply 1 drop to eye daily as needed (for dry eyes).     No current facility-administered medications for this visit.    REVIEW OF SYSTEMS:    10 Point review of Systems was done is negative except as noted above.  PHYSICAL EXAMINATION: ECOG PERFORMANCE STATUS: 1 - Symptomatic but completely ambulatory  . Filed Vitals:   04/02/15 1248  BP: 114/49  Pulse: 73  Temp: 98 F (36.7 C)  Resp: 17   Filed Weights   04/02/15 1248  Weight: 147 lb 6.4 oz (66.86 kg)   .Body mass index is 30.81 kg/(m^2).  GENERAL:alert, mild distress due to pain and uncertainty of diagnosis. SKIN: skin color, texture, turgor are normal, no rashes or significant lesions EYES: normal, conjunctiva are pink and non-injected, sclera clear OROPHARYNX:no exudate, no erythema and lips, buccal mucosa, and tongue normal    NECK: supple, no JVD, thyroid normal size, non-tender, without nodularity LYMPH:  no palpable lymphadenopathy in the cervical, axillary or inguinal LUNGS: clear to auscultation with normal respiratory effort HEART: regular rate & rhythm,  no murmurs and no lower extremity edema ABDOMEN: TTP epigastric regions, normoactive BM no g/r/r Musculoskeletal: no cyanosis of digits and no clubbing  PSYCH: alert & oriented x 3 with fluent speech NEURO: no focal motor/sensory deficits  LABORATORY DATA:  I have reviewed the data as listed  . CBC  Latest Ref Rng 04/02/2015 03/26/2015 02/22/2015  WBC 3.9 - 10.3 10e3/uL 10.3 10.2 7.8  Hemoglobin 11.6 - 15.9 g/dL 11.9 12.5 13.6  Hematocrit 34.8 - 46.6 % 36.9 39.4 41.8  Platelets 145 - 400 10e3/uL 308 329 283    . CMP Latest Ref Rng 04/02/2015 01/31/2015 01/31/2015  Glucose 70 - 140 mg/dl 114 94 -  BUN 7.0 - 26.0 mg/dL 10.8 13 13   Creatinine 0.6 - 1.1 mg/dL 0.7 0.52 0.52  Sodium 136 - 145 mEq/L 139 141 -  Potassium 3.5 - 5.1 mEq/L 3.4(L) 4.4 -  Chloride 96 - 112 mEq/L - 103 -  CO2 22 - 29 mEq/L 25 30 -  Calcium 8.4 - 10.4 mg/dL 9.3 9.6 -  Total Protein 6.4 - 8.3 g/dL 6.8 7.0 -  Total Bilirubin 0.20 - 1.20 mg/dL 0.36 0.4 -  Alkaline Phos 40 - 150 U/L 90 89 -  AST 5 - 34 U/L 16 16 -  ALT 0 - 55 U/L <9 8 -   Component     Latest Ref Rng 03/19/2015  CEA     0.0 - 4.7 ng/mL 2.1  Cancer Antigen (CA) 125     0.0 - 38.1 U/mL 481.7 (H)  CA 19-9     0 - 35 U/mL 953 (H)  \     RADIOGRAPHIC STUDIES: I have personally reviewed the radiological images as listed and agreed with the findings in the report. Nm Pet Image Initial (pi) Skull Base To Thigh  03/29/2015  CLINICAL DATA:  Initial treatment strategy for pancreatic mass. History of left breast cancer 15 years ago with chemotherapy and radiation therapy. Biopsy of left ovary last week. EXAM: NUCLEAR MEDICINE PET SKULL BASE TO THIGH TECHNIQUE: 12.3 mCi F-18 FDG was injected intravenously. Full-ring PET  imaging was performed from the skull base to thigh after the radiotracer. CT data was obtained and used for attenuation correction and anatomic localization. FASTING BLOOD GLUCOSE:  Value: 98 mg/dl COMPARISON:  Abdominal CT of 02/09/2015. FINDINGS: NECK No areas of abnormal hypermetabolism. CHEST Left suprahilar hypermetabolism along the pulmonary artery is favored to be vascular and is without correlate adenopathy. This measures a S.U.V. max of 3.4 including on image 75/ series 3. ABDOMEN/PELVIS Hypermetabolism corresponding to the soft tissue mass centered about the inferior most aspect of the uncinate process of the pancreas and adjacent transverse duodenum. This is not well visualized on today's CT, but measures a S.U.V. max of 11.6, including on image 139/ series 3. Extensive omental/ peritoneal metastasis. This is progressive since the prior diagnostic CT. An example omental nodule measures 1.2 cm and a S.U.V. max of 5.1 on image 122/series 3. More inferior omental nodule is new or significantly enlarged since the prior exam, measures 1.3 cm and a S.U.V. max of 5.9 on image 131/series 3. Innumerable omental nodules are identified concurrent with hypermetabolism more inferiorly, including on image 162/ series 3. Small bowel mesenteric nodules or nodes which are hypermetabolic. Example at 8 mm and a S.U.V. max of 4.5 on image 157/ series 3. Left ovarian mass demonstrates relatively low-level hypermetabolism. This measures 4.2 x 3.5 cm and a S.U.V. max of 3.5 on image 197/series 3. SKELETON No abnormal marrow activity. CT IMAGES PERFORMED FOR ATTENUATION CORRECTION Bilateral carotid atherosclerosis.  No cervical adenopathy. Prior median sternotomy. Mild cardiomegaly. Moderate hiatal hernia. Trace right pleural fluid. Moderate centrilobular emphysema. Volume loss in inferior right upper lobe. Right lower lobe 7 mm nodule on image 82/series 3. Left lower lobe 6  mm nodule on image 79/series 3. Vague 4 mm left upper  lobe pulmonary nodule. Development of moderate volume abdominal ascites. No bowel obstruction or other acute complication. Advanced abdominal aortic atherosclerosis. Pessary in place. IMPRESSION: 1. Hypermetabolism corresponding to ill-defined soft tissue mass arising either from the uncinate process of the pancreas or the transverse duodenum. This is consistent with primary malignancy. 2. Moderate to marked progression of widespread omental/peritoneal metastasis. Development of moderate volume ascites. Nodal metastasis within the small bowel mesentery. If successful tissue sampling has not been performed, laparoscopy may be necessary (presuming endoscopic ultrasound of the presumed primary is not possible). 3. Low-level hypermetabolism in the left ovary is favored to represent a a synchronous indolent and likely benign neoplasm, given the biopsy results suggesting a fibroma. 4. Bilateral lung nodules. Although technically indeterminate, suspicious for pulmonary metastasis. 5. Trace right pleural fluid. 6. Hiatal hernia. Electronically Signed   By: Abigail Miyamoto M.D.   On: 03/29/2015 12:15   Ct Biopsy  03/26/2015  INDICATION: Left ovarian mass EXAM: CT BIOPSY MEDICATIONS: None. ANESTHESIA/SEDATION: Fentanyl 50 mcg IV; Versed 2.5 mg IV Moderate Sedation Time:  18 The patient was continuously monitored during the procedure by the interventional radiology nurse under my direct supervision. FLUOROSCOPY TIME:  None COMPLICATIONS: None immediate. PROCEDURE: Informed written consent was obtained from the patient after a thorough discussion of the procedural risks, benefits and alternatives. All questions were addressed. Maximal Sterile Barrier Technique was utilized including caps, mask, sterile gowns, sterile gloves, sterile drape, hand hygiene and skin antiseptic. A timeout was performed prior to the initiation of the procedure. Under CT guidance, a(n) 17 gauge guide needle was advanced into the left ovarian mass via  left trans gluteal approach. Subsequently 2 18 gauge core biopsies were obtained. The guide needle was removed. Post biopsy images demonstrate no hemorrhage. Patient tolerated the procedure well without complication. Vital sign monitoring by nursing staff during the procedure will continue as patient is in the special procedures unit for post procedure observation. The images document guide needle placement within the left ovarian mass. Post biopsy images demonstrate no hemorrhage. IMPRESSION: Successful core biopsy of a left ovarian mass. Electronically Signed   By: Marybelle Killings M.D.   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 EUS bx - atypical cells not definitive diagnosis of pancreatic adenocarcinoma -though this is the some likely diagnosis. CA 19-9 elevated. 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 2) Neoplasm related abdominal pain 3) Pancreatic exocrine deficiency 4) significant h/o CAD on ASA  Plan -continue  dilaudid for pain mx with bowel prophylaxis -creon for pancreatic exocrine insufficiency -referral given for consideration of laparoscopic surgical biopsy for tissue diagnosis. -continue f/u with PCP for continue cares of other medical issues.  4) h/o left breast hormone positive cancer 18 yrs ago at [facility] in [place] about 34yrs ago. She notes that she was treated with a left sided lumpectomy, SLNB, adjuvant chemotherapy and RT and tamoxifen which she took only for 1 yr. -no current evidence of breast cancer recurrence.  RTC in 3 days post surgical biopsy with Dr Irene Limbo with cbc, cmp.  I spent 15  minutes counseling  the patient face to face. The total time spent in the appointment was 20 minutes and more than 50% was on counseling and direct patient cares.    Sullivan Lone MD Ester AAHIVMS [facility] Hematology/Oncology Physician [facility]  (Office):       [phone] (Work cell):  [phone] (Fax):           [phone]

## 2015-04-09 ENCOUNTER — Other Ambulatory Visit: Payer: Self-pay | Admitting: *Deleted

## 2015-04-09 MED ORDER — HYDROMORPHONE HCL 2 MG PO TABS: 2.0000 mg | ORAL_TABLET | ORAL | Status: DC | PRN

## 2015-04-09 MED ORDER — HYDROMORPHONE HCL 2 MG PO TABS: 1.0000 mg | ORAL_TABLET | ORAL | Status: DC | PRN

## 2015-04-09 MED ORDER — HYDROCODONE-ACETAMINOPHEN 5-325 MG PO TABS: 1.0000 | ORAL_TABLET | ORAL | Status: DC | PRN

## 2015-04-09 NOTE — Pre-Procedure Instructions (Signed)
    Tammy Schmitt  04/09/2015    Your procedure is scheduled on Wednesday, February 15.  Report to [facility] Admitting at 11:45 A.M.                 Your surgery or procedure is scheduled for 1:45 PM                  Call this number if you have problems the morning of [phone]   Remember:  Do not eat food or drink liquids after midnight.  Take these medicines the morning of surgery with A SIP OF WATER :metoprolol tartrate (LOPRESSOR),  omeprazole (PRILOSEC).                  Take if needed :Nitroglycerin, HYDROmorphone (DILAUDID) or Tylenol.                  Stop taking Aspirin, Coumadin, Plavix, Effient and Herbal medications, Vitamins.  Do not take any NSAIDs ie: Ibuprofen,  Advil,Naproxen or any medication containing Aspirin.   Do not wear jewelry, make-up or nail polish.  Do not wear lotions, powders, or perfumes.    Do not shave 48 hours prior to surgery.    Do not bring valuables to the hospital.  [facility] is not responsible for any belongings or valuables.  Contacts, dentures or bridgework may not be worn into surgery.  Leave your suitcase in the car.  After surgery it may be brought to your room.  For patients admitted to the hospital, discharge time will be determined by your treatment team.  Patients discharged the day of surgery will not be allowed to drive home.   Name and phone number of your driver:   -  Special instructions:  Review  [place] - Preparing For Surgery.  Please read over the following fact sheets that you were given. Pain Booklet, Coughing and Deep Breathing and Surgical Site Infection Prevention

## 2015-04-10 ENCOUNTER — Encounter (HOSPITAL_COMMUNITY)
Admission: RE | Admit: 2015-04-10 | Discharge: 2015-04-10 | Disposition: A | Discharge status: Home / Self Care | Payer: Medicare HMO | Source: Ambulatory Visit | Attending: General Surgery | Admitting: General Surgery

## 2015-04-10 ENCOUNTER — Encounter (HOSPITAL_COMMUNITY): Payer: Self-pay

## 2015-04-10 DIAGNOSIS — Z803 Family history of malignant neoplasm of breast: Secondary | ICD-10-CM | POA: Diagnosis not present

## 2015-04-10 DIAGNOSIS — Z853 Personal history of malignant neoplasm of breast: Secondary | ICD-10-CM | POA: Diagnosis not present

## 2015-04-10 DIAGNOSIS — K869 Disease of pancreas, unspecified: Secondary | ICD-10-CM | POA: Diagnosis present

## 2015-04-10 DIAGNOSIS — C786 Secondary malignant neoplasm of retroperitoneum and peritoneum: Secondary | ICD-10-CM | POA: Diagnosis not present

## 2015-04-10 DIAGNOSIS — Z7982 Long term (current) use of aspirin: Secondary | ICD-10-CM | POA: Diagnosis not present

## 2015-04-10 DIAGNOSIS — R000 Tachycardia, unspecified: Secondary | ICD-10-CM | POA: Diagnosis not present

## 2015-04-10 MED ORDER — CEFAZOLIN SODIUM-DEXTROSE 2-3 GM-% IV SOLR
2.0000 g | INTRAVENOUS | Status: AC
  Administered 2015-04-11: 2 g via INTRAVENOUS
  Filled 2015-04-10: qty 50

## 2015-04-10 NOTE — Progress Notes (Addendum)
Ms Tammy Schmitt has history CABG in 2001.  Patient denies chest pain or shortness of breath.  Dr Tammy Schmitt is her cardiologist, she saw him in December 2016. Patient stopped Lorsartan with Dr Tammy Schmitt permission.  Patients blood pressure continues to run Systolic less than [id number].

## 2015-04-11 ENCOUNTER — Observation Stay (HOSPITAL_COMMUNITY)
Admission: RE | Admit: 2015-04-11 | Discharge: 2015-04-14 | Disposition: A | Discharge status: Home / Self Care | Payer: Medicare HMO | Source: Ambulatory Visit | Attending: General Surgery | Admitting: General Surgery

## 2015-04-11 ENCOUNTER — Encounter (HOSPITAL_COMMUNITY)
Admission: RE | Disposition: A | Discharge status: Home / Self Care | Payer: Self-pay | Source: Ambulatory Visit | Attending: General Surgery

## 2015-04-11 ENCOUNTER — Inpatient Hospital Stay (HOSPITAL_COMMUNITY): Payer: Medicare HMO | Admitting: Vascular Surgery

## 2015-04-11 ENCOUNTER — Encounter (HOSPITAL_COMMUNITY): Payer: Self-pay | Admitting: *Deleted

## 2015-04-11 DIAGNOSIS — Z803 Family history of malignant neoplasm of breast: Secondary | ICD-10-CM | POA: Diagnosis not present

## 2015-04-11 DIAGNOSIS — Z853 Personal history of malignant neoplasm of breast: Secondary | ICD-10-CM | POA: Diagnosis not present

## 2015-04-11 DIAGNOSIS — C786 Secondary malignant neoplasm of retroperitoneum and peritoneum: Secondary | ICD-10-CM | POA: Diagnosis not present

## 2015-04-11 DIAGNOSIS — R000 Tachycardia, unspecified: Secondary | ICD-10-CM | POA: Insufficient documentation

## 2015-04-11 DIAGNOSIS — Z7982 Long term (current) use of aspirin: Secondary | ICD-10-CM | POA: Insufficient documentation

## 2015-04-11 DIAGNOSIS — Z9889 Other specified postprocedural states: Secondary | ICD-10-CM

## 2015-04-11 DIAGNOSIS — R0902 Hypoxemia: Secondary | ICD-10-CM

## 2015-04-11 HISTORY — PX: LAPAROSCOPY: SHX197

## 2015-04-11 SURGERY — LAPAROSCOPY, DIAGNOSTIC
Anesthesia: General | Site: Abdomen

## 2015-04-11 MED ORDER — ACETAMINOPHEN 325 MG PO TABS
650.0000 mg | ORAL_TABLET | ORAL | Status: DC | PRN
  Administered 2015-04-12: 650 mg via ORAL
  Filled 2015-04-11: qty 2

## 2015-04-11 MED ORDER — ONDANSETRON HCL 4 MG/2ML IJ SOLN
INTRAMUSCULAR | Status: DC | PRN
  Administered 2015-04-11: 4 mg via INTRAVENOUS

## 2015-04-11 MED ORDER — LIDOCAINE HCL (CARDIAC) 20 MG/ML IV SOLN
INTRAVENOUS | Status: DC | PRN
  Administered 2015-04-11: 70 mg via INTRAVENOUS

## 2015-04-11 MED ORDER — PROPOFOL 10 MG/ML IV BOLUS
INTRAVENOUS | Status: AC
  Filled 2015-04-11: qty 20

## 2015-04-11 MED ORDER — ACETAMINOPHEN 650 MG RE SUPP: 650.0000 mg | RECTAL | Status: DC | PRN

## 2015-04-11 MED ORDER — MIDAZOLAM HCL 5 MG/5ML IJ SOLN
INTRAMUSCULAR | Status: DC | PRN
  Administered 2015-04-11: 2 mg via INTRAVENOUS

## 2015-04-11 MED ORDER — HYDROMORPHONE HCL 1 MG/ML IJ SOLN
INTRAMUSCULAR | Status: AC
  Administered 2015-04-11: 0.5 mg via INTRAVENOUS
  Filled 2015-04-11: qty 1

## 2015-04-11 MED ORDER — BUPIVACAINE-EPINEPHRINE (PF) 0.25% -1:200000 IJ SOLN
INTRAMUSCULAR | Status: AC
  Filled 2015-04-11: qty 30

## 2015-04-11 MED ORDER — FENTANYL CITRATE (PF) 100 MCG/2ML IJ SOLN
INTRAMUSCULAR | Status: DC | PRN
  Administered 2015-04-11: 150 ug via INTRAVENOUS
  Administered 2015-04-11 (×2): 50 ug via INTRAVENOUS
  Administered 2015-04-11: 100 ug via INTRAVENOUS
  Administered 2015-04-11: 50 ug via INTRAVENOUS
  Administered 2015-04-11: 100 ug via INTRAVENOUS

## 2015-04-11 MED ORDER — SODIUM CHLORIDE 0.9% FLUSH
3.0000 mL | Freq: Two times a day (BID) | INTRAVENOUS | Status: DC
  Administered 2015-04-13: 3 mL via INTRAVENOUS

## 2015-04-11 MED ORDER — PHENYLEPHRINE HCL 10 MG/ML IJ SOLN
INTRAMUSCULAR | Status: DC | PRN
  Administered 2015-04-11: 80 ug via INTRAVENOUS

## 2015-04-11 MED ORDER — LIDOCAINE HCL 1 % IJ SOLN
INTRAMUSCULAR | Status: DC | PRN
  Administered 2015-04-11: 7 mL via INTRAMUSCULAR

## 2015-04-11 MED ORDER — SODIUM CHLORIDE 0.9 % IV SOLN: 250.0000 mL | INTRAVENOUS | Status: DC | PRN

## 2015-04-11 MED ORDER — ONDANSETRON HCL 4 MG/2ML IJ SOLN
4.0000 mg | Freq: Once | INTRAMUSCULAR | Status: AC
  Administered 2015-04-11: 4 mg via INTRAVENOUS

## 2015-04-11 MED ORDER — FENTANYL CITRATE (PF) 250 MCG/5ML IJ SOLN
INTRAMUSCULAR | Status: AC
  Filled 2015-04-11: qty 5

## 2015-04-11 MED ORDER — OXYCODONE HCL 5 MG PO TABS
5.0000 mg | ORAL_TABLET | ORAL | Status: DC | PRN
  Administered 2015-04-12: 10 mg via ORAL
  Filled 2015-04-11: qty 2

## 2015-04-11 MED ORDER — MIDAZOLAM HCL 2 MG/2ML IJ SOLN
INTRAMUSCULAR | Status: AC
Start: 2015-04-11 — End: 2015-04-11
  Filled 2015-04-11: qty 2

## 2015-04-11 MED ORDER — LACTATED RINGERS IV SOLN
INTRAVENOUS | Status: DC
  Administered 2015-04-11 – 2015-04-12 (×3): via INTRAVENOUS

## 2015-04-11 MED ORDER — PROMETHAZINE HCL 25 MG/ML IJ SOLN: 6.2500 mg | INTRAMUSCULAR | Status: DC | PRN

## 2015-04-11 MED ORDER — SODIUM CHLORIDE 0.9 % IR SOLN
Status: DC | PRN
  Administered 2015-04-11: 1000 mL

## 2015-04-11 MED ORDER — DEXTROSE 5 % IV SOLN
10.0000 mg | INTRAVENOUS | Status: DC | PRN
  Administered 2015-04-11: 25 ug/min via INTRAVENOUS

## 2015-04-11 MED ORDER — LIDOCAINE HCL (PF) 1 % IJ SOLN
INTRAMUSCULAR | Status: AC
  Filled 2015-04-11: qty 30

## 2015-04-11 MED ORDER — ONDANSETRON HCL 4 MG/2ML IJ SOLN
INTRAMUSCULAR | Status: AC
  Administered 2015-04-11: 4 mg via INTRAVENOUS
  Filled 2015-04-11: qty 2

## 2015-04-11 MED ORDER — ONDANSETRON HCL 4 MG/2ML IJ SOLN
4.0000 mg | Freq: Four times a day (QID) | INTRAMUSCULAR | Status: DC | PRN
  Administered 2015-04-12 (×2): 4 mg via INTRAVENOUS
  Filled 2015-04-11 (×2): qty 2

## 2015-04-11 MED ORDER — EPHEDRINE SULFATE 50 MG/ML IJ SOLN
INTRAMUSCULAR | Status: DC | PRN
  Administered 2015-04-11: 20 mg via INTRAVENOUS

## 2015-04-11 MED ORDER — METOPROLOL TARTRATE 12.5 MG HALF TABLET
12.5000 mg | ORAL_TABLET | Freq: Once | ORAL | Status: AC
  Administered 2015-04-11: 12.5 mg via ORAL
  Filled 2015-04-11: qty 1

## 2015-04-11 MED ORDER — LACTATED RINGERS IV SOLN
INTRAVENOUS | Status: DC | PRN
  Administered 2015-04-11: 15:00:00 via INTRAVENOUS

## 2015-04-11 MED ORDER — SUGAMMADEX SODIUM 200 MG/2ML IV SOLN
INTRAVENOUS | Status: AC
  Filled 2015-04-11: qty 2

## 2015-04-11 MED ORDER — METOPROLOL TARTRATE 12.5 MG HALF TABLET
ORAL_TABLET | ORAL | Status: AC
  Filled 2015-04-11: qty 1

## 2015-04-11 MED ORDER — HYDROMORPHONE HCL 1 MG/ML IJ SOLN
0.2500 mg | INTRAMUSCULAR | Status: DC | PRN
  Administered 2015-04-11 (×4): 0.5 mg via INTRAVENOUS

## 2015-04-11 MED ORDER — FENTANYL CITRATE (PF) 250 MCG/5ML IJ SOLN
INTRAMUSCULAR | Status: AC
Start: 2015-04-11 — End: 2015-04-11
  Filled 2015-04-11: qty 5

## 2015-04-11 MED ORDER — PHENYLEPHRINE 40 MCG/ML (10ML) SYRINGE FOR IV PUSH (FOR BLOOD PRESSURE SUPPORT)
PREFILLED_SYRINGE | INTRAVENOUS | Status: AC
  Filled 2015-04-11: qty 10

## 2015-04-11 MED ORDER — ROCURONIUM BROMIDE 100 MG/10ML IV SOLN
INTRAVENOUS | Status: DC | PRN
  Administered 2015-04-11: 25 mg via INTRAVENOUS
  Administered 2015-04-11: 15 mg via INTRAVENOUS

## 2015-04-11 MED ORDER — SUGAMMADEX SODIUM 200 MG/2ML IV SOLN
INTRAVENOUS | Status: DC | PRN
  Administered 2015-04-11: 128.8 mg via INTRAVENOUS

## 2015-04-11 MED ORDER — PROPOFOL 10 MG/ML IV BOLUS
INTRAVENOUS | Status: DC | PRN
  Administered 2015-04-11: 120 mg via INTRAVENOUS

## 2015-04-11 MED ORDER — SODIUM CHLORIDE 0.9% FLUSH: 3.0000 mL | INTRAVENOUS | Status: DC | PRN

## 2015-04-11 SURGICAL SUPPLY — 46 items
BLADE SURG ROTATE 9660 (MISCELLANEOUS) IMPLANT
CANISTER SUCTION 2500CC (MISCELLANEOUS) ×3 IMPLANT
CHLORAPREP W/TINT 26ML (MISCELLANEOUS) ×3 IMPLANT
CONT SPEC 4OZ CLIKSEAL STRL BL (MISCELLANEOUS) ×9 IMPLANT
COVER SURGICAL LIGHT HANDLE (MISCELLANEOUS) ×3 IMPLANT
DECANTER SPIKE VIAL GLASS SM (MISCELLANEOUS) IMPLANT
DERMABOND ADVANCED (GAUZE/BANDAGES/DRESSINGS) ×2
DERMABOND ADVANCED .7 DNX12 (GAUZE/BANDAGES/DRESSINGS) ×1 IMPLANT
DRAPE LAPAROSCOPIC ABDOMINAL (DRAPES) ×3 IMPLANT
DRAPE WARM FLUID 44X44 (DRAPE) ×3 IMPLANT
DRSG TELFA 3X8 NADH (GAUZE/BANDAGES/DRESSINGS) ×3 IMPLANT
ELECT REM PT RETURN 9FT ADLT (ELECTROSURGICAL) ×3
ELECTRODE REM PT RTRN 9FT ADLT (ELECTROSURGICAL) ×1 IMPLANT
GLOVE BIO SURGEON STRL SZ 6 (GLOVE) ×3 IMPLANT
GLOVE BIO SURGEON STRL SZ7 (GLOVE) ×3 IMPLANT
GLOVE BIOGEL PI IND STRL 6.5 (GLOVE) ×1 IMPLANT
GLOVE BIOGEL PI IND STRL 7.0 (GLOVE) ×2 IMPLANT
GLOVE BIOGEL PI INDICATOR 6.5 (GLOVE) ×2
GLOVE BIOGEL PI INDICATOR 7.0 (GLOVE) ×4
GLOVE SURG SS PI 6.5 STRL IVOR (GLOVE) ×3 IMPLANT
GOWN STRL REUS W/ TWL LRG LVL3 (GOWN DISPOSABLE) ×3 IMPLANT
GOWN STRL REUS W/TWL 2XL LVL3 (GOWN DISPOSABLE) ×3 IMPLANT
GOWN STRL REUS W/TWL LRG LVL3 (GOWN DISPOSABLE) ×6
KIT BASIN OR (CUSTOM PROCEDURE TRAY) ×3 IMPLANT
KIT ROOM TURNOVER OR (KITS) ×3 IMPLANT
L-HOOK LAP DISP 36CM (ELECTROSURGICAL) ×3
LHOOK LAP DISP 36CM (ELECTROSURGICAL) ×1 IMPLANT
LIQUID BAND (GAUZE/BANDAGES/DRESSINGS) IMPLANT
NS IRRIG 1000ML POUR BTL (IV SOLUTION) ×3 IMPLANT
PAD ARMBOARD 7.5X6 YLW CONV (MISCELLANEOUS) ×6 IMPLANT
PENCIL BUTTON HOLSTER BLD 10FT (ELECTRODE) ×3 IMPLANT
POUCH SPECIMEN RETRIEVAL 10MM (ENDOMECHANICALS) ×6 IMPLANT
SCISSORS LAP 5X35 DISP (ENDOMECHANICALS) ×3 IMPLANT
SET IRRIG TUBING LAPAROSCOPIC (IRRIGATION / IRRIGATOR) ×3 IMPLANT
SHEARS HARMONIC ACE PLUS 36CM (ENDOMECHANICALS) ×3 IMPLANT
SLEEVE ENDOPATH XCEL 5M (ENDOMECHANICALS) ×9 IMPLANT
SUT MNCRL AB 4-0 PS2 18 (SUTURE) ×3 IMPLANT
TOWEL OR 17X24 6PK STRL BLUE (TOWEL DISPOSABLE) ×3 IMPLANT
TOWEL OR 17X26 10 PK STRL BLUE (TOWEL DISPOSABLE) ×3 IMPLANT
TRAP SPECIMEN MUCOUS 40CC (MISCELLANEOUS) ×3 IMPLANT
TRAY LAPAROSCOPIC MC (CUSTOM PROCEDURE TRAY) ×3 IMPLANT
TROCAR XCEL BLUNT TIP 100MML (ENDOMECHANICALS) IMPLANT
TROCAR XCEL NON-BLD 11X100MML (ENDOMECHANICALS) ×3 IMPLANT
TROCAR XCEL NON-BLD 5MMX100MML (ENDOMECHANICALS) ×3 IMPLANT
TUBING INSUFFLATION (TUBING) ×3 IMPLANT
WATER STERILE IRR 1000ML POUR (IV SOLUTION) IMPLANT

## 2015-04-11 NOTE — Anesthesia Procedure Notes (Signed)
Procedure Name: Intubation Date/Time: 04/11/2015 4:00 PM Performed by: Manus Gunning, Oluwatomisin Hustead J Pre-anesthesia Checklist: Patient identified, Timeout performed, Emergency Drugs available, Suction available and Patient being monitored Patient Re-evaluated:Patient Re-evaluated prior to inductionOxygen Delivery Method: Circle system utilized Preoxygenation: Pre-oxygenation with 100% oxygen Intubation Type: IV induction Ventilation: Mask ventilation without difficulty Laryngoscope Size: Mac and 3 Grade View: Grade I Tube type: Oral Tube size: 7.0 mm Number of attempts: 1 Placement Confirmation: ETT inserted through vocal cords under direct vision,  breath sounds checked- equal and bilateral,  positive ETCO2 and CO2 detector Secured at: 19 cm Tube secured with: Tape Dental Injury: Teeth and Oropharynx as per pre-operative assessment

## 2015-04-11 NOTE — H&P (Signed)
Tammy Schmitt 04/06/2015 11:26 AM Location: [facility] Patient #: [id number] DOB: Nov 20, 1940 Divorced / Language: Tammy Schmitt / Race: White Female   History of Present Illness Stark Klein MD; 04/06/2015 1:08 PM) The patient is a 75 year old female who presents with a pancreatic mass. Patient is a 75 year old female who presented with a 2-3 month history of abdominal and back pain in December 2016. She was found to have a mass in the body of the pancreas that appeared to encase the superior mesenteric artery. She also was seen to have an ovarian mass and peritoneal implants. She has had multiple CT-guided biopsies and 2 endoscopic ultrasounds which have not yielded a diagnosis. She is actually doing much better now that she started on Creon. She had been having anorexia and weight loss. She also felt like every time she ate that the food one straight through her. This is a 30 mm difference just being on it for 1 day. She is seeing nutrition. She denies jaundice. She denies history of pancreatitis.  She has a personal history of breast cancer as well as having a mother with breast cancer. She had a son with leukemia and a daughter that had esophageal cancer at age 26.   CA 19-9 953. CA 125 481.7  PET 03/29/2015  IMPRESSION: 1. Hypermetabolism corresponding to ill-defined soft tissue mass arising either from the uncinate process of the pancreas or the transverse duodenum. This is consistent with primary malignancy. 2. Moderate to marked progression of widespread omental/peritoneal metastasis. Development of moderate volume ascites. Nodal metastasis within the small bowel mesentery. If successful tissue sampling has not been performed, laparoscopy may be necessary (presuming endoscopic ultrasound of the presumed primary is not possible). 3. Low-level hypermetabolism in the left ovary is favored to represent a a synchronous indolent and likely benign neoplasm,  given the biopsy results suggesting a fibroma. 4. Bilateral lung nodules. Although technically indeterminate, suspicious for pulmonary metastasis. 5. Trace right pleural fluid. 6. Hiatal hernia.  CT Abd/pelvis 02/09/2015 IMPRESSION: 1. 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 There is a suspicious 6 mm lesion in segment 7 of the liver, potentially metastatic. In addition, there appears to be widespread intraperitoneal metastasis and a trace volume of ascites which may be malignant ascites. Correlation with EGD for further evaluation and biopsy is recommended at this time.    Other Problems Tammy Schmitt, CMA; 04/06/2015 11:26 AM) Back Pain Gastroesophageal Reflux Disease High blood pressure Hypercholesterolemia Myocardial infarction  Past Surgical History Tammy Schmitt, CMA; 04/06/2015 11:26 AM) Breast Biopsy Left. Breast Mass; Local Excision Left. Oral Surgery  Diagnostic Studies History Tammy Schmitt, CMA; 04/06/2015 11:26 AM) Colonoscopy 1-5 years ago Mammogram 1-3 years ago  Allergies Tammy Schmitt, CMA; 04/06/2015 11:28 AM) Statins ACE Inhibitors Codeine Phosphate *ANALGESICS - OPIOID* Levaquin *FLUOROQUINOLONES*  Medication History (Tammy Schmitt, CMA; 04/06/2015 11:30 AM) HYDROmorphone HCl (2MG  Tablet, Oral) Active. Creon (24000UNIT Capsule DR Part, Oral) Active. Esomeprazole Magnesium (40MG  Capsule DR, Oral) Active. Losartan Potassium (50MG  Tablet, Oral) Active. Metoprolol Tartrate (25MG  Tablet, Oral) Active. Omeprazole (40MG  Capsule DR, Oral)  Active. Zetia (10MG  Tablet, Oral) Active. Aspirin (81MG  Tablet Chewable, Oral) Active. Tylenol (500MG  Capsule, Oral) Active. Nitrostat (0.4MG   Tab Sublingual, Sublingual) Active. Medications Reconciled  Social History Tammy Schmitt, CMA; 04/06/2015 11:26 AM) Alcohol use Recently quit alcohol use. Caffeine use Carbonated beverages, Coffee. No drug use Tobacco use Former smoker.  Family History Tammy Schmitt, [place]; 04/06/2015 11:26 AM) Alcohol Abuse Family Members In General. Breast Cancer Mother. Cancer Daughter, Son. Heart Disease Brother, Father. Heart disease in female family member before age 60 Hypertension Father.  Pregnancy / Birth History Tammy Schmitt, [name]; 04/06/2015 11:26 AM) Age at menarche 23 years. Age of menopause 48-60 Gravida 3 Maternal age 78-30 Para 3    Review of Systems ([place]; 04/06/2015 11:26 AM) General Present- Weight Loss. Not Present- Appetite Loss, Chills, Fatigue, Fever, Night Sweats and Weight Gain. Skin Not Present- Change in Wart/Mole, Dryness, Hives, Jaundice, New Lesions, Non-Healing Wounds, Rash and Ulcer. HEENT Not Present- Earache, Hearing Loss, Hoarseness, Nose Bleed, Oral Ulcers, Ringing in the Ears, Seasonal Allergies, Sinus Pain, Sore Throat, Visual Disturbances, Wears glasses/contact lenses and Yellow Eyes. Respiratory Not Present- Bloody sputum, Chronic Cough, Difficulty Breathing, Snoring and Wheezing. Breast Not Present- Breast Mass, Breast Pain, Nipple Discharge and Skin Changes. Cardiovascular Not Present- Chest Pain, Difficulty Breathing Lying Down, Leg Cramps, Palpitations, Rapid Heart Rate, Shortness of Breath and Swelling of Extremities. Gastrointestinal Present- Abdominal Pain, Change in Bowel Habits and Nausea. Not Present- Bloating, Bloody Stool, Chronic diarrhea, Constipation, Difficulty Swallowing, Excessive gas, Gets full quickly at meals, Hemorrhoids, Indigestion, Rectal Pain and Vomiting. Female  Genitourinary Not Present- Frequency, Nocturia, Painful Urination, Pelvic Pain and Urgency. Musculoskeletal Present- Back Pain. Not Present- Joint Pain, Joint Stiffness, Muscle Pain, Muscle Weakness and Swelling of Extremities. Neurological Not Present- Decreased Memory, Fainting, Headaches, Numbness, Seizures, Tingling, Tremor, Trouble walking and Weakness. Psychiatric Not Present- Anxiety, Bipolar, Change in Sleep Pattern, Depression, Fearful and Frequent crying. Endocrine Not Present- Cold Intolerance, Excessive Hunger, Hair Changes, Heat Intolerance, Hot flashes and New Diabetes. Hematology Not Present- Easy Bruising, Excessive bleeding, Gland problems, HIV and Persistent Infections.  Vitals (Tammy Schmitt CMA; 04/06/2015 11:27 AM) 04/06/2015 11:26 AM Weight: 145 lb Height: 58in Body Surface Area: 1.59 m Body Mass Index: 30.3 kg/m  Temp.: 41F(Temporal)  Pulse: 76 (Regular)  BP: 128/80 (Sitting, Left Arm, Standard)       Physical Exam Stark Klein MD; 04/06/2015 1:08 PM) General Mental Status-Alert. General Appearance-Consistent with stated age. Hydration-Well hydrated. Voice-Normal.  Head and Neck Head-normocephalic, atraumatic with no lesions or palpable masses. Trachea-midline. Thyroid Gland Characteristics - normal size and consistency.  Eye Eyeball - Bilateral-Extraocular movements intact. Sclera/Conjunctiva - Bilateral-No scleral icterus.  Chest and Lung Exam Chest and lung exam reveals -quiet, even and easy respiratory effort with no use of accessory muscles and on auscultation, normal breath sounds, no adventitious sounds and normal vocal resonance. Inspection Chest Wall - Normal. Back - normal.  Cardiovascular Cardiovascular examination reveals -normal heart sounds, regular rate and rhythm with no murmurs and normal pedal pulses bilaterally.  Abdomen Inspection Inspection of the abdomen reveals - No  Hernias. Palpation/Percussion Palpation and Percussion of the abdomen reveal - Soft, Non Tender, No Rebound tenderness, No Rigidity (guarding) and No hepatosplenomegaly. Auscultation Auscultation of the abdomen reveals - Bowel sounds normal.  Neurologic Neurologic evaluation reveals -alert and oriented x 3 with no impairment of recent or remote memory. Mental Status-Normal.  Musculoskeletal Global Assessment -Note: no gross deformities.  Normal Exam - Left-Upper Extremity Strength Normal and Lower Extremity Strength Normal. Normal Exam - Right-Upper Extremity Strength Normal and Lower Extremity Strength Normal.  Lymphatic Head & Neck  General Head & Neck Lymphatics: Bilateral - Description - Normal. Axillary  General Axillary Region: Bilateral - Description - Normal. Tenderness - Non Tender. Femoral & Inguinal  Generalized Femoral & Inguinal Lymphatics: Bilateral - Description - No Generalized lymphadenopathy.    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 Rather than subject her to additional attempts at getting his biopsies, we'll take her to the operating room for a diagnostic laparoscopy. She has a small amount of ascites that I will also collected for cytology. I will try to take several large implants and send them for frozen section in order to decrease the risk of having more negative biopsies.  I reviewed the risks of surgery including bleeding, infection, damage to adjacent structures, possible need for additional surgeries or procedures, possible heart or lung complications, possible blood clots, possible wound issues. Current Plans You are being scheduled for surgery - Our schedulers will call you.  You should hear from our office's scheduling department within 5 working days about the location, date, and time of  surgery. We try to make accommodations for patient's preferences in scheduling surgery, but sometimes the OR schedule or the surgeon's schedule prevents [place] from making those accommodations.  If you have not heard from our office [phone]) in 5 working days, call the office and ask for your surgeon's nurse.  If you have other questions about your diagnosis, plan, or surgery, call the office and ask for your surgeon's nurse.  Pt Education - [facility] Free Text Education/Instructions: discussed with patient and provided information.

## 2015-04-11 NOTE — Transfer of Care (Signed)
Immediate Anesthesia Transfer of Care Note  Patient: Tammy Schmitt  Procedure(s) Performed: Procedure(s): LAPAROSCOPY DIAGNOSTIC (N/A)  Patient Location: PACU  Anesthesia Type:General  Level of Consciousness: awake  Airway & Oxygen Therapy: Patient Spontanous Breathing and Patient connected to nasal cannula oxygen  Post-op Assessment: Report given to RN and Post -op Vital signs reviewed and stable  Post vital signs: Reviewed and stable  Last Vitals:  Filed Vitals:   04/11/15 1154 04/11/15 1713  BP: 154/55   Pulse: 99   Temp: 36.3 C 36.8 C  Resp: 18     Complications: No apparent anesthesia complications

## 2015-04-11 NOTE — Op Note (Signed)
PRE-OPERATIVE DIAGNOSIS: pancreatic mass, peritoneal implants  POST-OPERATIVE DIAGNOSIS:  Same  PROCEDURE:  Procedure(s): Diagnostic laparoscopy, peritoneal washings, peritoneal biopsies, partial omentectomy  SURGEON:  Surgeon(s): Stark Klein, MD  ANESTHESIA:   local and general  DRAINS: none   LOCAL MEDICATIONS USED:  BUPIVICAINE  and LIDOCAINE   SPECIMEN:  Source of Specimen:  peritoneal nodules on diaphragm, falciform, pelvic peritoneum.  Portion of omentum.  [place]ashings for cytology.    DISPOSITION OF SPECIMEN:  PATHOLOGY  COUNTS:  YES  DICTATION: .Dragon Dictation  PLAN OF CARE: Discharge to home after PACU  PATIENT DISPOSITION:  PACU - hemodynamically stable.  FINDINGS:  >50 peritoneal nodules scattered throughout abdomen.  Firm omentum throughout.  Few liver nodules.  Ascites around 1200 mL.    EBL: min, 1200 mL ascites.    PROCEDURE:   Pt was identified in the holding area and taken to the OR where she was placed supine on the OR table.  General anesthesia was induced.  The patient's abdomen was prepped and draped in sterile fashion.  A time out was performed according to the surgical safety checklist.  When all was correct, we continued.    A 5 mm Optiview port was placed in the LUQ.  Nodules were immediately seen on the peritoneum in all quadrants.  Two additional ports were placed in the left abdomen.  The biopsy forceps were used to take multiple nodules along the falciform and diaphragm.  While these were sent for frozen section, the ascites from the upper abdomen was taken and collected in a sputum trap for cytology.  A portion of the firm omentum was taken with the harmonic scalpel.  The LUQ port was upsized to a 12 mm port in order to remove the omental specimen in an endocatch bag.  It was too large to come out the 5 mm trocar.  Also, some of the nodules of the peritoneum on the lower anterior abdominal wall were taken sharply with the scissors. Once frozen  section confirmed adenocarcinoma, I inspected for hemostasis.  No bleeding or succus was seen.  The pneumoperitoneum was allowed to evacuate.  The skin of all the incisions was reapproximated with 4-0 monocryl in subcuticular fashion.    The skin was reapproximated with 4-0 monocryl in subcuticular fashion.  The wounds were cleaned, dried, and dressed with dermabond.  The patient was taken to the PACU in stable condition.  Needle, sponge, and instrument counts were correct x 2.

## 2015-04-11 NOTE — OR Nursing (Signed)
Bladder scan performed and after multiple scans estimated amount was 150cc.  Pt was I&O cathed and 600 cc of concentrated bloody urine was produced.

## 2015-04-11 NOTE — OR Nursing (Signed)
Ms. Blash states like she needs to use the bathroom, has about 1500 cc in total (IV + PO) and has attempted to urinate multiple times with no success.  Dr. Hulen Skains notified. Order received for a Bladder scan.

## 2015-04-11 NOTE — Anesthesia Postprocedure Evaluation (Signed)
Anesthesia Post Note  Patient: Tammy Schmitt  Procedure(s) Performed: Procedure(s) (LRB): LAPAROSCOPY DIAGNOSTIC (N/A)  Anesthesia Post Evaluation  Last Vitals:  Filed Vitals:   04/11/15 1154 04/11/15 1713  BP: 154/55   Pulse: 99   Temp: 36.3 C 36.8 C  Resp: 18     Last Pain:  Filed Vitals:   04/11/15 1715  PainSc: 8                  Sanaiyah Kirchhoff

## 2015-04-11 NOTE — Interval H&P Note (Signed)
History and Physical Interval Note:  04/11/2015 3:17 PM  Tammy Schmitt  has presented today for surgery, with the diagnosis of PANCREATIC MASS  The various methods of treatment have been discussed with the patient and family. After consideration of risks, benefits and other options for treatment, the patient has consented to  Procedure(s): LAPAROSCOPY DIAGNOSTIC (N/A) as a surgical intervention .  The patient's history has been reviewed, patient examined, no change in status, stable for surgery.  I have reviewed the patient's chart and labs.  Questions were answered to the patient's satisfaction.     [name]

## 2015-04-11 NOTE — Anesthesia Preprocedure Evaluation (Addendum)
Anesthesia Evaluation  Patient identified by MRN, date of birth, ID band Patient awake    Reviewed: Allergy & Precautions, NPO status , Patient's Chart, lab work & pertinent test results  History of Anesthesia Complications (+) Family history of anesthesia reaction  Airway Mallampati: II  TM Distance: >3 FB Neck ROM: Full    Dental no notable dental hx.    Pulmonary COPD, former smoker,    Pulmonary exam normal breath sounds clear to auscultation       Cardiovascular hypertension, Pt. on medications and Pt. 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   Neuro/Psych negative neurological ROS  negative psych ROS   GI/Hepatic Neg liver ROS, GERD  Medicated,  Endo/Other  negative endocrine ROS  Renal/GU negative Renal ROS  negative genitourinary   Musculoskeletal  (+) Arthritis ,   Abdominal   Peds negative pediatric ROS (+)  Hematology negative hematology ROS (+)   Anesthesia Other Findings   Reproductive/Obstetrics negative OB ROS                            Anesthesia Physical Anesthesia Plan  ASA: III  Anesthesia Plan: General   Post-op Pain Management:    Induction: Intravenous  Airway Management Planned: Oral ETT  Additional Equipment:   Intra-op Plan:   Post-operative Plan: Extubation in OR  Informed Consent: I have reviewed the patients History and Physical, chart, labs and discussed the procedure including the risks, benefits and alternatives for the proposed anesthesia with the patient or authorized representative who has indicated his/her understanding and acceptance.   Dental advisory given  Plan Discussed with: CRNA  Anesthesia Plan Comments:         Anesthesia Quick Evaluation

## 2015-04-11 NOTE — Discharge Instructions (Signed)
[facility] Office Phone Number [phone]   POST OP INSTRUCTIONS  Always review your discharge instruction sheet given to you by the facility where your surgery was performed.  IF YOU HAVE DISABILITY OR FAMILY LEAVE FORMS, YOU MUST BRING THEM TO THE OFFICE FOR PROCESSING.  DO NOT GIVE THEM TO YOUR DOCTOR.  1. A prescription for pain medication may be given to you upon discharge.  Take your pain medication as prescribed, if needed.  If narcotic pain medicine is not needed, then you may take acetaminophen (Tylenol) or ibuprofen (Advil) as needed. 2. Take your usually prescribed medications unless otherwise directed 3. If you need a refill on your pain medication, please contact your pharmacy.  They will contact our office to request authorization.  Prescriptions will not be filled after 5pm or on week-ends. 4. You should eat very light the first 24 hours after surgery, such as soup, crackers, pudding, etc.  Resume your normal diet the day after surgery 5. It is common to experience some constipation if taking pain medication after surgery.  Increasing fluid intake and taking a stool softener will usually help or prevent this problem from occurring.  A mild laxative (Milk of Magnesia or Miralax) should be taken according to package directions if there are no bowel movements after 48 hours. 6. You may shower in 48 hours.  The surgical glue will flake off in 2-3 weeks.   7. ACTIVITIES:  No strenuous activity or heavy lifting for 1 week.   a. You may drive when you no longer are taking prescription pain medication, you can comfortably wear a seatbelt, and you can safely maneuver your car and apply brakes. b. RETURN TO WORK:  __________n/a_______________ You should see your doctor in the office for a follow-up appointment approximately three-four weeks after your surgery.    WHEN TO CALL YOUR DOCTOR: 1. Fever over 101.0 2. Nausea and/or vomiting. 3. Extreme swelling or  bruising. 4. Continued bleeding from incision. 5. Increased pain, redness, or drainage from the incision.  The clinic staff is available to answer your questions during regular business hours.  Please don't hesitate to call and ask to speak to one of the nurses for clinical concerns.  If you have a medical emergency, go to the nearest emergency room or call 911.  A surgeon from [facility] is always on call at the hospital.  For further questions, please visit [email or web address]      

## 2015-04-11 NOTE — OR Nursing (Signed)
Tammy Schmitt stated she would prefer to stay the night to have someone care for her.  Dr. Hulen Skains notified and orders received.

## 2015-04-11 NOTE — Anesthesia Postprocedure Evaluation (Signed)
Anesthesia Post Note  Patient: Tammy Schmitt  Procedure(s) Performed: Procedure(s) (LRB): LAPAROSCOPY DIAGNOSTIC (N/A)  Patient location during evaluation: PACU Anesthesia Type: General Level of consciousness: awake and alert Pain management: pain level controlled Vital Signs Assessment: post-procedure vital signs reviewed and stable Respiratory status: spontaneous breathing, nonlabored ventilation, respiratory function stable and patient connected to nasal cannula oxygen Cardiovascular status: blood pressure returned to baseline and stable Postop Assessment: no signs of nausea or vomiting Anesthetic complications: no    Last Vitals:  Filed Vitals:   04/11/15 1829 04/11/15 1830  BP: 99/58   Pulse: 110 112  Temp:    Resp: 21 13    Last Pain:  Filed Vitals:   04/11/15 1836  PainSc: 4                  Rynn Markiewicz J

## 2015-04-12 ENCOUNTER — Encounter (HOSPITAL_COMMUNITY): Payer: Self-pay | Admitting: General Surgery

## 2015-04-12 ENCOUNTER — Observation Stay (HOSPITAL_COMMUNITY): Payer: Medicare HMO

## 2015-04-12 ENCOUNTER — Telehealth: Payer: Self-pay | Admitting: *Deleted

## 2015-04-12 ENCOUNTER — Encounter: Payer: Self-pay | Admitting: *Deleted

## 2015-04-12 DIAGNOSIS — C786 Secondary malignant neoplasm of retroperitoneum and peritoneum: Secondary | ICD-10-CM | POA: Diagnosis not present

## 2015-04-12 LAB — CBC
HEMATOCRIT: 31.5 % — AB (ref 36.0–46.0)
HEMOGLOBIN: 9.7 g/dL — AB (ref 12.0–15.0)
MCH: 25.3 pg — AB (ref 26.0–34.0)
MCHC: 30.8 g/dL (ref 30.0–36.0)
MCV: 82.2 fL (ref 78.0–100.0)
Platelets: 351 10*3/uL (ref 150–400)
RBC: 3.83 MIL/uL — AB (ref 3.87–5.11)
RDW: 13.7 % (ref 11.5–15.5)
WBC: 17.6 10*3/uL — ABNORMAL HIGH (ref 4.0–10.5)

## 2015-04-12 MED ORDER — HYDROMORPHONE HCL 2 MG PO TABS
2.0000 mg | ORAL_TABLET | ORAL | Status: DC | PRN
  Administered 2015-04-13 – 2015-04-14 (×6): 2 mg via ORAL
  Filled 2015-04-12 (×6): qty 1

## 2015-04-12 MED ORDER — ENSURE ENLIVE PO LIQD
237.0000 mL | Freq: Three times a day (TID) | ORAL | Status: DC
  Administered 2015-04-13 (×2): 237 mL via ORAL

## 2015-04-12 MED ORDER — KCL IN DEXTROSE-NACL 20-5-0.45 MEQ/L-%-% IV SOLN
INTRAVENOUS | Status: DC
  Administered 2015-04-12 (×2): via INTRAVENOUS
  Filled 2015-04-12 (×3): qty 1000

## 2015-04-12 MED ORDER — HYDROMORPHONE HCL 1 MG/ML IJ SOLN
0.5000 mg | INTRAMUSCULAR | Status: DC | PRN
  Administered 2015-04-12 (×3): 1 mg via INTRAVENOUS
  Administered 2015-04-12: 2 mg via INTRAVENOUS
  Administered 2015-04-13: 1 mg via INTRAVENOUS
  Filled 2015-04-12: qty 1
  Filled 2015-04-12: qty 2
  Filled 2015-04-12 (×3): qty 1

## 2015-04-12 NOTE — Progress Notes (Signed)
Initial Nutrition Assessment  DOCUMENTATION CODES:   Obesity unspecified  INTERVENTION:   Ensure Enlive po TID, each supplement provides 350 kcal and 20 grams of protein  NUTRITION DIAGNOSIS:   Inadequate oral intake related to poor appetite, nausea as evidenced by meal completion < 25%.  GOAL:   Patient will meet greater than or equal to 90% of their needs  MONITOR:   PO intake, Supplement acceptance, Labs, Weight trends, Skin, I & O's  REASON FOR ASSESSMENT:   Malnutrition Screening Tool    ASSESSMENT:   The patient is a 75 year old female who presents with a pancreatic mass. Patient is a 75 year old female who presented with a 2-3 month history of abdominal and back pain in December 2016. She was found to have a mass in the body of the pancreas that appeared to encase the superior mesenteric artery. She also was seen to have an ovarian mass and peritoneal implants. She has had multiple CT-guided biopsies and 2 endoscopic ultrasounds which have not yielded a diagnosis. She is actually doing much better now that she started on Creon. She had been having anorexia and weight loss. She also felt like every time she ate that the food one straight through her. This is a 30 mm difference just being on it for 1 day. She is seeing nutrition. She denies jaundice. She denies history of pancreatitis.  Pt admitted with pancreatic mass.   Procedure(s) 04/11/15: Diagnostic laparoscopy, peritoneal washings, peritoneal biopsies, partial omentectomy  Hx obtained from pt and pt sister-in-law at bedside. Pt endorses a general decline in health over the past 9 months, when she moved from [place] to [place] in May 2016. She reveals her UBW is around 155# and estimates a 10# weight loss over the past 3 months. Noted wt stability over the past month.   Pt reports her appetite has been "horrible" over at least the past few weeks. She confirms that she saw RD at [facility]  on 04/06/15. She found the visit helpful, however, disclosed that it has been difficult to optimize protein intake secondary to nausea and poor appetite. She ate very poorly at breakfast and tells this RD that she will likely not consume her lunch due to poor appetite. She reports that she was considering trying nutritional supplements, however, had no had the opportunity to purchase any yet. She indicated that she enjoyed the Ensure Enlive samples (vanilla flavor). RD to order. Discussed with pt importance of consuming food and supplements to optimize nutritional intake and encouraged to pt to continue Ensure supplements after discharge.   Nutrition-Focused physical exam completed. Findings are mild fat depletion, mild muscle depletion, and no edema.   Reviewed surgical service notes. Pt is not stable for d/c today and awaiting pathology.   Case discussed with RN.   Labs reviewed: K: 3.4 (on IV replacement).   Diet Order:  Diet Heart Room service appropriate?: Yes; Fluid consistency:: Thin  Skin:  Reviewed, no issues  Last BM:  PTA  Height:   Ht Readings from Last 1 Encounters:  04/11/15 4\' 10"  (1.473 m)    Weight:   Wt Readings from Last 1 Encounters:  04/11/15 143 lb 8.3 oz (65.1 kg)    Ideal Body Weight:  44.1 kg  BMI:  Body mass index is 30 kg/(m^2).  Estimated Nutritional Needs:   Kcal:  1500-1700  Protein:  75-90 grams  Fluid:  1.5-1.7 L  EDUCATION NEEDS:   Education needs addressed  Silvino Selman A.  Jimmye Norman, RD, LDN, CDE Pager: [phone] After hours Pager: [phone]

## 2015-04-12 NOTE — Progress Notes (Signed)
1 Day Post-Op  Subjective: Having hiccups this AM and nausea.  Also mix up in pain medication occurred.  Pt on oral dilaudid at home for severe pain from pancreatic mass.    Objective: Vital signs in last 24 hours: Temp:  [97.3 F (36.3 C)-100.6 F (38.1 C)] 100.6 F (38.1 C) (02/16 0659) Pulse Rate:  [99-123] 120 (02/16 0700) Resp:  [8-21] 17 (02/16 0659) BP: (99-154)/(48-58) 120/48 mmHg (02/16 0659) SpO2:  [83 %-99 %] 92 % (02/16 0700) Weight:  [64.411 kg (142 lb)-65.1 kg (143 lb 8.3 oz)] 65.1 kg (143 lb 8.3 oz) (02/15 2306)    Intake/Output from previous day: 02/15 0701 - 02/16 0700 In: 1560 [P.O.:360; I.V.:1200] Out: 1010 [Urine:1000; Blood:10] Intake/Output this shift:    General appearance: alert, cooperative and mild distress Resp: breathing comfortably Cardio: regular rate and rhythm GI: soft, non distended.  tender at incisions only.  Lab Results:  No results for input(s): WBC, HGB, HCT, PLT in the last 72 hours. BMET No results for input(s): NA, K, CL, CO2, GLUCOSE, BUN, CREATININE, CALCIUM in the last 72 hours. PT/INR No results for input(s): LABPROT, INR in the last 72 hours. ABG No results for input(s): PHART, HCO3 in the last 72 hours.  Invalid input(s): PCO2, PO2  Studies/Results: No results found.  Anti-infectives: Anti-infectives    Start     Dose/Rate Route Frequency Ordered Stop   04/11/15 1330  ceFAZolin (ANCEF) IVPB 2 g/50 mL premix     2 g 100 mL/hr over 30 Minutes Intravenous To ShortStay Surgical 04/10/15 1418 04/11/15 1552      Assessment/Plan: s/p Procedure(s): LAPAROSCOPY DIAGNOSTIC (N/A) adjusted pain medication  Diet as tolerated, but currently nauseated.   Await final pathology NOT ready for d/c.  Check labs today and in AM.       [facility] 04/12/2015

## 2015-04-12 NOTE — Telephone Encounter (Signed)
  Oncology Nurse Navigator Documentation  Navigator Location: [facility] (04/12/15 1718) Navigator Encounter Type: Telephone (04/12/15 1718) Telephone: Appt Confirmation/Clarification;Outgoing Call (04/12/15 1718): Message from Dr. Barry Dienes that patient needs to see Dr. Irene Limbo early next week. Scheduled for 04/17/15 at 1:30. Left this on patient's home voice mail. As she is still in hospital, it should also print on her discharge papers.

## 2015-04-12 NOTE — Progress Notes (Addendum)
Patient tachycardic this am with slight low grade temperature, o2 saturation was in the low 80's, slightly anxious encourage to do deep breathing exercises and intructed to do inspirometer pulled 1000. O2 saturation up to 92%. Offered pain medication but patient refused because she prefers the pain med she normally takes at home dilaudid po. Page and called the answering service for Dr.  Barry Dienes and awaiting for response. Will follow up with the morning RN.

## 2015-04-12 NOTE — Progress Notes (Signed)
[place] Work  Clinical Social Work was referred by patient for assistance with ADRs.  Clinical Social Worker attempted to contact pt and left message, however, pt appears admitted at [facility] can follow up at future [facility] appointments.   Clinical Social Work interventions: Attempt to assist with ADRs.   Loren Racer, [place] Worker [place]  [place] Phone: [phone] Fax: [phone]

## 2015-04-12 NOTE — Progress Notes (Signed)
Dr. Barry Dienes notified of temp 101.1 and O2 desat to 86% on room air Orders received

## 2015-04-13 DIAGNOSIS — C786 Secondary malignant neoplasm of retroperitoneum and peritoneum: Secondary | ICD-10-CM | POA: Diagnosis not present

## 2015-04-13 LAB — BASIC METABOLIC PANEL
Anion gap: 9 (ref 5–15)
BUN: 8 mg/dL (ref 6–20)
CO2: 24 mmol/L (ref 22–32)
CREATININE: 0.53 mg/dL (ref 0.44–1.00)
Calcium: 8.2 mg/dL — ABNORMAL LOW (ref 8.9–10.3)
Chloride: 98 mmol/L — ABNORMAL LOW (ref 101–111)
GFR calc Af Amer: >60 mL/min (ref >60)
GLUCOSE: 209 mg/dL — AB (ref 65–99)
POTASSIUM: 4.4 mmol/L (ref 3.5–5.1)
SODIUM: 131 mmol/L — AB (ref 135–145)

## 2015-04-13 LAB — CBC
HCT: 27.9 % — ABNORMAL LOW (ref 36.0–46.0)
Hemoglobin: 9.2 g/dL — ABNORMAL LOW (ref 12.0–15.0)
MCH: 26.8 pg (ref 26.0–34.0)
MCHC: 33 g/dL (ref 30.0–36.0)
MCV: 81.3 fL (ref 78.0–100.0)
PLATELETS: 290 10*3/uL (ref 150–400)
RBC: 3.43 MIL/uL — AB (ref 3.87–5.11)
RDW: 13.8 % (ref 11.5–15.5)
WBC: 16.9 10*3/uL — ABNORMAL HIGH (ref 4.0–10.5)

## 2015-04-13 LAB — URINALYSIS, ROUTINE W REFLEX MICROSCOPIC
BILIRUBIN URINE: NEGATIVE
GLUCOSE, UA: NEGATIVE mg/dL
Ketones, ur: NEGATIVE mg/dL
Nitrite: NEGATIVE
PROTEIN: 30 mg/dL — AB
SPECIFIC GRAVITY, URINE: 1.009 (ref 1.005–1.030)
pH: 6 (ref 5.0–8.0)

## 2015-04-13 LAB — URINE MICROSCOPIC-ADD ON

## 2015-04-13 MED ORDER — PANTOPRAZOLE SODIUM 40 MG PO TBEC
40.0000 mg | DELAYED_RELEASE_TABLET | Freq: Every day | ORAL | Status: DC
  Administered 2015-04-13 – 2015-04-14 (×2): 40 mg via ORAL
  Filled 2015-04-13 (×2): qty 1

## 2015-04-13 MED ORDER — IBUPROFEN 400 MG PO TABS: 400.0000 mg | ORAL_TABLET | Freq: Every day | ORAL | Status: DC | PRN

## 2015-04-13 MED ORDER — BUDESONIDE-FORMOTEROL FUMARATE 160-4.5 MCG/ACT IN AERO
2.0000 | INHALATION_SPRAY | Freq: Two times a day (BID) | RESPIRATORY_TRACT | Status: DC | PRN
Start: 2015-04-13 — End: 2015-04-14

## 2015-04-13 MED ORDER — NITROGLYCERIN 0.4 MG SL SUBL: 0.4000 mg | SUBLINGUAL_TABLET | SUBLINGUAL | Status: DC | PRN

## 2015-04-13 MED ORDER — PANCRELIPASE (LIP-PROT-AMYL) 12000-38000 UNITS PO CPEP
24000.0000 [IU] | ORAL_CAPSULE | Freq: Three times a day (TID) | ORAL | Status: DC
Start: 2015-04-13 — End: 2015-04-14
  Administered 2015-04-13 – 2015-04-14 (×4): 24000 [IU] via ORAL
  Filled 2015-04-13 (×4): qty 2

## 2015-04-13 MED ORDER — METOPROLOL TARTRATE 12.5 MG HALF TABLET
12.5000 mg | ORAL_TABLET | Freq: Two times a day (BID) | ORAL | Status: DC
  Administered 2015-04-13 – 2015-04-14 (×3): 12.5 mg via ORAL
  Filled 2015-04-13 (×3): qty 1

## 2015-04-13 MED ORDER — POLYVINYL ALCOHOL 1.4 % OP SOLN
Freq: Every day | OPHTHALMIC | Status: DC | PRN
Start: 2015-04-13 — End: 2015-04-14

## 2015-04-13 MED ORDER — ASPIRIN EC 81 MG PO TBEC
81.0000 mg | DELAYED_RELEASE_TABLET | Freq: Every day | ORAL | Status: DC
  Administered 2015-04-13 – 2015-04-14 (×2): 81 mg via ORAL
  Filled 2015-04-13 (×2): qty 1

## 2015-04-13 MED ORDER — ZOLPIDEM TARTRATE 5 MG PO TABS: 5.0000 mg | ORAL_TABLET | Freq: Every evening | ORAL | Status: DC | PRN

## 2015-04-13 NOTE — Care Management Obs Status (Signed)
[place] NOTIFICATION   Patient Details  Name: Tammy Schmitt MRN: [id number] Date of Birth: March 30, 1940   Medicare Observation Status Notification Given:  Yes    Marilu Favre, RN 04/13/2015, 8:21 AM

## 2015-04-13 NOTE — Progress Notes (Signed)
Patient ID: Tammy Schmitt, female   DOB: 26-Feb-1940, 75 y.o.   MRN: [id number] 2 Days Post-Op   Subjective: Patient feeling much better.  No nausea or hiccups.  Able to eat dinner last night and breakfast this AM.  Pain better controlled.  Unfortunately, she is having fevers and tachycardia.    Objective: Vital signs in last 24 hours: Temp:  [98 F (36.7 C)-101.4 F (38.6 C)] 98 F (36.7 C) (02/17 0434) Pulse Rate:  [91-123] 111 (02/17 0434) Resp:  [14-19] 19 (02/17 0434) BP: (95-140)/(51-70) 140/70 mmHg (02/17 0434) SpO2:  [86 %-96 %] 90 % (02/17 0434) Last BM Date: 04/10/15  Intake/Output from previous day: 02/16 0701 - 02/17 0700 In: 1300 [I.V.:1300] Out: 1350 [Urine:1350] Intake/Output this shift: Total I/O In: -  Out: 150 [Urine:150]  General appearance: alert, cooperative and mild distress Resp: breathing comfortably Cardio: regular rate and rhythm GI: soft, sl distended.  tender at incisions only.  Lab Results:   Recent Labs  04/12/15 1125 04/13/15 0701  WBC 17.6* 16.9*  HGB 9.7* 9.2*  HCT 31.5* 27.9*  PLT 351 290   BMET  Recent Labs  04/13/15 0701  NA 131*  K 4.4  CL 98*  CO2 24  GLUCOSE 209*  BUN 8  CREATININE 0.53  CALCIUM 8.2*   PT/INR No results for input(s): LABPROT, INR in the last 72 hours. ABG No results for input(s): PHART, HCO3 in the last 72 hours.  Invalid input(s): PCO2, PO2  Studies/Results: Dg Chest Port 1 View  04/12/2015  CLINICAL DATA:  Oxygen desaturation EXAM: PORTABLE CHEST 1 VIEW COMPARISON:  03/29/2015 FINDINGS: Moderate to large hiatal hernia.  Mild cardiac enlargement. Mild left lower lobe atelectasis. Band of opacity right mid lung zone appears consistent with subsegmental atelectasis similar to prior study. IMPRESSION: Compare sent to CT scan, allowing for comparison of different modalities, shows no significant change with continued subsegmental atelectasis right mid lung zone. Electronically Signed   By:  Skipper Cliche M.D.   On: 04/12/2015 13:47    Anti-infectives: Anti-infectives    Start     Dose/Rate Route Frequency Ordered Stop   04/11/15 1330  ceFAZolin (ANCEF) IVPB 2 g/50 mL premix     2 g 100 mL/hr over 30 Minutes Intravenous To ShortStay Surgical 04/10/15 1418 04/11/15 1552      Assessment/Plan: s/p Procedure(s): LAPAROSCOPY DIAGNOSTIC (N/A)   Patient looks much better, but fevers and tachycardia are concerning.  CXR OK.  Will also get u/a.   Pt does not have signs of bowel injury in that she is eating without nausea.   Do not want to d/c with fevers this high.   Will add back home meds to see if HR comes down.  Could have some rebound from no metoprolol for 48 hours.   Will saline lock IVF as well.    [facility] 04/13/2015

## 2015-04-13 NOTE — Care Management Obs Status (Signed)
[place] NOTIFICATION   Patient Details  Name: Tammy Schmitt MRN: [id number] Date of Birth: 07/14/1940   Medicare Observation Status Notification Given:   (minor complication of surgical procedure.)    Lewie Chamber, RN 04/13/2015, 11:57 AM

## 2015-04-13 NOTE — Progress Notes (Signed)
   04/13/15 0445  What Happened  Was fall witnessed? No  Was patient injured? No  Patient found on floor  Found by Staff-comment (RN)  Stated prior activity bathroom-unassisted  Follow Up  MD notified Dr Grandville Silos  Time MD notified [phone]  Additional tests No  Simple treatment Other (comment) (n/a)

## 2015-04-14 ENCOUNTER — Other Ambulatory Visit: Payer: Self-pay | Admitting: Hematology

## 2015-04-14 DIAGNOSIS — C786 Secondary malignant neoplasm of retroperitoneum and peritoneum: Secondary | ICD-10-CM | POA: Diagnosis not present

## 2015-04-14 DIAGNOSIS — C259 Malignant neoplasm of pancreas, unspecified: Secondary | ICD-10-CM

## 2015-04-14 LAB — CBC
HCT: 30.1 % — ABNORMAL LOW (ref 36.0–46.0)
Hemoglobin: 9.2 g/dL — ABNORMAL LOW (ref 12.0–15.0)
MCH: 25.3 pg — ABNORMAL LOW (ref 26.0–34.0)
MCHC: 30.6 g/dL (ref 30.0–36.0)
MCV: 82.7 fL (ref 78.0–100.0)
PLATELETS: 351 10*3/uL (ref 150–400)
RBC: 3.64 MIL/uL — ABNORMAL LOW (ref 3.87–5.11)
RDW: 13.7 % (ref 11.5–15.5)
WBC: 15.5 10*3/uL — AB (ref 4.0–10.5)

## 2015-04-14 NOTE — Discharge Summary (Signed)
Patient ID: Tammy Schmitt [id number] 75 y.o. 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 Patient is a 75 year old female who presented with a 2-3 month history of abdominal and back pain in December 2016. She was found to have a mass in the body of the pancreas that appeared to encase the superior mesenteric artery. She also was seen to have an ovarian mass and peritoneal implants. She has had multiple CT-guided biopsies and 2 endoscopic ultrasounds which have not yielded a diagnosis. She is actually doing much better now that she started on Creon. She had been having anorexia and weight loss. She also felt like every time she ate that the food one straight through her. This is a 30 mm difference just being on it for 1 day. She is seeing nutrition. She denies jaundice. She denies history of pancreatitis.  She has a personal history of breast cancer as well as having a mother with breast cancer. She had a son with leukemia and a daughter that had esophageal cancer at age 36.  She is brought to the hospital electively by Dr. Barry Dienes for diagnostic laparoscopy and biopsy.  Hospital Course: On the day of admission the patient was brought to the operating room and underwent diagnostic laparoscopy, peritoneal washings, peritoneal biopsies, partial omentectomy.  The surgery was uneventful.  Pathology is pending.    Postoperatively she had some transient problems with fever and tachycardia.  Her beta blockers were resumed and the tachycardia resolved.  Chest x-ray and urinalysis  looked normal.  She did not look septic and was feeling better     On Saturday, debris 18 2017 she was feeling well and requested discharge home.  She had no fever or tachycardia.  She was tolerating regular diet.  Had minimal incisional pain.  Exam revealed that her lungs were clear and her abdomen was soft with no wound problems, no distention and normal incisional tenderness.    Lab work on the morning of discharge showed a hemoglobin of 9.2 which was stable.  W BC 15,500 which was stable with postop course.     The patient states that she has an appointment with Dr. Irene Limbo, her medical oncologist,  on Tuesday to review pathology reports.  She states that she has pain pills at home and does not need a prescription.  She states that palliative care has already been involved in her management.  I asked her to make an appointment to see Dr. Barry Dienes in 2 weeks.  Diet and activities were discussed.  Consults: None  Significant Diagnostic Studies: Lab work.  Chest x-ray.  Surgical pathology.  Treatments: surgery: Diagnostic laparoscopy with partial omentectomy, peritoneal biopsy, peritoneal washings.  Disposition: Home  Patient Instructions:    Medication List    TAKE these medications        acetaminophen 500 MG tablet  Commonly known as:  TYLENOL  Take 1,000 mg by mouth 2 (two) times daily as needed for headache.     aspirin EC 81 MG tablet  Take 1 tablet (81 mg total) by mouth daily.     budesonide-formoterol 160-4.5 MCG/ACT inhaler  Commonly known as:  SYMBICORT  Inhale 2 puffs  into the lungs 2 (two) times daily as needed (for bronchitis). Reported on 03/19/2015     diphenhydramine-acetaminophen 25-500 MG Tabs tablet  Commonly known as:  TYLENOL PM  Take 1 tablet by mouth at bedtime.     HYDROmorphone 2 MG tablet  Commonly known as:  DILAUDID  Take 1 tablet (2 mg total) by mouth every 4 (four) hours as needed for severe pain.     ibuprofen 200 MG tablet  Commonly known as:   ADVIL,MOTRIN  Take 400 mg by mouth daily as needed for moderate pain.     metoprolol tartrate 25 MG tablet  Commonly known as:  LOPRESSOR  Take 0.5 tablets (12.5 mg total) by mouth 2 (two) times daily.     nitroGLYCERIN 0.4 MG SL tablet  Commonly known as:  NITROSTAT  Place 0.4 mg under the tongue every 5 (five) minutes as needed for chest pain. Reported on 03/19/2015     omeprazole 40 MG capsule  Commonly known as:  PRILOSEC  Take 1 capsule (40 mg total) by mouth 2 (two) times daily.     Pancrelipase (Lip-Prot-Amyl) 24000 units Cpep  Take 1 capsule (24,000 Units total) by mouth 3 (three) times daily with meals.     REFRESH OP  Apply 1 drop to eye daily as needed (for dry eyes).     SYSTANE OP  Apply 1 drop to eye daily as needed (for dry eyes).        Activity: activity as tolerated Diet: regular diet Wound Care: none needed  Follow-up:  With Dr. Barry Dienes in 2 weeks.  Signed: Edsel Petrin. Dalbert Batman, M.D., FACS General and minimally invasive surgery Breast and Colorectal Surgery  04/14/2015, 8:31 AM

## 2015-04-16 ENCOUNTER — Encounter: Payer: Medicare HMO | Admitting: Nutrition

## 2015-04-17 ENCOUNTER — Other Ambulatory Visit: Payer: Self-pay | Admitting: *Deleted

## 2015-04-17 ENCOUNTER — Telehealth: Payer: Self-pay | Admitting: *Deleted

## 2015-04-17 ENCOUNTER — Telehealth: Payer: Self-pay | Admitting: Hematology

## 2015-04-17 ENCOUNTER — Encounter: Payer: Self-pay | Admitting: Hematology

## 2015-04-17 ENCOUNTER — Encounter: Payer: Self-pay | Admitting: *Deleted

## 2015-04-17 ENCOUNTER — Ambulatory Visit (HOSPITAL_BASED_OUTPATIENT_CLINIC_OR_DEPARTMENT_OTHER): Payer: Medicare HMO | Admitting: Hematology

## 2015-04-17 ENCOUNTER — Other Ambulatory Visit: Payer: Self-pay | Admitting: Radiology

## 2015-04-17 VITALS — BP 104/52 | HR 96 | Temp 98.4°F | Resp 19 | Ht <= 58 in | Wt 143.3 lb

## 2015-04-17 DIAGNOSIS — C259 Malignant neoplasm of pancreas, unspecified: Secondary | ICD-10-CM

## 2015-04-17 DIAGNOSIS — C786 Secondary malignant neoplasm of retroperitoneum and peritoneum: Secondary | ICD-10-CM

## 2015-04-17 MED ORDER — FENTANYL 25 MCG/HR TD PT72: 25.0000 ug | MEDICATED_PATCH | TRANSDERMAL | Status: DC

## 2015-04-17 MED ORDER — ONDANSETRON HCL 8 MG PO TABS
8.0000 mg | ORAL_TABLET | Freq: Two times a day (BID) | ORAL | Status: DC | PRN
Start: 2015-04-17 — End: 2015-08-27

## 2015-04-17 MED ORDER — SENNOSIDES-DOCUSATE SODIUM 8.6-50 MG PO TABS: 2.0000 | ORAL_TABLET | Freq: Two times a day (BID) | ORAL | Status: AC

## 2015-04-17 MED ORDER — LIDOCAINE-PRILOCAINE 2.5-2.5 % EX CREA: TOPICAL_CREAM | CUTANEOUS | Status: DC

## 2015-04-17 MED ORDER — LORAZEPAM 0.5 MG PO TABS: 0.5000 mg | ORAL_TABLET | Freq: Four times a day (QID) | ORAL | Status: DC | PRN

## 2015-04-17 MED ORDER — PROCHLORPERAZINE MALEATE 10 MG PO TABS: 10.0000 mg | ORAL_TABLET | Freq: Four times a day (QID) | ORAL | Status: DC | PRN

## 2015-04-17 NOTE — Telephone Encounter (Signed)
Per staff message and POF I have scheduled appts. Advised scheduler of appts. JMW  

## 2015-04-17 NOTE — Progress Notes (Signed)
[place] Work  Clinical Social Work was referred by [facility] hosp[facility] for assessment of psychosocial needs.  Clinical Social Worker met with patient in exam room at [facility] to offer support and assess for needs.  Patient stated she was doing well and her major concern was getting a hospital bed in her home.  CSW consulted with patient navigator who is aware of patients need and will complete referral.  CSW explained CSW role and support services at [facility].  Patient expressed interest in completing Advance Directives.  CSW provided contact information and patient plans to contact CSW to schedule an appointment to complete advance directives.          Johnnye Lana, MSW, LCSW, OSW-C Clinical Social Worker [facility] [phone]

## 2015-04-17 NOTE — Telephone Encounter (Signed)
per pof to sch pt appt-sch IR MW sch trmt-gave pt copy of avs

## 2015-04-17 NOTE — Progress Notes (Signed)
Oncology Nurse Navigator Documentation  Oncology Nurse Navigator Flowsheets 04/17/2015  Navigator Location [facility]  Navigator Encounter Type Follow-up Appt  Telephone -  Abnormal Finding Date -  Patient Visit Type Follow-up  Treatment Phase Pre-Tx/Tx Discussion  Barriers/Navigation Needs Education;Family concerns  Education  Symptom Management;Preparing for Upcoming Treatment  Interventions Other--message left with collaborative nurse regarding patient request for hospital bed  Referrals [place] with Patient 15  Annaliesa reports that she has had contact w/palliative care. Inquiring about hospital bed for 1st floor since she is in 2 story townhouse and both bedrooms are upstairs. There is a half-bath downstairs to use toilet and sponge off at the sink. Always has a family member with her in home. She is anxious to get started on treatment.

## 2015-04-18 ENCOUNTER — Encounter (HOSPITAL_COMMUNITY): Payer: Self-pay

## 2015-04-18 ENCOUNTER — Ambulatory Visit (HOSPITAL_COMMUNITY)
Admission: RE | Admit: 2015-04-18 | Discharge: 2015-04-18 | Disposition: A | Discharge status: Home / Self Care | Payer: Medicare HMO | Source: Ambulatory Visit | Attending: Hematology | Admitting: Hematology

## 2015-04-18 ENCOUNTER — Other Ambulatory Visit: Payer: Medicare HMO

## 2015-04-18 ENCOUNTER — Encounter: Payer: Self-pay | Admitting: *Deleted

## 2015-04-18 ENCOUNTER — Other Ambulatory Visit: Payer: Self-pay | Admitting: Hematology

## 2015-04-18 DIAGNOSIS — I10 Essential (primary) hypertension: Secondary | ICD-10-CM | POA: Diagnosis not present

## 2015-04-18 DIAGNOSIS — C259 Malignant neoplasm of pancreas, unspecified: Secondary | ICD-10-CM | POA: Diagnosis present

## 2015-04-18 DIAGNOSIS — J449 Chronic obstructive pulmonary disease, unspecified: Secondary | ICD-10-CM | POA: Insufficient documentation

## 2015-04-18 DIAGNOSIS — Z7951 Long term (current) use of inhaled steroids: Secondary | ICD-10-CM | POA: Diagnosis not present

## 2015-04-18 DIAGNOSIS — I340 Nonrheumatic mitral (valve) insufficiency: Secondary | ICD-10-CM | POA: Diagnosis not present

## 2015-04-18 DIAGNOSIS — Z87891 Personal history of nicotine dependence: Secondary | ICD-10-CM | POA: Diagnosis not present

## 2015-04-18 DIAGNOSIS — Z7982 Long term (current) use of aspirin: Secondary | ICD-10-CM | POA: Diagnosis not present

## 2015-04-18 DIAGNOSIS — E785 Hyperlipidemia, unspecified: Secondary | ICD-10-CM | POA: Insufficient documentation

## 2015-04-18 DIAGNOSIS — Z951 Presence of aortocoronary bypass graft: Secondary | ICD-10-CM | POA: Insufficient documentation

## 2015-04-18 DIAGNOSIS — K219 Gastro-esophageal reflux disease without esophagitis: Secondary | ICD-10-CM | POA: Insufficient documentation

## 2015-04-18 DIAGNOSIS — E669 Obesity, unspecified: Secondary | ICD-10-CM | POA: Insufficient documentation

## 2015-04-18 DIAGNOSIS — Z86010 Personal history of colonic polyps: Secondary | ICD-10-CM | POA: Insufficient documentation

## 2015-04-18 DIAGNOSIS — Z955 Presence of coronary angioplasty implant and graft: Secondary | ICD-10-CM | POA: Diagnosis not present

## 2015-04-18 DIAGNOSIS — I2510 Atherosclerotic heart disease of native coronary artery without angina pectoris: Secondary | ICD-10-CM | POA: Insufficient documentation

## 2015-04-18 DIAGNOSIS — I252 Old myocardial infarction: Secondary | ICD-10-CM | POA: Insufficient documentation

## 2015-04-18 DIAGNOSIS — Z853 Personal history of malignant neoplasm of breast: Secondary | ICD-10-CM | POA: Insufficient documentation

## 2015-04-18 DIAGNOSIS — Z8249 Family history of ischemic heart disease and other diseases of the circulatory system: Secondary | ICD-10-CM | POA: Diagnosis not present

## 2015-04-18 DIAGNOSIS — Z800 Family history of malignant neoplasm of digestive organs: Secondary | ICD-10-CM | POA: Insufficient documentation

## 2015-04-18 DIAGNOSIS — C786 Secondary malignant neoplasm of retroperitoneum and peritoneum: Secondary | ICD-10-CM

## 2015-04-18 LAB — PROTIME-INR
INR: 1.16 (ref 0.00–1.49)
Prothrombin Time: 14.5 seconds (ref 11.6–15.2)

## 2015-04-18 LAB — BASIC METABOLIC PANEL
ANION GAP: 12 (ref 5–15)
BUN: 13 mg/dL (ref 6–20)
CALCIUM: 8.4 mg/dL — AB (ref 8.9–10.3)
CO2: 27 mmol/L (ref 22–32)
Chloride: 92 mmol/L — ABNORMAL LOW (ref 101–111)
Creatinine, Ser: 0.66 mg/dL (ref 0.44–1.00)
Glucose, Bld: 98 mg/dL (ref 65–99)
Potassium: 3.9 mmol/L (ref 3.5–5.1)
Sodium: 131 mmol/L — ABNORMAL LOW (ref 135–145)

## 2015-04-18 LAB — CBC WITH DIFFERENTIAL/PLATELET
BASOS ABS: 0 10*3/uL (ref 0.0–0.1)
BASOS PCT: 0 %
Eosinophils Absolute: 0 10*3/uL (ref 0.0–0.7)
Eosinophils Relative: 0 %
HEMATOCRIT: 30.1 % — AB (ref 36.0–46.0)
HEMOGLOBIN: 9.5 g/dL — AB (ref 12.0–15.0)
Lymphocytes Relative: 8 %
Lymphs Abs: 1.2 10*3/uL (ref 0.7–4.0)
MCH: 26 pg (ref 26.0–34.0)
MCHC: 31.6 g/dL (ref 30.0–36.0)
MCV: 82.2 fL (ref 78.0–100.0)
Monocytes Absolute: 2.2 10*3/uL — ABNORMAL HIGH (ref 0.1–1.0)
Monocytes Relative: 15 %
NEUTROS ABS: 11.6 10*3/uL — AB (ref 1.7–7.7)
NEUTROS PCT: 77 %
Platelets: 413 10*3/uL — ABNORMAL HIGH (ref 150–400)
RBC: 3.66 MIL/uL — AB (ref 3.87–5.11)
RDW: 14.1 % (ref 11.5–15.5)
WBC: 15.1 10*3/uL — ABNORMAL HIGH (ref 4.0–10.5)

## 2015-04-18 MED ORDER — FENTANYL CITRATE (PF) 100 MCG/2ML IJ SOLN
INTRAMUSCULAR | Status: AC
  Filled 2015-04-18: qty 2

## 2015-04-18 MED ORDER — FENTANYL CITRATE (PF) 100 MCG/2ML IJ SOLN
INTRAMUSCULAR | Status: AC | PRN
  Administered 2015-04-18 (×2): 25 ug via INTRAVENOUS

## 2015-04-18 MED ORDER — LIDOCAINE HCL 1 % IJ SOLN
INTRAMUSCULAR | Status: AC
  Filled 2015-04-18: qty 20

## 2015-04-18 MED ORDER — SODIUM CHLORIDE 0.9 % IV SOLN
INTRAVENOUS | Status: DC
  Administered 2015-04-18: 13:00:00 via INTRAVENOUS

## 2015-04-18 MED ORDER — HEPARIN SOD (PORK) LOCK FLUSH 100 UNIT/ML IV SOLN
INTRAVENOUS | Status: DC
Start: 2015-04-18 — End: 2015-04-19
  Filled 2015-04-18: qty 5

## 2015-04-18 MED ORDER — CEFAZOLIN SODIUM-DEXTROSE 2-3 GM-% IV SOLR
2.0000 g | INTRAVENOUS | Status: AC
  Administered 2015-04-18: 2 g via INTRAVENOUS

## 2015-04-18 MED ORDER — CEFAZOLIN SODIUM-DEXTROSE 2-3 GM-% IV SOLR
INTRAVENOUS | Status: AC
  Filled 2015-04-18: qty 50

## 2015-04-18 MED ORDER — HEPARIN SOD (PORK) LOCK FLUSH 100 UNIT/ML IV SOLN
INTRAVENOUS | Status: AC | PRN
  Administered 2015-04-18: 500 [IU]

## 2015-04-18 MED ORDER — MIDAZOLAM HCL 2 MG/2ML IJ SOLN
INTRAMUSCULAR | Status: AC | PRN
  Administered 2015-04-18: 1 mg via INTRAVENOUS
  Administered 2015-04-18: 0.5 mg via INTRAVENOUS

## 2015-04-18 MED ORDER — MIDAZOLAM HCL 2 MG/2ML IJ SOLN
INTRAMUSCULAR | Status: AC
  Filled 2015-04-18: qty 4

## 2015-04-18 NOTE — Sedation Documentation (Signed)
Patient denies pain and is resting comfortably.  

## 2015-04-18 NOTE — Discharge Instructions (Signed)
Implanted Port Insertion, Care After °Refer to this sheet in the next few weeks. These instructions provide you with information on caring for yourself after your procedure. Your health care provider may also give you more specific instructions. Your treatment has been planned according to current medical practices, but problems sometimes occur. Call your health care provider if you have any problems or questions after your procedure. °WHAT TO EXPECT AFTER THE PROCEDURE °After your procedure, it is typical to have the following:  °· Discomfort at the port insertion site. Ice packs to the area will help. °· Bruising on the skin over the port. This will subside in 3-4 days. °HOME CARE INSTRUCTIONS °· After your port is placed, you will get a manufacturer's information card. The card has information about your port. Keep this card with you at all times.   °· Know what kind of port you have. There are many types of ports available.   °· Wear a medical alert bracelet in case of an emergency. This can help alert health care workers that you have a port.   °· The port can stay in for as long as your health care provider believes it is necessary.   °· A home health care nurse may give medicines and take care of the port.   °· You or a family member can get special training and directions for giving medicine and taking care of the port at home.   °SEEK MEDICAL CARE IF:  °· Your port does not flush or you are unable to get a blood return.   °· You have a fever or chills. °SEEK IMMEDIATE MEDICAL CARE IF: °· You have new fluid or pus coming from your incision.   °· You notice a bad smell coming from your incision site.   °· You have swelling, pain, or more redness at the incision or port site.   °· You have chest pain or shortness of breath. °  °This information is not intended to replace advice given to you by your health care provider. Make sure you discuss any questions you have with your health care provider. °  °Document  Released: 12/01/2012 Document Revised: 02/15/2013 Document Reviewed: 12/01/2012 °Elsevier Interactive Patient Education ©2016 Elsevier Inc[employer] °Implanted Port Home Guide °An implanted port is a type of central line that is placed under the skin. Central lines are used to provide IV access when treatment or nutrition needs to be given through a person's veins. Implanted ports are used for long-term IV access. An implanted port may be placed because:  °· You need IV medicine that would be irritating to the small veins in your hands or arms.   °· You need long-term IV medicines, such as antibiotics.   °· You need IV nutrition for a long period.   °· You need frequent blood draws for lab tests.   °· You need dialysis.   °Implanted ports are usually placed in the chest area, but they can also be placed in the upper arm, the abdomen, or the leg. An implanted port has two main parts:  °· Reservoir. The reservoir is round and will appear as a small, raised area under your skin. The reservoir is the part where a needle is inserted to give medicines or draw blood.   °· Catheter. The catheter is a thin, flexible tube that extends from the reservoir. The catheter is placed into a large vein. Medicine that is inserted into the reservoir goes into the catheter and then into the vein.   °HOW WILL I CARE FOR MY INCISION SITE? °Do not get the   incision site wet. Bathe or shower as directed by your health care provider.  °HOW IS MY PORT ACCESSED? °Special steps must be taken to access the port:  °· Before the port is accessed, a numbing cream can be placed on the skin. This helps numb the skin over the port site.   °· Your health care provider uses a sterile technique to access the port. °· Your health care provider must put on a mask and sterile gloves. °· The skin over your port is cleaned carefully with an antiseptic and allowed to dry. °· The port is gently pinched between sterile gloves, and a needle is inserted into the  port. °· Only "non-coring" port needles should be used to access the port. Once the port is accessed, a blood return should be checked. This helps ensure that the port is in the vein and is not clogged.   °· If your port needs to remain accessed for a constant infusion, a clear (transparent) bandage will be placed over the needle site. The bandage and needle will need to be changed every week, or as directed by your health care provider.   °· Keep the bandage covering the needle clean and dry. Do not get it wet. Follow your health care provider's instructions on how to take a shower or bath while the port is accessed.   °· If your port does not need to stay accessed, no bandage is needed over the port.   °WHAT IS FLUSHING? °Flushing helps keep the port from getting clogged. Follow your health care provider's instructions on how and when to flush the port. Ports are usually flushed with saline solution or a medicine called heparin. The need for flushing will depend on how the port is used.  °· If the port is used for intermittent medicines or blood draws, the port will need to be flushed:   °· After medicines have been given.   °· After blood has been drawn.   °· As part of routine maintenance.   °· If a constant infusion is running, the port may not need to be flushed.   °HOW LONG WILL MY PORT STAY IMPLANTED? °The port can stay in for as long as your health care provider thinks it is needed. When it is time for the port to come out, surgery will be done to remove it. The procedure is similar to the one performed when the port was put in.  °WHEN SHOULD I SEEK IMMEDIATE MEDICAL CARE? °When you have an implanted port, you should seek immediate medical care if:  °· You notice a bad smell coming from the incision site.   °· You have swelling, redness, or drainage at the incision site.   °· You have more swelling or pain at the port site or the surrounding area.   °· You have a fever that is not controlled with  medicine. °  °This information is not intended to replace advice given to you by your health care provider. Make sure you discuss any questions you have with your health care provider. °  °Document Released: 02/10/2005 Document Revised: 12/01/2012 Document Reviewed: 10/18/2012 °Elsevier Interactive Patient Education ©2016 Elsevier Inc. ° ° °Moderate Conscious Sedation, Adult, Care After °Refer to this sheet in the next few weeks. These instructions provide you with information on caring for yourself after your procedure. Your health care provider may also give you more specific instructions. Your treatment has been planned according to current medical practices, but problems sometimes occur. Call your health care provider if you have any problems or questions   after your procedure. °WHAT TO EXPECT AFTER THE PROCEDURE  °After your procedure: °· You may feel sleepy, clumsy, and have poor balance for several hours. °· Vomiting may occur if you eat too soon after the procedure. °HOME CARE INSTRUCTIONS °· Do not participate in any activities where you could become injured for at least 24 hours. Do not: °¨ Drive. °¨ Swim. °¨ Ride a bicycle. °¨ Operate heavy machinery. °¨ Cook. °¨ Use power tools. °¨ Climb ladders. °¨ Work from a high place. °· Do not make important decisions or sign legal documents until you are improved. °· If you vomit, drink water, juice, or soup when you can drink without vomiting. Make sure you have little or no nausea before eating solid foods. °· Only take over-the-counter or prescription medicines for pain, discomfort, or fever as directed by your health care provider. °· Make sure you and your family fully understand everything about the medicines given to you, including what side effects may occur. °· You should not drink alcohol, take sleeping pills, or take medicines that cause drowsiness for at least 24 hours. °· If you smoke, do not smoke without supervision. °· If you are feeling better, you  may resume normal activities 24 hours after you were sedated. °· Keep all appointments with your health care provider. °SEEK MEDICAL CARE IF: °· Your skin is pale or bluish in color. °· You continue to feel nauseous or vomit. °· Your pain is getting worse and is not helped by medicine. °· You have bleeding or swelling. °· You are still sleepy or feeling clumsy after 24 hours. °SEEK IMMEDIATE MEDICAL CARE IF: °· You develop a rash. °· You have difficulty breathing. °· You develop any type of allergic problem. °· You have a fever. °MAKE SURE YOU: °· Understand these instructions. °· Will watch your condition. °· Will get help right away if you are not doing well or get worse. °  °This information is not intended to replace advice given to you by your health care provider. Make sure you discuss any questions you have with your health care provider. °  °Document Released: 12/01/2012 Document Revised: 03/03/2014 Document Reviewed: 12/01/2012 °Elsevier Interactive Patient Education ©2016 Elsevier Inc. ° °

## 2015-04-18 NOTE — Progress Notes (Signed)
I agree with this plan.

## 2015-04-18 NOTE — Procedures (Signed)
RIJV PAC SVC RA No comp/EBL 

## 2015-04-18 NOTE — Progress Notes (Signed)
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 As suspected the pathology obtained was consistent with metastatic pancreatic adenocarcinoma. Patient notes abdominal fullness. Has been using her pain medication around-the-clock. She has been established with palliative care services at home. Notes no nausea or vomiting. Limited oral intake. Tolerated surgery well. Her copy considerations are healing well. Feels that she might need something more for pain control. PET/CT scan results and images reviewed with her and her to accompanying brothers.   MEDICAL HISTORY:  Past Medical History  Diagnosis Date  . Cataract   . GERD (gastroesophageal reflux disease)   . Myocardial infarction ([place]) 2001  . CAD (coronary artery disease)   . HLD (hyperlipidemia)   . Diverticulitis   . Colon polyp   . Hypertension   . Hemorrhoids     external  . Obesity   . DDD (degenerative disc disease), lumbar   . Prolapsed uterus   . Hyperlipidemia   . Mitral regurgitation   . Colon polyp   . Family history of adverse reaction to anesthesia     maternal aunt came out of surgery not knowing what happened and where she was  . COPD (chronic obstructive pulmonary disease) ([place])     Patient denies  . Cancer ([place]) 2000    breast  . Breast cancer ([place]) 2001    SURGICAL HISTORY: Past Surgical History  Procedure Laterality Date  . Breast surgery Left     lymph nodes removed also  . Eye surgery Bilateral     cataracts removed  . Ovary surgery      one  tube removed , one ovary trimmed  down  . Heart bypass      x 2, stent  . Coronary artery bypass graft  2001    x 2  . Cardiac catheterization  2001  . Eus N/A 03/08/2015    Procedure: UPPER ENDOSCOPIC ULTRASOUND (EUS) LINEAR;  Surgeon: Milus Banister, MD;  Location: [facility];  Service: Endoscopy;  Laterality: N/A;  radial linear  . Colonoscopy    . Laparoscopy N/A 04/11/2015    Procedure: LAPAROSCOPY DIAGNOSTIC;  Surgeon: Stark Klein, MD;  Location: [facility];  Service: General;  Laterality: N/A;    SOCIAL HISTORY: Social History   Social History  . Marital Status: Divorced    Spouse Name: N/A  . Number of Children: 3  . Years of Education: N/A   Occupational History  . retired    Social History Main Topics  . Smoking status: Former Smoker -- 45 years    Types: Cigarettes    Quit date: 12/26/1999  . Smokeless tobacco: Never Used  . Alcohol Use: No  . Drug Use: No  . Sexual Activity: Not on file   Other Topics Concern  . Not on file   Social History Narrative   Retired from working with disabled individuals    Two sons and one daughter (daughter and one son have [place]   Divorced, lives alone; moved to [place] from [place] May 2016   She likes to Psychologist, occupational and exercise.     FAMILY  HISTORY: Family History  Problem Relation Age of Onset  . Breast cancer Mother 7  . Heart disease Father   . Heart disease Brother   . Esophageal cancer Daughter 26    died at 60  . Leukemia Son 24  . Colon cancer Neg Hx   . Stomach cancer Neg Hx     ALLERGIES:  is allergic to statins; ace inhibitors; codeine; and levaquin.  MEDICATIONS:  Current Outpatient Prescriptions  Medication Sig Dispense Refill  . acetaminophen (TYLENOL) 500 MG tablet Take 1,000 mg by mouth 2 (two) times daily as needed for headache.     Marland Kitchen aspirin EC 81 MG tablet Take 1 tablet (81 mg total) by mouth daily.    . budesonide-formoterol (SYMBICORT) 160-4.5 MCG/ACT inhaler Inhale 2 puffs into the lungs 2 (two) times daily as needed (for bronchitis).  Reported on 03/19/2015    . fentaNYL (DURAGESIC - DOSED MCG/HR) 25 MCG/HR patch Place 1 patch (25 mcg total) onto the skin every 3 (three) days. 10 patch 0  . HYDROmorphone (DILAUDID) 2 MG tablet Take 1 tablet (2 mg total) by mouth every 4 (four) hours as needed for severe pain. 120 tablet 0  . lidocaine-prilocaine (EMLA) cream Apply to affected area once 30 g 3  . LORazepam (ATIVAN) 0.5 MG tablet Take 1 tablet (0.5 mg total) by mouth every 6 (six) hours as needed (Nausea or vomiting). 30 tablet 0  . metoprolol tartrate (LOPRESSOR) 25 MG tablet Take 0.5 tablets (12.5 mg total) by mouth 2 (two) times daily. 90 tablet 3  . nitroGLYCERIN (NITROSTAT) 0.4 MG SL tablet Place 0.4 mg under the tongue every 5 (five) minutes as needed for chest pain. Reported on 03/19/2015    . omeprazole (PRILOSEC) 40 MG capsule Take 1 capsule (40 mg total) by mouth 2 (two) times daily. 60 capsule 11  . ondansetron (ZOFRAN) 8 MG tablet Take 1 tablet (8 mg total) by mouth 2 (two) times daily as needed (Nausea or vomiting). 30 tablet 1  . Pancrelipase, Lip-Prot-Amyl, 24000 units CPEP Take 1 capsule (24,000 Units total) by mouth 3 (three) times daily with meals. 180 capsule 1  . Polyethyl Glycol-Propyl Glycol (SYSTANE OP) Apply 1 drop to eye daily as needed (for dry eyes).    . Polyvinyl Alcohol-Povidone (REFRESH OP) Apply 1 drop to eye daily as needed (for dry eyes).    . prochlorperazine (COMPAZINE) 10 MG tablet Take 1 tablet (10 mg total) by mouth every 6 (six) hours as needed (Nausea or vomiting). 30 tablet 1  . senna-docusate (SENNA S) 8.6-50 MG tablet Take 2 tablets by mouth 2 (two) times daily. 120 tablet 3   No current facility-administered medications for this visit.   Facility-Administered Medications Ordered in Other Visits  Medication Dose Route Frequency Provider Last Rate Last Dose  . 0.9 %  sodium chloride infusion   Intravenous Continuous Darrell K Allred, PA-C 50 mL/hr at 04/18/15 1306    . ceFAZolin (ANCEF)  2-3 GM-% IVPB SOLR           . fentaNYL (SUBLIMAZE) 100 MCG/2ML injection           . heparin lock flush 100 UNIT/ML injection           . lidocaine (XYLOCAINE) 1 % (with pres) injection           . midazolam (VERSED) 2 MG/2ML injection             REVIEW OF SYSTEMS:    10 Point  review of Systems was done is negative except as noted above.  PHYSICAL EXAMINATION: ECOG PERFORMANCE STATUS: 1 - Symptomatic but completely ambulatory  . Filed Vitals:   04/17/15 1333  BP: 104/52  Pulse: 96  Temp: 98.4 F (36.9 C)  Resp: 19   Filed Weights   04/17/15 1333  Weight: 143 lb 4.8 oz (65 kg)   .Body mass index is 29.96 kg/(m^2).  GENERAL:alert, mild distress due to pain and uncertainty of diagnosis. SKIN: skin color, texture, turgor are normal, no rashes or significant lesions EYES: normal, conjunctiva are pink and non-injected, sclera clear OROPHARYNX:no exudate, no erythema and lips, buccal mucosa, and tongue normal  NECK: supple, no JVD, thyroid normal size, non-tender, without nodularity LYMPH:  no palpable lymphadenopathy in the cervical, axillary or inguinal LUNGS: clear to auscultation with normal respiratory effort HEART: regular rate & rhythm,  no murmurs and no lower extremity edema ABDOMEN: TTP epigastric regions, mild abdominal distention. normoactive BM , no rigidity or rebound. Healing laparoscopic surgical incisions. No open wounds. Musculoskeletal: no cyanosis of digits and no clubbing  PSYCH: alert & oriented x 3 with fluent speech NEURO: no focal motor/sensory deficits  LABORATORY DATA:  I have reviewed the data as listed  . CBC Latest Ref Rng 04/18/2015 04/14/2015 04/13/2015  WBC 4.0 - 10.5 K/uL 15.1(H) 15.5(H) 16.9(H)  Hemoglobin 12.0 - 15.0 g/dL 9.5(L) 9.2(L) 9.2(L)  Hematocrit 36.0 - 46.0 % 30.1(L) 30.1(L) 27.9(L)  Platelets 150 - 400 K/uL 413(H) 351 290   . CBC    Component Value Date/Time   WBC 15.1* 04/18/2015 1300   WBC 10.3 04/02/2015 1222   WBC 7.7  11/15/2014 1538   RBC 3.66* 04/18/2015 1300   RBC 4.39 04/02/2015 1222   RBC 5.07 11/15/2014 1538   HGB 9.5* 04/18/2015 1300   HGB 11.9 04/02/2015 1222   HGB 14.3 11/15/2014 1538   HCT 30.1* 04/18/2015 1300   HCT 36.9 04/02/2015 1222   HCT 44.2 11/15/2014 1538   PLT 413* 04/18/2015 1300   PLT 308 04/02/2015 1222   MCV 82.2 04/18/2015 1300   MCV 84.1 04/02/2015 1222   MCV 87.2 11/15/2014 1538   MCH 26.0 04/18/2015 1300   MCH 27.1 04/02/2015 1222   MCH 28.1 11/15/2014 1538   MCHC 31.6 04/18/2015 1300   MCHC 32.2 04/02/2015 1222   MCHC 32.3 11/15/2014 1538   RDW 14.1 04/18/2015 1300   RDW 13.4 04/02/2015 1222   LYMPHSABS 1.2 04/18/2015 1300   LYMPHSABS 1.6 04/02/2015 1222   MONOABS 2.2* 04/18/2015 1300   MONOABS 1.0* 04/02/2015 1222   EOSABS 0.0 04/18/2015 1300   EOSABS 0.1 04/02/2015 1222   BASOSABS 0.0 04/18/2015 1300   BASOSABS 0.0 04/02/2015 1222    CMP Latest Ref Rng 04/18/2015 04/13/2015 04/02/2015  Glucose 65 - 99 mg/dL 98 209(H) 114  BUN 6 - 20 mg/dL 13 8 10.8  Creatinine 0.44 - 1.00 mg/dL 0.66 0.53 0.7  Sodium 135 - 145 mmol/L 131(L) 131(L) 139  Potassium 3.5 - 5.1 mmol/L 3.9 4.4 3.4(L)  Chloride 101 - 111 mmol/L 92(L) 98(L) -  CO2 22 - 32 mmol/L 27 24 25   Calcium 8.9 - 10.3 mg/dL 8.4(L) 8.2(L) 9.3  Total Protein 6.4 - 8.3 g/dL - - 6.8  Total Bilirubin 0.20 - 1.20 mg/dL - - 0.36  Alkaline Phos 40 - 150 U/L - - 90  AST 5 - 34 U/L - - 16  ALT 0 - 55 U/L - - <9   Component  Latest Ref Rng 03/19/2015  CEA     0.0 - 4.7 ng/mL 2.1  Cancer Antigen (CA) 125     0.0 - 38.1 U/mL 481.7 (H)  CA 19-9     0 - 35 U/mL 953 (H)       RADIOGRAPHIC STUDIES: I have personally reviewed the radiological images as listed and agreed with the findings in the report. Nm Pet Image Initial (pi) Skull Base To Thigh  03/29/2015  CLINICAL DATA:  Initial treatment strategy for pancreatic mass. History of left breast cancer 15 years ago with chemotherapy and radiation therapy.  Biopsy of left ovary last week. EXAM: NUCLEAR MEDICINE PET SKULL BASE TO THIGH TECHNIQUE: 12.3 mCi F-18 FDG was injected intravenously. Full-ring PET imaging was performed from the skull base to thigh after the radiotracer. CT data was obtained and used for attenuation correction and anatomic localization. FASTING BLOOD GLUCOSE:  Value: 98 mg/dl COMPARISON:  Abdominal CT of 02/09/2015. FINDINGS: NECK No areas of abnormal hypermetabolism. CHEST Left suprahilar hypermetabolism along the pulmonary artery is favored to be vascular and is without correlate adenopathy. This measures a S.U.V. max of 3.4 including on image 75/ series 3. ABDOMEN/PELVIS Hypermetabolism corresponding to the soft tissue mass centered about the inferior most aspect of the uncinate process of the pancreas and adjacent transverse duodenum. This is not well visualized on today's CT, but measures a S.U.V. max of 11.6, including on image 139/ series 3. Extensive omental/ peritoneal metastasis. This is progressive since the prior diagnostic CT. An example omental nodule measures 1.2 cm and a S.U.V. max of 5.1 on image 122/series 3. More inferior omental nodule is new or significantly enlarged since the prior exam, measures 1.3 cm and a S.U.V. max of 5.9 on image 131/series 3. Innumerable omental nodules are identified concurrent with hypermetabolism more inferiorly, including on image 162/ series 3. Small bowel mesenteric nodules or nodes which are hypermetabolic. Example at 8 mm and a S.U.V. max of 4.5 on image 157/ series 3. Left ovarian mass demonstrates relatively low-level hypermetabolism. This measures 4.2 x 3.5 cm and a S.U.V. max of 3.5 on image 197/series 3. SKELETON No abnormal marrow activity. CT IMAGES PERFORMED FOR ATTENUATION CORRECTION Bilateral carotid atherosclerosis.  No cervical adenopathy. Prior median sternotomy. Mild cardiomegaly. Moderate hiatal hernia. Trace right pleural fluid. Moderate centrilobular emphysema. Volume loss in  inferior right upper lobe. Right lower lobe 7 mm nodule on image 82/series 3. Left lower lobe 6 mm nodule on image 79/series 3. Vague 4 mm left upper lobe pulmonary nodule. Development of moderate volume abdominal ascites. No bowel obstruction or other acute complication. Advanced abdominal aortic atherosclerosis. Pessary in place. IMPRESSION: 1. Hypermetabolism corresponding to ill-defined soft tissue mass arising either from the uncinate process of the pancreas or the transverse duodenum. This is consistent with primary malignancy. 2. Moderate to marked progression of widespread omental/peritoneal metastasis. Development of moderate volume ascites. Nodal metastasis within the small bowel mesentery. If successful tissue sampling has not been performed, laparoscopy may be necessary (presuming endoscopic ultrasound of the presumed primary is not possible). 3. Low-level hypermetabolism in the left ovary is favored to represent a a synchronous indolent and likely benign neoplasm, given the biopsy results suggesting a fibroma. 4. Bilateral lung nodules. Although technically indeterminate, suspicious for pulmonary metastasis. 5. Trace right pleural fluid. 6. Hiatal hernia. Electronically Signed   By: Abigail Miyamoto M.D.   On: 03/29/2015 12:15   Ct Biopsy  03/26/2015  INDICATION: Left ovarian mass EXAM: CT BIOPSY MEDICATIONS:  None. ANESTHESIA/SEDATION: Fentanyl 50 mcg IV; Versed 2.5 mg IV Moderate Sedation Time:  18 The patient was continuously monitored during the procedure by the interventional radiology nurse under my direct supervision. FLUOROSCOPY TIME:  None COMPLICATIONS: None immediate. PROCEDURE: Informed written consent was obtained from the patient after a thorough discussion of the procedural risks, benefits and alternatives. All questions were addressed. Maximal Sterile Barrier Technique was utilized including caps, mask, sterile gowns, sterile gloves, sterile drape, hand hygiene and skin antiseptic. A  timeout was performed prior to the initiation of the procedure. Under CT guidance, a(n) 17 gauge guide needle was advanced into the left ovarian mass via left trans gluteal approach. Subsequently 2 18 gauge core biopsies were obtained. The guide needle was removed. Post biopsy images demonstrate no hemorrhage. Patient tolerated the procedure well without complication. Vital sign monitoring by nursing staff during the procedure will continue as patient is in the special procedures unit for post procedure observation. The images document guide needle placement within the left ovarian mass. Post biopsy images demonstrate no hemorrhage. IMPRESSION: Successful core biopsy of a left ovarian mass. Electronically Signed   By: Marybelle Killings M.D.   On: 03/26/2015 16:57   Dg Chest Port 1 View  04/12/2015  CLINICAL DATA:  Oxygen desaturation EXAM: PORTABLE CHEST 1 VIEW COMPARISON:  03/29/2015 FINDINGS: Moderate to large hiatal hernia.  Mild cardiac enlargement. Mild left lower lobe atelectasis. Band of opacity right mid lung zone appears consistent with subsegmental atelectasis similar to prior study. IMPRESSION: Compare sent to CT scan, allowing for comparison of different modalities, shows no significant change with continued subsegmental atelectasis right mid lung zone. Electronically Signed   By: Skipper Cliche M.D.   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 EUS bx - atypical cells not definitive diagnosis of pancreatic adenocarcinoma -though this is the some likely diagnosis. CA 19-9 elevated. 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 laproscopic surgical biopsy  provided definitive diagnosis of pancreatic adenocarcinoma. 2) Neoplasm related abdominal pain 3) Pancreatic exocrine deficiency 4) significant h/o CAD on ASA  Plan -patient was using about 2-4 mg of Dilaudid 6 times a day. Started on fentanyl 25 g patch baseline pain control. -continue  dilaudid oral when necessary for breakthrough pain.  bowel prophylaxis with senna S - 2 tablets twice a day unit if patient is having diarrhea and use to 2 tablets at nighttime. -continuecreon for pancreatic exocrine insufficiency -home palliative care services referral given previously and have connected with the patient. Might need a hospital bed at home. [facility] placement. -We spent more than 20 minutes discussing the diagnosis, staging, prognosis, looking at PET/CT images and discussing pathology results and defining treatment options. -We talked about the fact that this is not a curable condition and that treatments would be palliative. We discussed best supportive cares versus single agent gemcitabine versus gemcitabine plus Abraxane versus FOLFIRINOX. -Patient chooses to go with gemcitabine plus Abraxane. I give her a printout about this regimen. We shall begin day 1 and day 15 treatments and skipping day 8 based on data from [facility] last year showing better tolerability with no obvious decline in effectiveness of treatment. -  Patient has been set up for chemotherapy counseling tomorrow. -She will follow-up to start her treatment on 02/16/2016. We will get another baseline CA-19-9 level before treatment started. -I will see her back in 1 week after chemotherapy for toxicity assessment.  -continue f/u with PCP for continue cares of other medical issues.  4) h/o left breast hormone positive cancer [facility] yrs ago at [facility] in [place] about 61yrs ago. She notes that she was treated with a left sided lumpectomy, SLNB, adjuvant chemotherapy and RT and tamoxifen which she took only for 1 yr. -no  current evidence of breast cancer recurrence.  RTC in one week after first cycle day 1 gemcitabine plus Abraxane with Dr Irene Limbo with cbc, cmp.  I spent 25 minutes counseling the patient face to face. The total time spent in the appointment was 30 minutes and more than 50% was on counseling and direct patient cares.    Sullivan Lone MD [place] AAHIVMS [facility] Hematology/Oncology Physician [employer]  (Office):       [phone] (Work cell):  [phone] (Fax):           [phone]

## 2015-04-18 NOTE — H&P (Signed)
Chief Complaint: stage IV pancreatic cancer needing chemotherapy Referring Physician:Dr. Sullivan Lone HPI: Tammy Schmitt is an 75 y.o. female who underwent a diagnostic laparoscopy about a week ago for pancreatic cancer.  She was noted to have peritoneal implants and a whipple was aborted. She has since followed up with her oncologist who would like to start chemotherapy.  She has been referred to IR for [facility] placement.  She presents today for this procedure.  Past Medical History:  Past Medical History  Diagnosis Date  . Cataract   . GERD (gastroesophageal reflux disease)   . Myocardial infarction ([place]) 2001  . CAD (coronary artery disease)   . HLD (hyperlipidemia)   . Diverticulitis   . Colon polyp   . Hypertension   . Hemorrhoids     external  . Obesity   . DDD (degenerative disc disease), lumbar   . Prolapsed uterus   . Hyperlipidemia   . Mitral regurgitation   . Colon polyp   . Family history of adverse reaction to anesthesia     maternal aunt came out of surgery not knowing what happened and where she was  . COPD (chronic obstructive pulmonary disease) ([place])     Patient denies  . Cancer ([place]) 2000    breast  . Breast cancer ([place]) 2001    Past Surgical History:  Past Surgical History  Procedure Laterality Date  . Breast surgery Left     lymph nodes removed also  . Eye surgery Bilateral     cataracts removed  . Ovary surgery      one  tube removed , one ovary trimmed down  . Heart bypass      x 2, stent  . Coronary artery bypass graft  2001    x 2  . Cardiac catheterization  2001  . Eus N/A 03/08/2015    Procedure: UPPER ENDOSCOPIC ULTRASOUND (EUS) LINEAR;  Surgeon: Milus Banister, MD;  Location: [facility] END[facility];  Service: Endoscopy;  Laterality: N/A;  radial linear  . Colonoscopy    . Laparoscopy N/A 04/11/2015    Procedure: LAPAROSCOPY DIAGNOSTIC;  Surgeon: Stark Klein, MD;  Location: [facility] OR;  Service: General;  Laterality: N/A;    Family History:    Family History  Problem Relation Age of Onset  . Breast cancer Mother 38  . Heart disease Father   . Heart disease Brother   . Esophageal cancer Daughter 7    died at 67  . Leukemia Son 71  . Colon cancer Neg Hx   . Stomach cancer Neg Hx     Social History:  reports that she quit smoking about 15 years ago. Her smoking use included Cigarettes. She quit after 45 years of use. She has never used smokeless tobacco. She reports that she does not drink alcohol or use illicit drugs.  Allergies:  Allergies  Allergen Reactions  . Statins Other (See Comments)    Muscle pain    . Ace Inhibitors     From Previous Records  . Codeine Other (See Comments)    Doesn't feel well   . Levaquin [Levofloxacin In D5w]     From previous records    Medications:   Medication List    ASK your doctor about these medications        acetaminophen 500 MG tablet  Commonly known as:  TYLENOL  Take 1,000 mg by mouth 2 (two) times daily as needed for headache.     aspirin EC 81  MG tablet  Take 1 tablet (81 mg total) by mouth daily.     budesonide-formoterol 160-4.5 MCG/ACT inhaler  Commonly known as:  SYMBICORT  Inhale 2 puffs into the lungs 2 (two) times daily as needed (for bronchitis). Reported on 03/19/2015     fentaNYL 25 MCG/HR patch  Commonly known as:  DURAGESIC - dosed mcg/hr  Place 1 patch (25 mcg total) onto the skin every 3 (three) days.     HYDROmorphone 2 MG tablet  Commonly known as:  DILAUDID  Take 1 tablet (2 mg total) by mouth every 4 (four) hours as needed for severe pain.     lidocaine-prilocaine cream  Commonly known as:  EMLA  Apply to affected area once     LORazepam 0.5 MG tablet  Commonly known as:  ATIVAN  Take 1 tablet (0.5 mg total) by mouth every 6 (six) hours as needed (Nausea or vomiting).     metoprolol tartrate 25 MG tablet  Commonly known as:  LOPRESSOR  Take 0.5 tablets (12.5 mg total) by mouth 2 (two) times daily.     nitroGLYCERIN 0.4 MG SL tablet   Commonly known as:  NITROSTAT  Place 0.4 mg under the tongue every 5 (five) minutes as needed for chest pain. Reported on 03/19/2015     omeprazole 40 MG capsule  Commonly known as:  PRILOSEC  Take 1 capsule (40 mg total) by mouth 2 (two) times daily.     ondansetron 8 MG tablet  Commonly known as:  ZOFRAN  Take 1 tablet (8 mg total) by mouth 2 (two) times daily as needed (Nausea or vomiting).     Pancrelipase (Lip-Prot-Amyl) 24000 units Cpep  Take 1 capsule (24,000 Units total) by mouth 3 (three) times daily with meals.     prochlorperazine 10 MG tablet  Commonly known as:  COMPAZINE  Take 1 tablet (10 mg total) by mouth every 6 (six) hours as needed (Nausea or vomiting).     REFRESH OP  Apply 1 drop to eye daily as needed (for dry eyes).     senna-docusate 8.6-50 MG tablet  Commonly known as:  SENNA S  Take 2 tablets by mouth 2 (two) times daily.     SYSTANE OP  Apply 1 drop to eye daily as needed (for dry eyes).        Please HPI for pertinent positives, otherwise complete 10 system ROS negative, except for some abdominal pain and back pain.  Mallampati Score: MD Evaluation Airway: WNL Heart: WNL Abdomen: WNL Chest/ Lungs: WNL ASA  Classification: 3 Mallampati/Airway Score: One  Physical Exam: BP 118/62 mmHg  Pulse 101  Temp(Src) 98.6 F (37 C) (Oral)  Resp 18  SpO2 94% There is no weight on file to calculate BMI. General: pleasant, WD, WN white female who is laying in bed in NAD HEENT: head is normocephalic, atraumatic.  Sclera are noninjected.  PERRL.  Ears and nose without any masses or lesions.  Mouth is pink and moist Heart: regular, rate, and rhythm.  Normal s1,s2. No obvious murmurs, gallops, or rubs noted.  Palpable radial and pedal pulses bilaterally Lungs: CTAB, no wheezes, rhonchi, or rales noted.  Respiratory effort nonlabored Abd: soft, mild diffuse tenderness, ND, +BS, no masses, hernias, or organomegaly MS: all 4 extremities are symmetrical  with no cyanosis, clubbing, or edema. Psych: A&Ox3 with an appropriate affect.   Labs: Results for orders placed or performed during the hospital encounter of 04/18/15 (from the past 48 hour(s))  Basic  metabolic panel     Status: Abnormal   Collection Time: 04/18/15  1:00 PM  Result Value Ref Range   Sodium 131 (L) 135 - 145 mmol/L   Potassium 3.9 3.5 - 5.1 mmol/L   Chloride 92 (L) 101 - 111 mmol/L   CO2 27 22 - 32 mmol/L   Glucose, Bld 98 65 - 99 mg/dL   BUN 13 6 - 20 mg/dL   Creatinine, Ser 0.66 0.44 - 1.00 mg/dL   Calcium 8.4 (L) 8.9 - 10.3 mg/dL   GFR calc non Af Amer >60 >60 mL/min   GFR calc Af Amer >60 >60 mL/min    Comment: (NOTE) The eGFR has been calculated using the CKD EPI equation. This calculation has not been validated in all clinical situations. eGFR's persistently <60 mL/min signify possible Chronic Kidney Disease.    Anion gap 12 5 - 15  CBC with Differential/Platelet     Status: Abnormal   Collection Time: 04/18/15  1:00 PM  Result Value Ref Range   WBC 15.1 (H) 4.0 - 10.5 K/uL   RBC 3.66 (L) 3.87 - 5.11 MIL/uL   Hemoglobin 9.5 (L) 12.0 - 15.0 g/dL   HCT 30.1 (L) 36.0 - 46.0 %   MCV 82.2 78.0 - 100.0 fL   MCH 26.0 26.0 - 34.0 pg   MCHC 31.6 30.0 - 36.0 g/dL   RDW 14.1 11.5 - 15.5 %   Platelets 413 (H) 150 - 400 K/uL   Neutrophils Relative % 77 %   Neutro Abs 11.6 (H) 1.7 - 7.7 K/uL   Lymphocytes Relative 8 %   Lymphs Abs 1.2 0.7 - 4.0 K/uL   Monocytes Relative 15 %   Monocytes Absolute 2.2 (H) 0.1 - 1.0 K/uL   Eosinophils Relative 0 %   Eosinophils Absolute 0.0 0.0 - 0.7 K/uL   Basophils Relative 0 %   Basophils Absolute 0.0 0.0 - 0.1 K/uL  Protime-INR     Status: None   Collection Time: 04/18/15  1:00 PM  Result Value Ref Range   Prothrombin Time 14.5 11.6 - 15.2 seconds   INR 1.16 0.00 - 1.49    Imaging: No results found.  Assessment/Plan 1. Stage IV pancreatic cancer, need PAC placement -will plan to proceed with PAC placement  today. -labs and vitals have all been reviewed. -her WBC is 15K today, but he last 4 cbc have revealed the same value for her WBCs.  She denies any infectious symptoms, such as fevers, cough, chills, dysuria, etc. -Risks and Benefits discussed with the patient including, but not limited to bleeding, infection, pneumothorax, or fibrin sheath development and need for additional procedures. All of the patient's questions were answered, patient is agreeable to proceed. Consent signed and in chart.   Thank you for this interesting consult.  I greatly enjoyed meeting CRISOL MUECKE and look forward to participating in their care.  A copy of this report was sent to the requesting provider on this date.  Electronically Signed: Henreitta Cea 04/18/2015, 1:36 PM   I spent a total of  30 Minutes   in face to face in clinical consultation, greater than 50% of which was counseling/coordinating care for stage IV pancreatic cancer, needs PAC for chemo

## 2015-04-19 ENCOUNTER — Other Ambulatory Visit (HOSPITAL_BASED_OUTPATIENT_CLINIC_OR_DEPARTMENT_OTHER): Payer: Medicare HMO

## 2015-04-19 ENCOUNTER — Other Ambulatory Visit: Payer: Self-pay | Admitting: *Deleted

## 2015-04-19 ENCOUNTER — Ambulatory Visit: Payer: Medicare HMO | Admitting: Hematology

## 2015-04-19 ENCOUNTER — Encounter: Payer: Self-pay | Admitting: *Deleted

## 2015-04-19 ENCOUNTER — Ambulatory Visit (HOSPITAL_BASED_OUTPATIENT_CLINIC_OR_DEPARTMENT_OTHER): Payer: Medicare HMO

## 2015-04-19 VITALS — BP 112/51 | HR 110 | Temp 98.6°F | Resp 18

## 2015-04-19 DIAGNOSIS — Z5111 Encounter for antineoplastic chemotherapy: Secondary | ICD-10-CM

## 2015-04-19 DIAGNOSIS — C259 Malignant neoplasm of pancreas, unspecified: Secondary | ICD-10-CM

## 2015-04-19 DIAGNOSIS — C786 Secondary malignant neoplasm of retroperitoneum and peritoneum: Secondary | ICD-10-CM

## 2015-04-19 LAB — CBC WITH DIFFERENTIAL/PLATELET
BASO%: 0.6 % (ref 0.0–2.0)
Basophils Absolute: 0.1 10*3/uL (ref 0.0–0.1)
EOS%: 0.3 % (ref 0.0–7.0)
Eosinophils Absolute: 0 10*3/uL (ref 0.0–0.5)
HEMATOCRIT: 29.4 % — AB (ref 34.8–46.6)
HGB: 9.5 g/dL — ABNORMAL LOW (ref 11.6–15.9)
LYMPH#: 1.2 10*3/uL (ref 0.9–3.3)
LYMPH%: 9.6 % — AB (ref 14.0–49.7)
MCH: 25.6 pg (ref 25.1–34.0)
MCHC: 32.2 g/dL (ref 31.5–36.0)
MCV: 79.3 fL — ABNORMAL LOW (ref 79.5–101.0)
MONO#: 1.7 10*3/uL — ABNORMAL HIGH (ref 0.1–0.9)
MONO%: 14.4 % — AB (ref 0.0–14.0)
NEUT#: 9 10*3/uL — ABNORMAL HIGH (ref 1.5–6.5)
NEUT%: 75.1 % (ref 38.4–76.8)
Platelets: 393 10*3/uL (ref 145–400)
RBC: 3.71 10*6/uL (ref 3.70–5.45)
RDW: 14.5 % (ref 11.2–14.5)
WBC: 12 10*3/uL — ABNORMAL HIGH (ref 3.9–10.3)

## 2015-04-19 LAB — COMPREHENSIVE METABOLIC PANEL
AST: 22 U/L (ref 5–34)
Albumin: 2.1 g/dL — ABNORMAL LOW (ref 3.5–5.0)
Alkaline Phosphatase: 88 U/L (ref 40–150)
Anion Gap: 14 mEq/L — ABNORMAL HIGH (ref 3–11)
BUN: 10.1 mg/dL (ref 7.0–26.0)
CHLORIDE: 93 meq/L — AB (ref 98–109)
CO2: 26 meq/L (ref 22–29)
CREATININE: 0.7 mg/dL (ref 0.6–1.1)
Calcium: 8.8 mg/dL (ref 8.4–10.4)
EGFR: 87 mL/min/{1.73_m2} — ABNORMAL LOW (ref >90)
GLUCOSE: 105 mg/dL (ref 70–140)
POTASSIUM: 3.6 meq/L (ref 3.5–5.1)
SODIUM: 133 meq/L — AB (ref 136–145)
Total Bilirubin: 0.33 mg/dL (ref 0.20–1.20)
Total Protein: 6.1 g/dL — ABNORMAL LOW (ref 6.4–8.3)

## 2015-04-19 LAB — TECHNOLOGIST REVIEW

## 2015-04-19 MED ORDER — HEPARIN SOD (PORK) LOCK FLUSH 100 UNIT/ML IV SOLN
500.0000 [IU] | Freq: Once | INTRAVENOUS | Status: AC | PRN
  Administered 2015-04-19: 500 [IU]
  Filled 2015-04-19: qty 5

## 2015-04-19 MED ORDER — SODIUM CHLORIDE 0.9 % IV SOLN
1000.0000 mg/m2 | Freq: Once | INTRAVENOUS | Status: AC
  Administered 2015-04-19: 1634 mg via INTRAVENOUS
  Filled 2015-04-19: qty 42.98

## 2015-04-19 MED ORDER — SODIUM CHLORIDE 0.9 % IV SOLN
10.0000 mg | Freq: Once | INTRAVENOUS | Status: AC
  Administered 2015-04-19: 10 mg via INTRAVENOUS
  Filled 2015-04-19: qty 1

## 2015-04-19 MED ORDER — SODIUM CHLORIDE 0.9% FLUSH
10.0000 mL | INTRAVENOUS | Status: DC | PRN
  Administered 2015-04-19: 10 mL
  Filled 2015-04-19: qty 10

## 2015-04-19 MED ORDER — PACLITAXEL PROTEIN-BOUND CHEMO INJECTION 100 MG
100.0000 mg/m2 | Freq: Once | INTRAVENOUS | Status: AC
  Administered 2015-04-19: 175 mg via INTRAVENOUS
  Filled 2015-04-19: qty 35

## 2015-04-19 MED ORDER — PALONOSETRON HCL INJECTION 0.25 MG/5ML
0.2500 mg | Freq: Once | INTRAVENOUS | Status: AC
  Administered 2015-04-19: 0.25 mg via INTRAVENOUS

## 2015-04-19 MED ORDER — SODIUM CHLORIDE 0.9 % IV SOLN
Freq: Once | INTRAVENOUS | Status: AC
  Administered 2015-04-19: 10:00:00 via INTRAVENOUS

## 2015-04-19 MED ORDER — HYDROMORPHONE HCL 4 MG/ML IJ SOLN
INTRAMUSCULAR | Status: AC
  Filled 2015-04-19: qty 1

## 2015-04-19 MED ORDER — PALONOSETRON HCL INJECTION 0.25 MG/5ML
INTRAVENOUS | Status: AC
  Filled 2015-04-19: qty 5

## 2015-04-19 MED ORDER — HYDROMORPHONE HCL 4 MG/ML IJ SOLN
2.0000 mg | Freq: Once | INTRAMUSCULAR | Status: AC
  Administered 2015-04-19: 2 mg via INTRAVENOUS

## 2015-04-19 NOTE — Progress Notes (Signed)
Oncology Nurse Navigator Documentation  Oncology Nurse Navigator Flowsheets 04/19/2015  Navigator Location [facility]  Navigator Encounter Type Treatment  Telephone -  Abnormal Finding Date -  Treatment Initiated Date 04/19/2015  Patient Visit Type MedOnc  Treatment Phase First Chemo Tx--Gemzar/Abraxane  Barriers/Navigation Needs Coordination of Care;Education  Education Pain/ Symptom Management--pain; low grade temp with Gemzar  Interventions Coordination of Care;Referrals--[facility]  Referrals Home Health  Coordination of Care Other--Faxed demographics and order for DME to [facility] bed w/overbed table)  Support Groups/Services -  Acuity -  Time Spent with Patient 30  Met with patient during initial chemotherapy treatment to provide support and assess for needs to promote continuity of care She tells RN that she wishes she had brought her pain med with her-informed treatment nurse of her need for pain med and order obtained from Dr. Irene Limbo. She asks for RN to go ahead and get her hospital bed ordered for her. Hoping she will do well with the chemo, and adds that she is only doing this for her son. Said "if it was up to me, I wouldn't do it". Reminded her that it is ultimately her decision and we will support whatever that is.  Merceda Elks, RN, BSN GI Oncology [place]

## 2015-04-19 NOTE — Patient Instructions (Signed)
[facility] Discharge Instructions for Patients Receiving Chemotherapy  Today you received the following chemotherapy agents Abraxane/ Gemzar  To help prevent nausea and vomiting after your treatment, we encourage you to take your nausea medication as directed by your doctor.    If you develop nausea and vomiting that is not controlled by your nausea medication, call the clinic.   BELOW ARE SYMPTOMS THAT SHOULD BE REPORTED IMMEDIATELY:  *FEVER GREATER THAN 100.5 F  *CHILLS WITH OR WITHOUT FEVER  NAUSEA AND VOMITING THAT IS NOT CONTROLLED WITH YOUR NAUSEA MEDICATION  *UNUSUAL SHORTNESS OF BREATH  *UNUSUAL BRUISING OR BLEEDING  TENDERNESS IN MOUTH AND THROAT WITH OR WITHOUT PRESENCE OF ULCERS  *URINARY PROBLEMS  *BOWEL PROBLEMS  UNUSUAL RASH Items with * indicate a potential emergency and should be followed up as soon as possible.  Feel free to call the clinic you have any questions or concerns. The clinic phone number is [phone].  Please show the [facility] at check-in to the Emergency Department and triage nurse.   Nanoparticle Albumin-Bound Paclitaxel injection Abraxane What is this medicine? NANOPARTICLE ALBUMIN-BOUND PACLITAXEL (Na no PAHR ti kuhl al BYOO muhn-bound PAK li TAX el) is a chemotherapy drug. It targets fast dividing cells, like cancer cells, and causes these cells to die. This medicine is used to treat advanced breast cancer and advanced lung cancer. This medicine may be used for other purposes; ask your health care provider or pharmacist if you have questions. What should I tell my health care provider before I take this medicine? They need to know if you have any of these conditions: -kidney disease -liver disease -low blood counts, like low platelets, red blood cells, or white blood cells -recent or ongoing radiation therapy -an unusual or allergic reaction to paclitaxel, albumin, other chemotherapy, other medicines, foods,  dyes, or preservatives -pregnant or trying to get pregnant -breast-feeding How should I use this medicine? This drug is given as an infusion into a vein. It is administered in a hospital or clinic by a specially trained health care professional. Talk to your pediatrician regarding the use of this medicine in children. Special care may be needed. Overdosage: If you think you have taken too much of this medicine contact a poison control center or emergency room at once. NOTE: This medicine is only for you. Do not share this medicine with others. What if I miss a dose? It is important not to miss your dose. Call your doctor or health care professional if you are unable to keep an appointment. What may interact with this medicine? -cyclosporine -diazepam -ketoconazole -medicines to increase blood counts like filgrastim, pegfilgrastim, sargramostim -other chemotherapy drugs like cisplatin, doxorubicin, epirubicin, etoposide, teniposide, vincristine -quinidine -testosterone -vaccines -verapamil Talk to your doctor or health care professional before taking any of these medicines: -acetaminophen -aspirin -ibuprofen -ketoprofen -naproxen This list may not describe all possible interactions. Give your health care provider a list of all the medicines, herbs, non-prescription drugs, or dietary supplements you use. Also tell them if you smoke, drink alcohol, or use illegal drugs. Some items may interact with your medicine. What should I watch for while using this medicine? Your condition will be monitored carefully while you are receiving this medicine. You will need important blood work done while you are taking this medicine. This drug may make you feel generally unwell. This is not uncommon, as chemotherapy can affect healthy cells as well as cancer cells. Report any side effects. Continue your course  of treatment even though you feel ill unless your doctor tells you to stop. In some cases, you  may be given additional medicines to help with side effects. Follow all directions for their use. Call your doctor or health care professional for advice if you get a fever, chills or sore throat, or other symptoms of a cold or flu. Do not treat yourself. This drug decreases your body's ability to fight infections. Try to avoid being around people who are sick. This medicine may increase your risk to bruise or bleed. Call your doctor or health care professional if you notice any unusual bleeding. Be careful brushing and flossing your teeth or using a toothpick because you may get an infection or bleed more easily. If you have any dental work done, tell your dentist you are receiving this medicine. Avoid taking products that contain aspirin, acetaminophen, ibuprofen, naproxen, or ketoprofen unless instructed by your doctor. These medicines may hide a fever. Do not become pregnant while taking this medicine. Women should inform their doctor if they wish to become pregnant or think they might be pregnant. There is a potential for serious side effects to an unborn child. Talk to your health care professional or pharmacist for more information. Do not breast-feed an infant while taking this medicine. Men are advised not to father a child while receiving this medicine. What side effects may I notice from receiving this medicine? Side effects that you should report to your doctor or health care professional as soon as possible: -allergic reactions like skin rash, itching or hives, swelling of the face, lips, or tongue -low blood counts - This drug may decrease the number of white blood cells, red blood cells and platelets. You may be at increased risk for infections and bleeding. -signs of infection - fever or chills, cough, sore throat, pain or difficulty passing urine -signs of decreased platelets or bleeding - bruising, pinpoint red spots on the skin, black, tarry stools, nosebleeds -signs of decreased red  blood cells - unusually weak or tired, fainting spells, lightheadedness -breathing problems -changes in vision -chest pain -high or low blood pressure -mouth sores -nausea and vomiting -pain, swelling, redness or irritation at the injection site -pain, tingling, numbness in the hands or feet -slow or irregular heartbeat -swelling of the ankle, feet, hands Side effects that usually do not require medical attention (report to your doctor or health care professional if they continue or are bothersome): -aches, pains -changes in the color of fingernails -diarrhea -hair loss -loss of appetite This list may not describe all possible side effects. Call your doctor for medical advice about side effects. You may report side effects to FDA at 1-800-FDA-1088. Where should I keep my medicine? This drug is given in a hospital or clinic and will not be stored at home. NOTE: This sheet is a summary. It may not cover all possible information. If you have questions about this medicine, talk to your doctor, pharmacist, or health care provider.    2016, Elsevier/Gold Standard. (2012-04-05 16:48:50)   Gemcitabine injection Gemzar What is this medicine? GEMCITABINE (jem SIT a been) is a chemotherapy drug. This medicine is used to treat many types of cancer like breast cancer, lung cancer, pancreatic cancer, and ovarian cancer. This medicine may be used for other purposes; ask your health care provider or pharmacist if you have questions. What should I tell my health care provider before I take this medicine? They need to know if you have any of these conditions: -  blood disorders -infection -kidney disease -liver disease -recent or ongoing radiation therapy -an unusual or allergic reaction to gemcitabine, other chemotherapy, other medicines, foods, dyes, or preservatives -pregnant or trying to get pregnant -breast-feeding How should I use this medicine? This drug is given as an infusion into a  vein. It is administered in a hospital or clinic by a specially trained health care professional. Talk to your pediatrician regarding the use of this medicine in children. Special care may be needed. Overdosage: If you think you have taken too much of this medicine contact a poison control center or emergency room at once. NOTE: This medicine is only for you. Do not share this medicine with others. What if I miss a dose? It is important not to miss your dose. Call your doctor or health care professional if you are unable to keep an appointment. What may interact with this medicine? -medicines to increase blood counts like filgrastim, pegfilgrastim, sargramostim -some other chemotherapy drugs like cisplatin -vaccines Talk to your doctor or health care professional before taking any of these medicines: -acetaminophen -aspirin -ibuprofen -ketoprofen -naproxen This list may not describe all possible interactions. Give your health care provider a list of all the medicines, herbs, non-prescription drugs, or dietary supplements you use. Also tell them if you smoke, drink alcohol, or use illegal drugs. Some items may interact with your medicine. What should I watch for while using this medicine? Visit your doctor for checks on your progress. This drug may make you feel generally unwell. This is not uncommon, as chemotherapy can affect healthy cells as well as cancer cells. Report any side effects. Continue your course of treatment even though you feel ill unless your doctor tells you to stop. In some cases, you may be given additional medicines to help with side effects. Follow all directions for their use. Call your doctor or health care professional for advice if you get a fever, chills or sore throat, or other symptoms of a cold or flu. Do not treat yourself. This drug decreases your body's ability to fight infections. Try to avoid being around people who are sick. This medicine may increase your  risk to bruise or bleed. Call your doctor or health care professional if you notice any unusual bleeding. Be careful brushing and flossing your teeth or using a toothpick because you may get an infection or bleed more easily. If you have any dental work done, tell your dentist you are receiving this medicine. Avoid taking products that contain aspirin, acetaminophen, ibuprofen, naproxen, or ketoprofen unless instructed by your doctor. These medicines may hide a fever. Women should inform their doctor if they wish to become pregnant or think they might be pregnant. There is a potential for serious side effects to an unborn child. Talk to your health care professional or pharmacist for more information. Do not breast-feed an infant while taking this medicine. What side effects may I notice from receiving this medicine? Side effects that you should report to your doctor or health care professional as soon as possible: -allergic reactions like skin rash, itching or hives, swelling of the face, lips, or tongue -low blood counts - this medicine may decrease the number of white blood cells, red blood cells and platelets. You may be at increased risk for infections and bleeding. -signs of infection - fever or chills, cough, sore throat, pain or difficulty passing urine -signs of decreased platelets or bleeding - bruising, pinpoint red spots on the skin, black, tarry stools, blood in  the urine -signs of decreased red blood cells - unusually weak or tired, fainting spells, lightheadedness -breathing problems -chest pain -mouth sores -nausea and vomiting -pain, swelling, redness at site where injected -pain, tingling, numbness in the hands or feet -stomach pain -swelling of ankles, feet, hands -unusual bleeding Side effects that usually do not require medical attention (report to your doctor or health care professional if they continue or are bothersome): -constipation -diarrhea -hair loss -loss of  appetite -stomach upset This list may not describe all possible side effects. Call your doctor for medical advice about side effects. You may report side effects to FDA at 1-800-FDA-1088. Where should I keep my medicine? This drug is given in a hospital or clinic and will not be stored at home. NOTE: This sheet is a summary. It may not cover all possible information. If you have questions about this medicine, talk to your doctor, pharmacist, or health care provider.    2016, Elsevier/Gold Standard. (2007-06-22 18:45:54)

## 2015-04-19 NOTE — Progress Notes (Signed)
Pt requesting for more pain medicine for her back. Took po dilaudid at home 2mg , but still has a lot of pain from sitting. Called Dr. Irene Limbo and obtained order for dilaudid IV (pt prefers). Will give when orders placed in [facility].  Pt states that her pain is much more controlled and that IV dilaudid really helped. Pt also tolerated first time Gemzar and Abraxane. Denies any complaints at this time. Pt and son are both aware of medications for nausea at home and precautions to take with chemotherapy. AVS and schedule printed out for pt.

## 2015-04-20 ENCOUNTER — Telehealth: Payer: Self-pay | Admitting: *Deleted

## 2015-04-20 ENCOUNTER — Telehealth: Payer: Self-pay | Admitting: Hematology

## 2015-04-20 NOTE — Telephone Encounter (Signed)
-----   Message from V[place], RN sent at 04/19/2015 12:26 PM EST ----- Regarding: dr. Irene Limbo fu 1st time chemo call Dr. Irene Limbo. First time Abraxane/ Gemzar. Tolerated chemo very well wo compliations. Follow up phone call needed. ty

## 2015-04-20 NOTE — Telephone Encounter (Signed)
S/w pt, gave appt for 3/2 @ 9.30am per pof 2/22.

## 2015-04-20 NOTE — Telephone Encounter (Signed)
Called patient for chemotherapy follow up. Patient has no complaints at this time. Instructed patient to call if issues arise. Patient verbalize understanding.

## 2015-04-26 ENCOUNTER — Encounter: Payer: Self-pay | Admitting: Hematology

## 2015-04-26 ENCOUNTER — Other Ambulatory Visit (HOSPITAL_BASED_OUTPATIENT_CLINIC_OR_DEPARTMENT_OTHER): Payer: Medicare HMO

## 2015-04-26 ENCOUNTER — Ambulatory Visit (HOSPITAL_BASED_OUTPATIENT_CLINIC_OR_DEPARTMENT_OTHER): Payer: Medicare HMO | Admitting: Hematology

## 2015-04-26 VITALS — BP 109/54 | HR 92 | Temp 98.6°F | Resp 18 | Wt 141.1 lb

## 2015-04-26 DIAGNOSIS — C786 Secondary malignant neoplasm of retroperitoneum and peritoneum: Secondary | ICD-10-CM | POA: Diagnosis not present

## 2015-04-26 DIAGNOSIS — C259 Malignant neoplasm of pancreas, unspecified: Secondary | ICD-10-CM

## 2015-04-26 DIAGNOSIS — K5901 Slow transit constipation: Secondary | ICD-10-CM

## 2015-04-26 DIAGNOSIS — K5900 Constipation, unspecified: Secondary | ICD-10-CM | POA: Insufficient documentation

## 2015-04-26 LAB — CBC WITH DIFFERENTIAL/PLATELET
BASO%: 0.1 % (ref 0.0–2.0)
Basophils Absolute: 0 10*3/uL (ref 0.0–0.1)
EOS ABS: 0 10*3/uL (ref 0.0–0.5)
EOS%: 0.1 % (ref 0.0–7.0)
HCT: 25.8 % — ABNORMAL LOW (ref 34.8–46.6)
HGB: 8.2 g/dL — ABNORMAL LOW (ref 11.6–15.9)
LYMPH%: 9.3 % — AB (ref 14.0–49.7)
MCH: 25.7 pg (ref 25.1–34.0)
MCHC: 31.8 g/dL (ref 31.5–36.0)
MCV: 80.9 fL (ref 79.5–101.0)
MONO#: 0.8 10*3/uL (ref 0.1–0.9)
MONO%: 8.8 % (ref 0.0–14.0)
NEUT#: 7.3 10*3/uL — ABNORMAL HIGH (ref 1.5–6.5)
NEUT%: 81.7 % — AB (ref 38.4–76.8)
PLATELETS: 249 10*3/uL (ref 145–400)
RBC: 3.19 10*6/uL — AB (ref 3.70–5.45)
RDW: 14.7 % — ABNORMAL HIGH (ref 11.2–14.5)
WBC: 8.9 10*3/uL (ref 3.9–10.3)
lymph#: 0.8 10*3/uL — ABNORMAL LOW (ref 0.9–3.3)

## 2015-04-26 LAB — COMPREHENSIVE METABOLIC PANEL
ALT: 13 U/L (ref 0–55)
ANION GAP: 14 meq/L — AB (ref 3–11)
AST: 34 U/L (ref 5–34)
Albumin: 2.3 g/dL — ABNORMAL LOW (ref 3.5–5.0)
Alkaline Phosphatase: 98 U/L (ref 40–150)
BILIRUBIN TOTAL: 0.38 mg/dL (ref 0.20–1.20)
BUN: 9.5 mg/dL (ref 7.0–26.0)
CHLORIDE: 95 meq/L — AB (ref 98–109)
CO2: 25 meq/L (ref 22–29)
Calcium: 8.7 mg/dL (ref 8.4–10.4)
Creatinine: 0.6 mg/dL (ref 0.6–1.1)
EGFR: 89 mL/min/{1.73_m2} — AB (ref >90)
Glucose: 102 mg/dl (ref 70–140)
POTASSIUM: 3.5 meq/L (ref 3.5–5.1)
SODIUM: 134 meq/L — AB (ref 136–145)
TOTAL PROTEIN: 6.1 g/dL — AB (ref 6.4–8.3)

## 2015-04-26 MED ORDER — DIBUCAINE 1 % EX OINT: TOPICAL_OINTMENT | Freq: Three times a day (TID) | CUTANEOUS | Status: DC | PRN

## 2015-04-26 NOTE — Progress Notes (Signed)
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 She notes no acute new concerns. Grade 1 fatigue. Decreased oral intake. Notes that she has been eating better since yesterday. No significant nausea or vomiting. Had some issues with constipation and has increased her senna use and had MiraLAX added by the palliative care team. Notes some discomfort from her hemorrhoids and would like to have something for this. Was given a prescription for dibucaine.  Labs showed some mild anemia with hemoglobin of 8.3. No chest pain. No acute shortness of breath. No recent syncope or dizziness. She was recommended to call us if she has any of these symptoms so that we might help her get a blood transfusion. Blood pressure slightly low but asymptomatic. She notes that her cardiologist is cutdown on her blood pressure medications recently. Recommended increase oral intake and increasing her salt intake some as well. Currently on very low-dose beta blockers alone. No fevers or chills. Her pancreatic cancer related pain has been very well controlled at this time.  MEDICAL HISTORY:  Past Medical History  Diagnosis Date  . Cataract   . GERD (gastroesophageal reflux disease)   . Myocardial infarction ([place]) 2001  . CAD (coronary artery disease)   . HLD (hyperlipidemia)   . Diverticulitis   . Colon polyp   . Hypertension   . Hemorrhoids     external  . Obesity   . DDD (degenerative disc disease), lumbar   . Prolapsed uterus   . Hyperlipidemia   .  Mitral regurgitation   . Colon polyp   . Family history of adverse reaction to anesthesia     maternal aunt came out of surgery not knowing what happened and where she was  . COPD (chronic obstructive pulmonary disease) ([place])     Patient denies  . Cancer ([place]) 2000    breast  . Breast cancer ([place]) 2001    SURGICAL HISTORY: Past Surgical History  Procedure Laterality Date  . Breast surgery Left     lymph nodes removed also  . Eye surgery Bilateral     cataracts removed  . Ovary surgery      one  tube removed , one ovary trimmed down  . Heart bypass      x 2, stent  . Coronary artery bypass graft  2001    x 2  . Cardiac catheterization  2001  . Eus N/A 03/08/2015    Procedure: UPPER ENDOSCOPIC ULTRASOUND (EUS) LINEAR;  Surgeon: Milus Banister, MD;  Location: [facility];  Service: Endoscopy;  Laterality: N/A;  radial linear  . Colonoscopy    . Laparoscopy N/A 04/11/2015    Procedure: LAPAROSCOPY DIAGNOSTIC;  Surgeon: Stark Klein, MD;  Location: [facility];  Service: General;  Laterality: N/A;    SOCIAL HISTORY: Social History   Social History  . Marital Status: Divorced    Spouse Name: N/A  . Number of Children: 3  . Years of Education: N/A   Occupational History  . retired    Social History Main Topics  . Smoking status:  Former Smoker -- 45 years    Types: Cigarettes    Quit date: 12/26/1999  . Smokeless tobacco: Never Used  . Alcohol Use: No  . Drug Use: No  . Sexual Activity: Not on file   Other Topics Concern  . Not on file   Social History Narrative   Retired from working with disabled individuals    Two sons and one daughter (daughter and one son have [place]   Divorced, lives alone; moved to [place] from [place] May 2016   She likes to Psychologist, occupational and exercise.     FAMILY HISTORY: Family History  Problem Relation Age of Onset  . Breast cancer Mother 51  . Heart disease Father   . Heart disease Brother   . Esophageal cancer Daughter 73    died at 17  .  Leukemia Son 64  . Colon cancer Neg Hx   . Stomach cancer Neg Hx     ALLERGIES:  is allergic to statins; ace inhibitors; codeine; and levaquin.  MEDICATIONS:  Current Outpatient Prescriptions  Medication Sig Dispense Refill  . acetaminophen (TYLENOL) 500 MG tablet Take 1,000 mg by mouth 2 (two) times daily as needed for headache.     Tammy Schmitt aspirin EC 81 MG tablet Take 1 tablet (81 mg total) by mouth daily.    . budesonide-formoterol (SYMBICORT) 160-4.5 MCG/ACT inhaler Inhale 2 puffs into the lungs 2 (two) times daily as needed (for bronchitis). Reported on 03/19/2015    . dibucaine (NUPERCAINAL) 1 % ointment Apply topically 3 (three) times daily as needed for pain (for hemorrhoidal discomfort.). 30 g 0  . fentaNYL (DURAGESIC - DOSED MCG/HR) 25 MCG/HR patch Place 1 patch (25 mcg total) onto the skin every 3 (three) days. 10 patch 0  . HYDROmorphone (DILAUDID) 2 MG tablet Take 1 tablet (2 mg total) by mouth every 4 (four) hours as needed for severe pain. 120 tablet 0  . lidocaine-prilocaine (EMLA) cream Apply to affected area once 30 g 3  . LORazepam (ATIVAN) 0.5 MG tablet Take 1 tablet (0.5 mg total) by mouth every 6 (six) hours as needed (Nausea or vomiting). 30 tablet 0  . metoprolol tartrate (LOPRESSOR) 25 MG tablet Take 0.5 tablets (12.5 mg total) by mouth 2 (two) times daily. 90 tablet 3  . nitroGLYCERIN (NITROSTAT) 0.4 MG SL tablet Place 0.4 mg under the tongue every 5 (five) minutes as needed for chest pain. Reported on 03/19/2015    . omeprazole (PRILOSEC) 40 MG capsule Take 1 capsule (40 mg total) by mouth 2 (two) times daily. 60 capsule 11  . ondansetron (ZOFRAN) 8 MG tablet Take 1 tablet (8 mg total) by mouth 2 (two) times daily as needed (Nausea or vomiting). 30 tablet 1  . Pancrelipase, Lip-Prot-Amyl, 24000 units CPEP Take 1 capsule (24,000 Units total) by mouth 3 (three) times daily with meals. 180 capsule 1  . Polyethyl Glycol-Propyl Glycol (SYSTANE OP) Apply 1 drop to eye daily as  needed (for dry eyes).    . Polyvinyl Alcohol-Povidone (REFRESH OP) Apply 1 drop to eye daily as needed (for dry eyes).    . prochlorperazine (COMPAZINE) 10 MG tablet Take 1 tablet (10 mg total) by mouth every 6 (six) hours as needed (Nausea or vomiting). 30 tablet 1  . senna-docusate (SENNA S) 8.6-50 MG tablet Take 2 tablets by mouth 2 (two) times daily. 120 tablet 3   No current facility-administered medications for this visit.    REVIEW OF SYSTEMS:    10 Point review  of Systems was done is negative except as noted above.  PHYSICAL EXAMINATION: ECOG PERFORMANCE STATUS: 1 - Symptomatic but completely ambulatory  . Filed Vitals:   04/26/15 0948  BP: 109/54  Pulse: 92  Temp: 98.6 F (37 C)  Resp: 18   Filed Weights   04/26/15 0948  Weight: 141 lb 1.6 oz (64.003 kg)   .Body mass index is 29.5 kg/(m^2).  GENERAL:alert, mild distress due to pain and uncertainty of diagnosis. SKIN: skin color, texture, turgor are normal, no rashes or significant lesions EYES: normal, conjunctiva are pink and non-injected, sclera clear OROPHARYNX:no exudate, no erythema and lips, buccal mucosa, and tongue normal  NECK: supple, no JVD, thyroid normal size, non-tender, without nodularity LYMPH:  no palpable lymphadenopathy in the cervical, axillary or inguinal LUNGS: clear to auscultation with normal respiratory effort HEART: regular rate & rhythm,  no murmurs and no lower extremity edema ABDOMEN: TTP epigastric regions, mild abdominal distention. normoactive BM , no rigidity or rebound. Healing laparoscopic surgical incisions. No open wounds. Musculoskeletal: no cyanosis of digits and no clubbing  PSYCH: alert & oriented x 3 with fluent speech NEURO: no focal motor/sensory deficits  LABORATORY DATA:  I have reviewed the data as listed  . CBC Latest Ref Rng 04/26/2015 04/19/2015 04/18/2015  WBC 3.9 - 10.3 10e3/uL 8.9 12.0(H) 15.1(H)  Hemoglobin 11.6 - 15.9 g/dL 8.2(L) 9.5(L) 9.5(L)  Hematocrit  34.8 - 46.6 % 25.8(L) 29.4(L) 30.1(L)  Platelets 145 - 400 10e3/uL 249 393 413(H)   . CBC    Component Value Date/Time   WBC 8.9 04/26/2015 0931   WBC 15.1* 04/18/2015 1300   WBC 7.7 11/15/2014 1538   RBC 3.19* 04/26/2015 0931   RBC 3.66* 04/18/2015 1300   RBC 5.07 11/15/2014 1538   HGB 8.2* 04/26/2015 0931   HGB 9.5* 04/18/2015 1300   HGB 14.3 11/15/2014 1538   HCT 25.8* 04/26/2015 0931   HCT 30.1* 04/18/2015 1300   HCT 44.2 11/15/2014 1538   PLT 249 04/26/2015 0931   PLT 413* 04/18/2015 1300   MCV 80.9 04/26/2015 0931   MCV 82.2 04/18/2015 1300   MCV 87.2 11/15/2014 1538   MCH 25.7 04/26/2015 0931   MCH 26.0 04/18/2015 1300   MCH 28.1 11/15/2014 1538   MCHC 31.8 04/26/2015 0931   MCHC 31.6 04/18/2015 1300   MCHC 32.3 11/15/2014 1538   RDW 14.7* 04/26/2015 0931   RDW 14.1 04/18/2015 1300   LYMPHSABS 0.8* 04/26/2015 0931   LYMPHSABS 1.2 04/18/2015 1300   MONOABS 0.8 04/26/2015 0931   MONOABS 2.2* 04/18/2015 1300   EOSABS 0.0 04/26/2015 0931   EOSABS 0.0 04/18/2015 1300   BASOSABS 0.0 04/26/2015 0931   BASOSABS 0.0 04/18/2015 1300    CMP Latest Ref Rng 04/26/2015 04/19/2015 04/18/2015  Glucose 70 - 140 mg/dl 102 105 98  BUN 7.0 - 26.0 mg/dL 9.5 10.1 13  Creatinine 0.6 - 1.1 mg/dL 0.6 0.7 0.66  Sodium 136 - 145 mEq/L 134(L) 133(L) 131(L)  Potassium 3.5 - 5.1 mEq/L 3.5 3.6 3.9  Chloride 101 - 111 mmol/L - - 92(L)  CO2 22 - 29 mEq/L 25 26 27   Calcium 8.4 - 10.4 mg/dL 8.7 8.8 8.4(L)  Total Protein 6.4 - 8.3 g/dL 6.1(L) 6.1(L) -  Total Bilirubin 0.20 - 1.20 mg/dL 0.38 0.33 -  Alkaline Phos 40 - 150 U/L 98 88 -  AST 5 - 34 U/L 34 22 -  ALT 0 - 55 U/L 13 <9 -   Component  Latest Ref Rng 03/19/2015  CEA     0.0 - 4.7 ng/mL 2.1  Cancer Antigen (CA) 125     0.0 - 38.1 U/mL 481.7 (H)  CA 19-9     0 - 35 U/mL 953 (H)       RADIOGRAPHIC STUDIES: I have personally reviewed the radiological images as listed and agreed with the findings in the report. Nm Pet Image  Initial (pi) Skull Base To Thigh  03/29/2015  CLINICAL DATA:  Initial treatment strategy for pancreatic mass. History of left breast cancer 15 years ago with chemotherapy and radiation therapy. Biopsy of left ovary last week. EXAM: NUCLEAR MEDICINE PET SKULL BASE TO THIGH TECHNIQUE: 12.3 mCi F-18 FDG was injected intravenously. Full-ring PET imaging was performed from the skull base to thigh after the radiotracer. CT data was obtained and used for attenuation correction and anatomic localization. FASTING BLOOD GLUCOSE:  Value: 98 mg/dl COMPARISON:  Abdominal CT of 02/09/2015. FINDINGS: NECK No areas of abnormal hypermetabolism. CHEST Left suprahilar hypermetabolism along the pulmonary artery is favored to be vascular and is without correlate adenopathy. This measures a S.U.V. max of 3.4 including on image 75/ series 3. ABDOMEN/PELVIS Hypermetabolism corresponding to the soft tissue mass centered about the inferior most aspect of the uncinate process of the pancreas and adjacent transverse duodenum. This is not well visualized on today's CT, but measures a S.U.V. max of 11.6, including on image 139/ series 3. Extensive omental/ peritoneal metastasis. This is progressive since the prior diagnostic CT. An example omental nodule measures 1.2 cm and a S.U.V. max of 5.1 on image 122/series 3. More inferior omental nodule is new or significantly enlarged since the prior exam, measures 1.3 cm and a S.U.V. max of 5.9 on image 131/series 3. Innumerable omental nodules are identified concurrent with hypermetabolism more inferiorly, including on image 162/ series 3. Small bowel mesenteric nodules or nodes which are hypermetabolic. Example at 8 mm and a S.U.V. max of 4.5 on image 157/ series 3. Left ovarian mass demonstrates relatively low-level hypermetabolism. This measures 4.2 x 3.5 cm and a S.U.V. max of 3.5 on image 197/series 3. SKELETON No abnormal marrow activity. CT IMAGES PERFORMED FOR ATTENUATION CORRECTION Bilateral  carotid atherosclerosis.  No cervical adenopathy. Prior median sternotomy. Mild cardiomegaly. Moderate hiatal hernia. Trace right pleural fluid. Moderate centrilobular emphysema. Volume loss in inferior right upper lobe. Right lower lobe 7 mm nodule on image 82/series 3. Left lower lobe 6 mm nodule on image 79/series 3. Vague 4 mm left upper lobe pulmonary nodule. Development of moderate volume abdominal ascites. No bowel obstruction or other acute complication. Advanced abdominal aortic atherosclerosis. Pessary in place. IMPRESSION: 1. Hypermetabolism corresponding to ill-defined soft tissue mass arising either from the uncinate process of the pancreas or the transverse duodenum. This is consistent with primary malignancy. 2. Moderate to marked progression of widespread omental/peritoneal metastasis. Development of moderate volume ascites. Nodal metastasis within the small bowel mesentery. If successful tissue sampling has not been performed, laparoscopy may be necessary (presuming endoscopic ultrasound of the presumed primary is not possible). 3. Low-level hypermetabolism in the left ovary is favored to represent a a synchronous indolent and likely benign neoplasm, given the biopsy results suggesting a fibroma. 4. Bilateral lung nodules. Although technically indeterminate, suspicious for pulmonary metastasis. 5. Trace right pleural fluid. 6. Hiatal hernia. Electronically Signed   By: Abigail Miyamoto M.D.   On: 03/29/2015 12:15   Ir Fluoro Guide Cv Line Right  04/18/2015  CLINICAL DATA:  Pancreatic  cancer EXAM: TUNNEL POWER PORT PLACEMENT WITH SUBCUTANEOUS POCKET UTILIZING ULTRASOUND & FLOUROSCOPY FLUOROSCOPY TIME:  24 seconds MEDICATIONS AND MEDICAL HISTORY: Versed 1.5 mg, Fentanyl 50 mcg. Additional Medications: As antibiotic prophylaxis, Ancef was ordered pre-procedure and administered intravenously within one hour of incision. ANESTHESIA/SEDATION: Moderate sedation time: 25 minutes CONTRAST:  None PROCEDURE:  After written informed consent was obtained, patient was placed in the supine position on angiographic table. The right neck and chest was prepped and draped in a sterile fashion. Lidocaine was utilized for local anesthesia. The right jugular vein was noted to be patent initially with ultrasound. Under sonographic guidance, a micropuncture needle was inserted into the right IJ vein (Ultrasound and fluoroscopic image documentation was performed). The needle was removed over an 018 wire which was exchanged for a Amplatz. This was advanced into the IVC. An 8-French dilator was advanced over the Amplatz. A small incision was made in the right upper chest over the anterior right second rib. Utilizing blunt dissection, a subcutaneous pocket was created in the caudal direction. The pocket was irrigated with a copious amount of sterile normal saline. The port catheter was tunneled from the chest incision, and out the neck incision. The reservoir was inserted into the subcutaneous pocket and secured with two 3-0 Ethilon stitches. A peel-away sheath was advanced over the Amplatz wire. The port catheter was cut to measure length and inserted through the peel-away sheath. The peel-away sheath was removed. The chest incision was closed with 3-0 Vicryl interrupted stitches for the subcutaneous tissue and a running of 4-0 Vicryl subcuticular stitch for the skin. The neck incision was closed with a 4-0 Vicryl subcuticular stitch. Derma-bond was applied to both surgical incisions. The port reservoir was flushed and instilled with heparinized saline. No complications. FINDINGS: A right IJ vein Port-A-Cath is in place with its tip at the cavoatrial junction. COMPLICATIONS: None IMPRESSION: Successful 8 French right internal jugular vein power port placement with its tip at the SVC/RA junction. Electronically Signed   By: Marybelle Killings M.D.   On: 04/18/2015 16:22   Ir US Guide Vasc Access Right  04/18/2015  CLINICAL DATA:  Pancreatic  cancer EXAM: TUNNEL POWER PORT PLACEMENT WITH SUBCUTANEOUS POCKET UTILIZING ULTRASOUND & FLOUROSCOPY FLUOROSCOPY TIME:  24 seconds MEDICATIONS AND MEDICAL HISTORY: Versed 1.5 mg, Fentanyl 50 mcg. Additional Medications: As antibiotic prophylaxis, Ancef was ordered pre-procedure and administered intravenously within one hour of incision. ANESTHESIA/SEDATION: Moderate sedation time: 25 minutes CONTRAST:  None PROCEDURE: After written informed consent was obtained, patient was placed in the supine position on angiographic table. The right neck and chest was prepped and draped in a sterile fashion. Lidocaine was utilized for local anesthesia. The right jugular vein was noted to be patent initially with ultrasound. Under sonographic guidance, a micropuncture needle was inserted into the right IJ vein (Ultrasound and fluoroscopic image documentation was performed). The needle was removed over an 018 wire which was exchanged for a Amplatz. This was advanced into the IVC. An 8-French dilator was advanced over the Amplatz. A small incision was made in the right upper chest over the anterior right second rib. Utilizing blunt dissection, a subcutaneous pocket was created in the caudal direction. The pocket was irrigated with a copious amount of sterile normal saline. The port catheter was tunneled from the chest incision, and out the neck incision. The reservoir was inserted into the subcutaneous pocket and secured with two 3-0 Ethilon stitches. A peel-away sheath was advanced over the Amplatz wire. The port catheter  was cut to measure length and inserted through the peel-away sheath. The peel-away sheath was removed. The chest incision was closed with 3-0 Vicryl interrupted stitches for the subcutaneous tissue and a running of 4-0 Vicryl subcuticular stitch for the skin. The neck incision was closed with a 4-0 Vicryl subcuticular stitch. Derma-bond was applied to both surgical incisions. The port reservoir was flushed and  instilled with heparinized saline. No complications. FINDINGS: A right IJ vein Port-A-Cath is in place with its tip at the cavoatrial junction. COMPLICATIONS: None IMPRESSION: Successful 8 French right internal jugular vein power port placement with its tip at the SVC/RA junction. Electronically Signed   By: Marybelle Killings M.D.   On: 04/18/2015 16:22   Dg Chest Port 1 View  04/12/2015  CLINICAL DATA:  Oxygen desaturation EXAM: PORTABLE CHEST 1 VIEW COMPARISON:  03/29/2015 FINDINGS: Moderate to large hiatal hernia.  Mild cardiac enlargement. Mild left lower lobe atelectasis. Band of opacity right mid lung zone appears consistent with subsegmental atelectasis similar to prior study. IMPRESSION: Compare sent to CT scan, allowing for comparison of different modalities, shows no significant change with continued subsegmental atelectasis right mid lung zone. Electronically Signed   By: Skipper Cliche M.D.   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 EUS bx - atypical cells not definitive diagnosis of pancreatic adenocarcinoma -though this is the some likely diagnosis. CA 19-9 elevated. 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 laproscopic surgical biopsy provided definitive diagnosis of pancreatic adenocarcinoma. Plan -Patient tolerated day 1 cycle 1 of gemcitabine and Abraxane quite well with no prohibitive toxicities he did have some grade 1 fatigue. Grade 1 anorexia. -Blood count stable. -She will continue with this regimen on day 15 and will see me prior to this to follow-up her blood counts especially from a perspective of her anemia. -She has been  recommended to call us if her anemia gets more symptomatic. -Has been encouraged to eat and drink better and to ambulate at least 20 minutes a day. -We discussed her goals of care and to seek what is important for her to achieve.   2) Neoplasm related abdominal pain-well controlled at this time -Continue fentanyl patch and when necessary Dilaudid  3) Pancreatic exocrine deficiency -Continue Creon  #4 opiates related constipation-resolved with adjustment of laxatives. Constipation is also partly related to her peritoneal metastases and painful hemorrhoids. Plan -She has increased her Senokot to 3 tablets by mouth twice a day. -MiraLAX added by palliative care. -Given dibucaine ointment for painful hemorrhoids  4) significant h/o CAD on ASA -Continue beta blocker and aspirin as per cardiologist.  -continue f/u with PCP for continue cares of other medical issues.  4) h/o left breast hormone positive cancer 18 yrs ago at [facility] in [place] about 61yrs ago. She notes that she was treated with a left sided lumpectomy, SLNB, adjuvant chemotherapy and RT and tamoxifen which she took only for 1 yr. -no current evidence of breast cancer recurrence.  RTC in one week on cycle 1 day 15 of gemcitabine plus Abraxane with Dr Irene Limbo with cbc, cmp.   Sullivan Lone MD MS AAHIVMS [place]  Hematology/Oncology Physician [facility]  (Office):       [phone] (Work cell):  [phone] (Fax):           [phone]

## 2015-04-27 LAB — CANCER ANTIGEN 19-9

## 2015-04-30 ENCOUNTER — Inpatient Hospital Stay (HOSPITAL_COMMUNITY)
Admission: EM | Admit: 2015-04-30 | Discharge: 2015-05-05 | DRG: 871 | Disposition: A | Discharge status: Home Health | Payer: Medicare HMO | Attending: Internal Medicine | Admitting: Internal Medicine

## 2015-04-30 ENCOUNTER — Encounter (HOSPITAL_COMMUNITY): Payer: Self-pay | Admitting: Emergency Medicine

## 2015-04-30 ENCOUNTER — Emergency Department (HOSPITAL_COMMUNITY): Payer: Medicare HMO

## 2015-04-30 DIAGNOSIS — E43 Unspecified severe protein-calorie malnutrition: Secondary | ICD-10-CM | POA: Insufficient documentation

## 2015-04-30 DIAGNOSIS — I252 Old myocardial infarction: Secondary | ICD-10-CM

## 2015-04-30 DIAGNOSIS — D72829 Elevated white blood cell count, unspecified: Secondary | ICD-10-CM | POA: Diagnosis present

## 2015-04-30 DIAGNOSIS — Z800 Family history of malignant neoplasm of digestive organs: Secondary | ICD-10-CM

## 2015-04-30 DIAGNOSIS — D638 Anemia in other chronic diseases classified elsewhere: Secondary | ICD-10-CM | POA: Diagnosis present

## 2015-04-30 DIAGNOSIS — C259 Malignant neoplasm of pancreas, unspecified: Secondary | ICD-10-CM | POA: Diagnosis present

## 2015-04-30 DIAGNOSIS — R2681 Unsteadiness on feet: Secondary | ICD-10-CM | POA: Diagnosis present

## 2015-04-30 DIAGNOSIS — Z7982 Long term (current) use of aspirin: Secondary | ICD-10-CM

## 2015-04-30 DIAGNOSIS — E785 Hyperlipidemia, unspecified: Secondary | ICD-10-CM | POA: Diagnosis present

## 2015-04-30 DIAGNOSIS — I639 Cerebral infarction, unspecified: Secondary | ICD-10-CM

## 2015-04-30 DIAGNOSIS — J209 Acute bronchitis, unspecified: Secondary | ICD-10-CM | POA: Diagnosis present

## 2015-04-30 DIAGNOSIS — Z9981 Dependence on supplemental oxygen: Secondary | ICD-10-CM

## 2015-04-30 DIAGNOSIS — Z79899 Other long term (current) drug therapy: Secondary | ICD-10-CM

## 2015-04-30 DIAGNOSIS — J9601 Acute respiratory failure with hypoxia: Secondary | ICD-10-CM | POA: Diagnosis present

## 2015-04-30 DIAGNOSIS — I340 Nonrheumatic mitral (valve) insufficiency: Secondary | ICD-10-CM | POA: Diagnosis present

## 2015-04-30 DIAGNOSIS — Z951 Presence of aortocoronary bypass graft: Secondary | ICD-10-CM

## 2015-04-30 DIAGNOSIS — E669 Obesity, unspecified: Secondary | ICD-10-CM | POA: Diagnosis present

## 2015-04-30 DIAGNOSIS — I509 Heart failure, unspecified: Secondary | ICD-10-CM

## 2015-04-30 DIAGNOSIS — K219 Gastro-esophageal reflux disease without esophagitis: Secondary | ICD-10-CM | POA: Diagnosis present

## 2015-04-30 DIAGNOSIS — I429 Cardiomyopathy, unspecified: Secondary | ICD-10-CM | POA: Diagnosis present

## 2015-04-30 DIAGNOSIS — Z87891 Personal history of nicotine dependence: Secondary | ICD-10-CM

## 2015-04-30 DIAGNOSIS — A419 Sepsis, unspecified organism: Principal | ICD-10-CM | POA: Diagnosis present

## 2015-04-30 DIAGNOSIS — C786 Secondary malignant neoplasm of retroperitoneum and peritoneum: Secondary | ICD-10-CM | POA: Diagnosis present

## 2015-04-30 DIAGNOSIS — R6521 Severe sepsis with septic shock: Secondary | ICD-10-CM | POA: Diagnosis present

## 2015-04-30 DIAGNOSIS — Z853 Personal history of malignant neoplasm of breast: Secondary | ICD-10-CM

## 2015-04-30 DIAGNOSIS — R0602 Shortness of breath: Secondary | ICD-10-CM

## 2015-04-30 DIAGNOSIS — H5500 Unspecified nystagmus: Secondary | ICD-10-CM | POA: Diagnosis present

## 2015-04-30 DIAGNOSIS — Z885 Allergy status to narcotic agent status: Secondary | ICD-10-CM

## 2015-04-30 DIAGNOSIS — R42 Dizziness and giddiness: Secondary | ICD-10-CM | POA: Insufficient documentation

## 2015-04-30 DIAGNOSIS — D630 Anemia in neoplastic disease: Secondary | ICD-10-CM | POA: Diagnosis present

## 2015-04-30 DIAGNOSIS — R531 Weakness: Secondary | ICD-10-CM | POA: Diagnosis not present

## 2015-04-30 DIAGNOSIS — D6481 Anemia due to antineoplastic chemotherapy: Secondary | ICD-10-CM | POA: Diagnosis present

## 2015-04-30 DIAGNOSIS — N390 Urinary tract infection, site not specified: Secondary | ICD-10-CM | POA: Diagnosis present

## 2015-04-30 DIAGNOSIS — Z66 Do not resuscitate: Secondary | ICD-10-CM | POA: Diagnosis present

## 2015-04-30 DIAGNOSIS — E860 Dehydration: Secondary | ICD-10-CM | POA: Diagnosis present

## 2015-04-30 DIAGNOSIS — Z825 Family history of asthma and other chronic lower respiratory diseases: Secondary | ICD-10-CM

## 2015-04-30 DIAGNOSIS — Z86010 Personal history of colonic polyps: Secondary | ICD-10-CM

## 2015-04-30 DIAGNOSIS — Z888 Allergy status to other drugs, medicaments and biological substances status: Secondary | ICD-10-CM

## 2015-04-30 DIAGNOSIS — I10 Essential (primary) hypertension: Secondary | ICD-10-CM | POA: Diagnosis present

## 2015-04-30 DIAGNOSIS — Z806 Family history of leukemia: Secondary | ICD-10-CM

## 2015-04-30 DIAGNOSIS — E876 Hypokalemia: Secondary | ICD-10-CM | POA: Insufficient documentation

## 2015-04-30 DIAGNOSIS — T451X5A Adverse effect of antineoplastic and immunosuppressive drugs, initial encounter: Secondary | ICD-10-CM | POA: Diagnosis present

## 2015-04-30 DIAGNOSIS — D62 Acute posthemorrhagic anemia: Secondary | ICD-10-CM | POA: Diagnosis present

## 2015-04-30 DIAGNOSIS — Z7951 Long term (current) use of inhaled steroids: Secondary | ICD-10-CM

## 2015-04-30 DIAGNOSIS — Z881 Allergy status to other antibiotic agents status: Secondary | ICD-10-CM

## 2015-04-30 DIAGNOSIS — I2510 Atherosclerotic heart disease of native coronary artery without angina pectoris: Secondary | ICD-10-CM | POA: Diagnosis present

## 2015-04-30 DIAGNOSIS — Z515 Encounter for palliative care: Secondary | ICD-10-CM | POA: Diagnosis present

## 2015-04-30 DIAGNOSIS — Z6829 Body mass index (BMI) 29.0-29.9, adult: Secondary | ICD-10-CM

## 2015-04-30 DIAGNOSIS — Z8249 Family history of ischemic heart disease and other diseases of the circulatory system: Secondary | ICD-10-CM

## 2015-04-30 DIAGNOSIS — R188 Other ascites: Secondary | ICD-10-CM | POA: Diagnosis present

## 2015-04-30 DIAGNOSIS — J440 Chronic obstructive pulmonary disease with acute lower respiratory infection: Secondary | ICD-10-CM | POA: Diagnosis present

## 2015-04-30 DIAGNOSIS — Z803 Family history of malignant neoplasm of breast: Secondary | ICD-10-CM

## 2015-04-30 DIAGNOSIS — I5023 Acute on chronic systolic (congestive) heart failure: Secondary | ICD-10-CM | POA: Diagnosis present

## 2015-04-30 DIAGNOSIS — R6510 Systemic inflammatory response syndrome (SIRS) of non-infectious origin without acute organ dysfunction: Secondary | ICD-10-CM | POA: Insufficient documentation

## 2015-04-30 LAB — URINALYSIS, ROUTINE W REFLEX MICROSCOPIC
GLUCOSE, UA: NEGATIVE mg/dL
Ketones, ur: NEGATIVE mg/dL
NITRITE: NEGATIVE
PH: 6.5 (ref 5.0–8.0)
Protein, ur: 100 mg/dL — AB
SPECIFIC GRAVITY, URINE: 1.017 (ref 1.005–1.030)

## 2015-04-30 LAB — URINE MICROSCOPIC-ADD ON

## 2015-04-30 LAB — CBC WITH DIFFERENTIAL/PLATELET
BASOS ABS: 0 10*3/uL (ref 0.0–0.1)
Basophils Relative: 0 %
EOS PCT: 0 %
Eosinophils Absolute: 0 10*3/uL (ref 0.0–0.7)
HEMATOCRIT: 28.3 % — AB (ref 36.0–46.0)
HEMOGLOBIN: 8.6 g/dL — AB (ref 12.0–15.0)
LYMPHS PCT: 8 %
Lymphs Abs: 2 10*3/uL (ref 0.7–4.0)
MCH: 25.7 pg — AB (ref 26.0–34.0)
MCHC: 30.4 g/dL (ref 30.0–36.0)
MCV: 84.7 fL (ref 78.0–100.0)
MONOS PCT: 8 %
Monocytes Absolute: 2 10*3/uL — ABNORMAL HIGH (ref 0.1–1.0)
Neutro Abs: 21.3 10*3/uL — ABNORMAL HIGH (ref 1.7–7.7)
Neutrophils Relative %: 84 %
PLATELETS: 303 10*3/uL (ref 150–400)
RBC: 3.34 MIL/uL — AB (ref 3.87–5.11)
RDW: 17.8 % — ABNORMAL HIGH (ref 11.5–15.5)
WBC: 25.3 10*3/uL — AB (ref 4.0–10.5)

## 2015-04-30 LAB — COMPREHENSIVE METABOLIC PANEL
ALBUMIN: 2.5 g/dL — AB (ref 3.5–5.0)
ALK PHOS: 82 U/L (ref 38–126)
ALT: 11 U/L — AB (ref 14–54)
AST: 26 U/L (ref 15–41)
Anion gap: 13 (ref 5–15)
BUN: 13 mg/dL (ref 6–20)
CHLORIDE: 98 mmol/L — AB (ref 101–111)
CO2: 27 mmol/L (ref 22–32)
CREATININE: 0.81 mg/dL (ref 0.44–1.00)
Calcium: 8.1 mg/dL — ABNORMAL LOW (ref 8.9–10.3)
GFR calc non Af Amer: >60 mL/min (ref >60)
GLUCOSE: 107 mg/dL — AB (ref 65–99)
Potassium: 3.1 mmol/L — ABNORMAL LOW (ref 3.5–5.1)
SODIUM: 138 mmol/L (ref 135–145)
Total Bilirubin: 0.9 mg/dL (ref 0.3–1.2)
Total Protein: 6.2 g/dL — ABNORMAL LOW (ref 6.5–8.1)

## 2015-04-30 LAB — I-STAT CG4 LACTIC ACID, ED
LACTIC ACID, VENOUS: 1.25 mmol/L (ref 0.5–2.0)
Lactic Acid, Venous: 1.47 mmol/L (ref 0.5–2.0)
Lactic Acid, Venous: 0.7 mmol/L (ref 0.5–2.0)

## 2015-04-30 LAB — I-STAT TROPONIN, ED: TROPONIN I, POC: 0.08 ng/mL (ref 0.00–0.08)

## 2015-04-30 MED ORDER — VANCOMYCIN HCL 500 MG IV SOLR
500.0000 mg | Freq: Two times a day (BID) | INTRAVENOUS | Status: DC
  Administered 2015-05-01 – 2015-05-03 (×5): 500 mg via INTRAVENOUS
  Filled 2015-04-30 (×6): qty 500

## 2015-04-30 MED ORDER — LACTATED RINGERS IV BOLUS (SEPSIS)
2000.0000 mL | Freq: Once | INTRAVENOUS | Status: AC
  Administered 2015-04-30: 2000 mL via INTRAVENOUS

## 2015-04-30 MED ORDER — IOHEXOL 300 MG/ML  SOLN
100.0000 mL | Freq: Once | INTRAMUSCULAR | Status: AC | PRN
  Administered 2015-04-30: 100 mL via INTRAVENOUS

## 2015-04-30 MED ORDER — PIPERACILLIN-TAZOBACTAM 3.375 G IVPB 30 MIN
3.3750 g | Freq: Once | INTRAVENOUS | Status: AC
  Administered 2015-04-30: 3.375 g via INTRAVENOUS
  Filled 2015-04-30: qty 50

## 2015-04-30 MED ORDER — ACETAMINOPHEN 325 MG PO TABS
650.0000 mg | ORAL_TABLET | Freq: Once | ORAL | Status: AC | PRN
  Administered 2015-04-30: 650 mg via ORAL
  Filled 2015-04-30: qty 2

## 2015-04-30 MED ORDER — GI COCKTAIL ~~LOC~~: 30.0000 mL | Freq: Once | ORAL | Status: DC

## 2015-04-30 MED ORDER — SODIUM CHLORIDE 0.9 % IV BOLUS (SEPSIS)
1000.0000 mL | INTRAVENOUS | Status: AC
  Administered 2015-04-30 (×2): 1000 mL via INTRAVENOUS

## 2015-04-30 MED ORDER — VANCOMYCIN HCL IN DEXTROSE 1-5 GM/200ML-% IV SOLN
1000.0000 mg | Freq: Once | INTRAVENOUS | Status: AC
  Administered 2015-04-30: 1000 mg via INTRAVENOUS
  Filled 2015-04-30: qty 200

## 2015-04-30 MED ORDER — PIPERACILLIN-TAZOBACTAM 3.375 G IVPB
3.3750 g | Freq: Three times a day (TID) | INTRAVENOUS | Status: DC
  Administered 2015-05-01 – 2015-05-03 (×7): 3.375 g via INTRAVENOUS
  Filled 2015-04-30 (×6): qty 50

## 2015-04-30 NOTE — ED Notes (Addendum)
MD is aware of patient's vital signs. MD at bedside. Intensivist to see patient.

## 2015-04-30 NOTE — ED Notes (Signed)
MD made aware of patient's blood pressure. MD to bedside.

## 2015-04-30 NOTE — ED Notes (Signed)
Pt reports new onset fever and nausea this morning; last chemo treatment 3/2; pancreatic cancer.

## 2015-04-30 NOTE — ED Notes (Signed)
Nurse accessing port 

## 2015-04-30 NOTE — ED Notes (Signed)
Patient transported to CT 

## 2015-04-30 NOTE — Progress Notes (Signed)
Pharmacy Antibiotic Note  Tammy Schmitt is a 75 y.o. female with hx breast cancer, CAD with CABGx2 '01, and recent diagnosis of stage IV pancreatic cancer s/p undergoing first cycle of chemotherapy, last on 3/2, now admitted on 04/30/2015 with fever, tachycardia, and nausea.  Pharmacy has been consulted for Vancomycin and Zosyn dosing for sepsis. First doses ordered in ED.     3/6: Update: Labs resulted.  CrCl ~ 48 ml/min.  WBC elevated 25.3K.   Plan: Vanc 1g IV x 1 in ED, then 500mg  IV q12h Zosyn 3.375g IV q8h (infuse over 4 hours) F/u renal fxn, cultures, clinical course   Height: 4\' 10"  (147.3 cm) Weight: 141 lb (63.957 kg) IBW/kg (Calculated) : 40.9  Temp (24hrs), Avg:100.2 F (37.9 C), Min:100.2 F (37.9 C), Max:100.2 F (37.9 C)   Recent Labs Lab 04/26/15 0931 04/26/15 0932 04/30/15 1929  WBC 8.9  --   --   CREATININE  --  0.6  --   LATICACIDVEN  --   --  1.47    Estimated Creatinine Clearance: 48.8 mL/min (by C-G formula based on Cr of 0.6).    Allergies  Allergen Reactions  . Statins Other (See Comments)    Muscle pain    . Ace Inhibitors     From Previous Records  . Codeine Other (See Comments)    Doesn't feel well   . Levaquin [Levofloxacin In D5w]     From previous records    Antimicrobials this admission: 3/6 Vancomycin >>  3/6 Zosyn >>   Dose adjustments this admission:   Microbiology results: 3/6 BCx: collected 3/6 UCx: ordered   Thank you for allowing pharmacy to be a part of this patient's care.  Ralene Bathe, PharmD, BCPS 04/30/2015, 8:48 PM  Pager: [phone]

## 2015-04-30 NOTE — ED Notes (Signed)
MD at bedside. 

## 2015-04-30 NOTE — ED Provider Notes (Signed)
CSN: [id number]     Arrival date & time 04/30/15  1852 History   First MD Initiated Contact with Patient 04/30/15 1919     Chief Complaint  Patient presents with  . Fever  . Nausea  . Chemo Card      (Consider location/radiation/quality/duration/timing/severity/associated sxs/prior Treatment) HPI Complains of nausea sinceyesterday, no vomiting. Had temperature of 102 earlier today. No treatment prior to coming here. Other symptoms include ddiffuseabdominal pain which is chronic and under controlpresently.home mild cough. Nothing makes symptoms better or worse. No treatment prior to coming here.ppatient last had chemotherapy4 days ago Past Medical History  Diagnosis Date  . Cataract   . GERD (gastroesophageal reflux disease)   . Myocardial infarction ([place]) 2001  . CAD (coronary artery disease)   . HLD (hyperlipidemia)   . Diverticulitis   . Colon polyp   . Hypertension   . Hemorrhoids     external  . Obesity   . DDD (degenerative disc disease), lumbar   . Prolapsed uterus   . Hyperlipidemia   . Mitral regurgitation   . Colon polyp   . Family history of adverse reaction to anesthesia     maternal aunt came out of surgery not knowing what happened and where she was  . COPD (chronic obstructive pulmonary disease) ([place])     Patient denies  . Cancer ([place]) 2000    breast  . Breast cancer ([place]) 2001   Past Surgical History  Procedure Laterality Date  . Breast surgery Left     lymph nodes removed also  . Eye surgery Bilateral     cataracts removed  . Ovary surgery      one  tube removed , one ovary trimmed down  . Heart bypass      x 2, stent  . Coronary artery bypass graft  2001    x 2  . Cardiac catheterization  2001  . Eus N/A 03/08/2015    Procedure: UPPER ENDOSCOPIC ULTRASOUND (EUS) LINEAR;  Surgeon: Milus Banister, MD;  Location: [facility];  Service: Endoscopy;  Laterality: N/A;  radial linear  . Colonoscopy    . Laparoscopy N/A 04/11/2015    Procedure:  LAPAROSCOPY DIAGNOSTIC;  Surgeon: Stark Klein, MD;  Location: [facility] OR;  Service: General;  Laterality: N/A;   Family History  Problem Relation Age of Onset  . Breast cancer Mother 59  . Heart disease Father   . Heart disease Brother   . Esophageal cancer Daughter 66    died at 25  . Leukemia Son 33  . Colon cancer Neg Hx   . Stomach cancer Neg Hx    Social History  Substance Use Topics  . Smoking status: Former Smoker -- 45 years    Types: Cigarettes    Quit date: 12/26/1999  . Smokeless tobacco: Never Used  . Alcohol Use: No   OB History    No data available     Review of Systems  Constitutional: Positive for fever.  Respiratory: Positive for cough.   Gastrointestinal: Positive for nausea and abdominal pain.  Allergic/Immunologic: Positive for immunocompromised state.       Cancer patient on chemotherapy  All other systems reviewed and are negative.     Allergies  Statins; Ace inhibitors; Codeine; and Levaquin  Home Medications   Prior to Admission medications   Medication Sig Start Date End Date Taking? Authorizing Provider  acetaminophen (TYLENOL) 500 MG tablet Take 1,000 mg by mouth 2 (two) times daily as needed for  headache.     Historical Provider, MD  aspirin EC 81 MG tablet Take 1 tablet (81 mg total) by mouth daily. 11/08/14   Thayer Headings, MD  budesonide-formoterol [facility]) 160-4.5 MCG/ACT inhaler Inhale 2 puffs into the lungs 2 (two) times daily as needed (for bronchitis). Reported on 03/19/2015    Historical Provider, MD  dibucaine (NUPERCAINAL) 1 % ointment Apply topically 3 (three) times daily as needed for pain (for hemorrhoidal discomfort.). 04/26/15   Brunetta Genera, MD  fentaNYL (DURAGESIC - DOSED MCG/HR) 25 MCG/HR patch Place 1 patch (25 mcg total) onto the skin every 3 (three) days. 04/17/15   Brunetta Genera, MD  HYDROmorphone (DILAUDID) 2 MG tablet Take 1 tablet (2 mg total) by mouth every 4 (four) hours as needed for severe pain. 04/09/15    Brunetta Genera, MD  lidocaine-prilocaine (EMLA) cream Apply to affected area once 04/17/15   Brunetta Genera, MD  LORazepam (ATIVAN) 0.5 MG tablet Take 1 tablet (0.5 mg total) by mouth every 6 (six) hours as needed (Nausea or vomiting). 04/17/15   Brunetta Genera, MD  metoprolol tartrate (LOPRESSOR) 25 MG tablet Take 0.5 tablets (12.5 mg total) by mouth 2 (two) times daily. 09/28/14   Shawnee Knapp, MD  nitroGLYCERIN (NITROSTAT) 0.4 MG SL tablet Place 0.4 mg under the tongue every 5 (five) minutes as needed for chest pain. Reported on 03/19/2015    Historical Provider, MD  omeprazole (PRILOSEC) 40 MG capsule Take 1 capsule (40 mg total) by mouth 2 (two) times daily. 01/24/15   Irene Shipper, MD  ondansetron (ZOFRAN) 8 MG tablet Take 1 tablet (8 mg total) by mouth 2 (two) times daily as needed (Nausea or vomiting). 04/17/15   Brunetta Genera, MD  Pancrelipase, Lip-Prot-Amyl, 24000 units CPEP Take 1 capsule (24,000 Units total) by mouth 3 (three) times daily with meals. 03/22/15   Brunetta Genera, MD  Polyethyl Glycol-Propyl Glycol (SYSTANE OP) Apply 1 drop to eye daily as needed (for dry eyes).    Historical Provider, MD  Polyvinyl Alcohol-Povidone (REFRESH OP) Apply 1 drop to eye daily as needed (for dry eyes).    Historical Provider, MD  prochlorperazine (COMPAZINE) 10 MG tablet Take 1 tablet (10 mg total) by mouth every 6 (six) hours as needed (Nausea or vomiting). 04/17/15   Brunetta Genera, MD  senna-docusate (SENNA S) 8.6-50 MG tablet Take 2 tablets by mouth 2 (two) times daily. 04/17/15   Brunetta Genera, MD   BP 112/70 mmHg  Pulse 117  Temp(Src) 100.2 F (37.9 C) (Oral)  Resp 16  Ht 4\' 10"  (1.473 m)  Wt 141 lb (63.957 kg)  BMI 29.48 kg/m2  SpO2 95% Physical Exam  Constitutional: She appears well-developed and well-nourished.  HENT:  Head: Normocephalic and atraumatic.  Eyes: Conjunctivae are normal. Pupils are equal, round, and reactive to light.  Neck: Neck  supple. No tracheal deviation present. No thyromegaly present.  Cardiovascular: Normal rate and regular rhythm.   No murmur heard. Pulmonary/Chest: Effort normal and breath sounds normal.  POrta cath in place right chest. Site not read warm or tender  Abdominal: Soft. Bowel sounds are normal. She exhibits no distension. There is no tenderness.  Musculoskeletal: Normal range of motion. She exhibits no edema or tenderness.  Neurological: She is alert. Coordination normal.  Skin: Skin is warm and dry. No rash noted.  Psychiatric: She has a normal mood and affect.  Nursing note and vitals reviewed.   ED  Course  Procedures (including critical care time) Labs Review Labs Reviewed  CULTURE, BLOOD (ROUTINE X 2)  CULTURE, BLOOD (ROUTINE X 2)  URINE CULTURE  COMPREHENSIVE METABOLIC PANEL  URINALYSIS, ROUTINE W REFLEX MICROSCOPIC (NOT AT [facility])  CBC WITH DIFFERENTIAL/PLATELET  I-STAT CG4 LACTIC ACID, ED  I-STAT CG4 LACTIC ACID, ED    Imaging Review No results found. I have personally reviewed and evaluated these images and lab results as part of my medical decision-making.   EKG Interpretation   Date/Time:  Monday April 30 2015 20:04:13 EST Ventricular Rate:  111 PR Interval:  109 QRS Duration: 104 QT Interval:  345 QTC Calculation: 469 R Axis:   34 Text Interpretation:  Sinus tachycardia No old tracing to compare  Confirmed by Kynadie Yaun  MD, Josselin Gaulin [phone]) on 04/30/2015 8:33:39 PM     10:50 PM after 30 mL/kg intravenous normal saline fluid bolus patient remains hypotensive. Repeat EKG 2 3:04 PM.  ED ECG REPORT   Date: 04/30/2015  Rate: 90  Rhythm: normal sinus rhythm  QRS Axis: normal  Intervals: normal  ST/T Wave abnormalities: normal  Conduction Disutrbances:none  Narrative Interpretation:   Old EKG Reviewed: unchanged  I have personally reviewed the EKG tracing and agree with the computerized printout as noted.  12:30 AM she remains alert Glasgow coma score 15  appropriate denies dyspnea. She remains hypotensive.the patient signed out to Dr. Vanita Panda at 1230 pm Results for orders placed or performed during the hospital encounter of 04/30/15  Comprehensive metabolic panel  Result Value Ref Range   Sodium 138 135 - 145 mmol/L   Potassium 3.1 (L) 3.5 - 5.1 mmol/L   Chloride 98 (L) 101 - 111 mmol/L   CO2 27 22 - 32 mmol/L   Glucose, Bld 107 (H) 65 - 99 mg/dL   BUN 13 6 - 20 mg/dL   Creatinine, Ser 0.81 0.44 - 1.00 mg/dL   Calcium 8.1 (L) 8.9 - 10.3 mg/dL   Total Protein 6.2 (L) 6.5 - 8.1 g/dL   Albumin 2.5 (L) 3.5 - 5.0 g/dL   AST 26 15 - 41 U/L   ALT 11 (L) 14 - 54 U/L   Alkaline Phosphatase 82 38 - 126 U/L   Total Bilirubin 0.9 0.3 - 1.2 mg/dL   GFR calc non Af Amer >60 >60 mL/min   GFR calc Af Amer >60 >60 mL/min   Anion gap 13 5 - 15  Urinalysis, Routine w reflex microscopic (not at [facility])  Result Value Ref Range   Color, Urine AMBER (A) YELLOW   APPearance TURBID (A) CLEAR   Specific Gravity, Urine 1.017 1.005 - 1.030   pH 6.5 5.0 - 8.0   Glucose, UA NEGATIVE NEGATIVE mg/dL   Hgb urine dipstick LARGE (A) NEGATIVE   Bilirubin Urine SMALL (A) NEGATIVE   Ketones, ur NEGATIVE NEGATIVE mg/dL   Protein, ur 100 (A) NEGATIVE mg/dL   Nitrite NEGATIVE NEGATIVE   Leukocytes, UA LARGE (A) NEGATIVE  CBC WITH DIFFERENTIAL  Result Value Ref Range   WBC 25.3 (H) 4.0 - 10.5 K/uL   RBC 3.34 (L) 3.87 - 5.11 MIL/uL   Hemoglobin 8.6 (L) 12.0 - 15.0 g/dL   HCT 28.3 (L) 36.0 - 46.0 %   MCV 84.7 78.0 - 100.0 fL   MCH 25.7 (L) 26.0 - 34.0 pg   MCHC 30.4 30.0 - 36.0 g/dL   RDW 17.8 (H) 11.5 - 15.5 %   Platelets 303 150 - 400 K/uL   Neutrophils Relative % 84 %  Lymphocytes Relative 8 %   Monocytes Relative 8 %   Eosinophils Relative 0 %   Basophils Relative 0 %   Neutro Abs 21.3 (H) 1.7 - 7.7 K/uL   Lymphs Abs 2.0 0.7 - 4.0 K/uL   Monocytes Absolute 2.0 (H) 0.1 - 1.0 K/uL   Eosinophils Absolute 0.0 0.0 - 0.7 K/uL   Basophils Absolute 0.0 0.0 -  0.1 K/uL   WBC Morphology WHITE COUNT CONFIRMED ON SMEAR   Urine microscopic-add on  Result Value Ref Range   Squamous Epithelial / LPF 0-5 (A) NONE SEEN   WBC, UA TOO NUMEROUS TO COUNT 0 - 5 WBC/hpf   RBC / HPF TOO NUMEROUS TO COUNT 0 - 5 RBC/hpf   Bacteria, UA MANY (A) NONE SEEN  I-Stat CG4 Lactic Acid, ED  (not at [facility])  Result Value Ref Range   Lactic Acid, Venous 1.47 0.5 - 2.0 mmol/L  I-Stat CG4 Lactic Acid, ED  (not at  [facility])  Result Value Ref Range   Lactic Acid, Venous 1.25 0.5 - 2.0 mmol/L  I-Stat CG4 Lactic Acid, ED  (not at [facility])  Result Value Ref Range   Lactic Acid, Venous 0.70 0.5 - 2.0 mmol/L  I-stat troponin, ED  Result Value Ref Range   Troponin i, poc 0.08 0.00 - 0.08 ng/mL   Comment 3           Dg Chest 2 View  04/30/2015  CLINICAL DATA:  New onset fever. Nausea this morning. Patient on chemotherapy (most recent 5 days prior) for pancreatic cancer. EXAM: CHEST  2 VIEW COMPARISON:  Chest radiographs 04/11/2014, PET-CT 03/29/2015 FINDINGS: Tip of the right chest port in the SVC. Patient is post median sternotomy. Cardiomediastinal contours are unchanged. There is a moderate retrocardiac hiatal hernia. Linear density in the right midlung may be atelectasis, pleural thickening or minimal fluid in the minor fissure. No confluent airspace disease. Minimal blunting of both costophrenic angles, may reflect small pleural effusions. No pneumothorax. Small pulmonary nodules on PET-CT not well seen radiographically. There are surgical clips in the left axilla. IMPRESSION: 1. No evidence of pneumonia. 2. Linear right midlung opacity may be atelectasis, pleural thickening or fluid in the right minor fissure. 3. Question small pleural effusions. Electronically Signed   By: Jeb Levering M.D.   On: 04/30/2015 20:02   Ct Abdomen Pelvis W Contrast  04/30/2015  CLINICAL DATA:  New onset fever and nausea. Abdominal tenderness. Recent diagnosis of pancreatic cancer. Most recent chemotherapy  five days prior 04/26/2015. EXAM: CT ABDOMEN AND PELVIS WITH CONTRAST TECHNIQUE: Multidetector CT imaging of the abdomen and pelvis was performed using the standard protocol following bolus administration of intravenous contrast. CONTRAST:  153mL OMNIPAQUE IOHEXOL 300 MG/ML  SOLN COMPARISON:  PET-CT 10/16/2015.  Abdominal CT 02/09/2015 FINDINGS: Lower chest: Tiny bilateral pleural effusions with adjacent atelectasis at the lung bases. Hiatal hernia unchanged in size. Coronary artery calcifications and atherosclerosis of the distal thoracic aorta. Liver: Scattered subcentimeter hypodensities throughout the liver, nonspecific, not significantly changed from prior exam. Hepatobiliary: Gallbladder physiologically distended, no calcified stone. There is a Phrygian cap. No biliary dilatation. Pancreas: Ill-defined hypodense lesion adjacent to the uncinate process of the pancreas and duodenum has diminished in size currently measuring 2.6 x 1.9 cm, previously 4.0 x 3.5 cm on prior CT. No pancreatic ductal dilatation. Spleen: No focal lesion. Adrenal glands: No nodule. Kidneys: Symmetric renal enhancement. No hydronephrosis. No perinephric stranding. Stomach/Bowel: Stomach is decompressed, hiatal hernia again seen. There are no  dilated or thickened small bowel loops. Small volume of stool throughout the colon without colonic wall thickening. The appendix is not visualized. Vascular/Lymphatic: No retroperitoneal adenopathy. Abdominal aorta is normal in caliber. Dense atherosclerosis without aneurysm. Retroperitoneal mass encompasses the superior mesenteric artery as described previously. Omental nodularity and soft tissue density is, with minimal increase in the interim. For example, the anterior omental nodule measures 1.6 cm, previously 1.3 cm. The small bowel mesenteric nodules are difficult to differentiate from mesenteric ascites. Reproductive: Left ovarian mass again seen, 4.3 x 4.0 cm, not significantly changed  allowing for differences in caliper placement. Uterus remains in situ. Right ovary not seen. Bladder: Completely decompressed. Other: Moderate volume of intra-abdominal and pelvic ascites, grossly unchanged to minimally increased from prior PET. Diffuse omental nodularity and soft tissue stranding. No free air. Musculoskeletal: There are no acute or suspicious osseous abnormalities. Degenerative change in the spine. IMPRESSION: 1. Decreased size of the soft tissue mass in the retroperitoneum, likely arising from the uncinate process of the pancreas versus less likely transverse duodenum. There is however increased size of omental nodules from prior PET. Diffuse omental and peritoneal nodularity again seen. Volume of intra-abdominal and pelvic ascites is unchanged to minimally increased. 2. Small pleural effusions with adjacent atelectasis at the lung bases. 3. Left ovarian lesion, unchanged in the interim. 4. Hiatal hernia again seen. Additional chronic findings as described. Electronically Signed   By: Jeb Levering M.D.   On: 04/30/2015 22:11   Ir Fluoro Guide Cv Line Right  04/26/2015  ADDENDUM REPORT: 04/26/2015 09:54 ADDENDUM: Nursing monitored the patient during sedation. Electronically Signed   By: Marybelle Killings M.D.   On: 04/26/2015 09:54  04/26/2015  CLINICAL DATA:  Pancreatic cancer EXAM: TUNNEL POWER PORT PLACEMENT WITH SUBCUTANEOUS POCKET UTILIZING ULTRASOUND & FLOUROSCOPY FLUOROSCOPY TIME:  24 seconds MEDICATIONS AND MEDICAL HISTORY: Versed 1.5 mg, Fentanyl 50 mcg. Additional Medications: As antibiotic prophylaxis, Ancef was ordered pre-procedure and administered intravenously within one hour of incision. ANESTHESIA/SEDATION: Moderate sedation time: 25 minutes CONTRAST:  None PROCEDURE: After written informed consent was obtained, patient was placed in the supine position on angiographic table. The right neck and chest was prepped and draped in a sterile fashion. Lidocaine was utilized for local  anesthesia. The right jugular vein was noted to be patent initially with ultrasound. Under sonographic guidance, a micropuncture needle was inserted into the right IJ vein (Ultrasound and fluoroscopic image documentation was performed). The needle was removed over an 018 wire which was exchanged for a Amplatz. This was advanced into the IVC. An 8-French dilator was advanced over the Amplatz. A small incision was made in the right upper chest over the anterior right second rib. Utilizing blunt dissection, a subcutaneous pocket was created in the caudal direction. The pocket was irrigated with a copious amount of sterile normal saline. The port catheter was tunneled from the chest incision, and out the neck incision. The reservoir was inserted into the subcutaneous pocket and secured with two 3-0 Ethilon stitches. A peel-away sheath was advanced over the Amplatz wire. The port catheter was cut to measure length and inserted through the peel-away sheath. The peel-away sheath was removed. The chest incision was closed with 3-0 Vicryl interrupted stitches for the subcutaneous tissue and a running of 4-0 Vicryl subcuticular stitch for the skin. The neck incision was closed with a 4-0 Vicryl subcuticular stitch. Derma-bond was applied to both surgical incisions. The port reservoir was flushed and instilled with heparinized saline. No complications. FINDINGS: A  right IJ vein Port-A-Cath is in place with its tip at the cavoatrial junction. COMPLICATIONS: None IMPRESSION: Successful 8 French right internal jugular vein power port placement with its tip at the SVC/RA junction. Electronically Signed: By: Marybelle Killings M.D. On: 04/18/2015 16:22   Ir US Guide Vasc Access Right  04/26/2015  ADDENDUM REPORT: 04/26/2015 09:54 ADDENDUM: Nursing monitored the patient during sedation. Electronically Signed   By: Marybelle Killings M.D.   On: 04/26/2015 09:54  04/26/2015  CLINICAL DATA:  Pancreatic cancer EXAM: TUNNEL POWER PORT PLACEMENT  WITH SUBCUTANEOUS POCKET UTILIZING ULTRASOUND & FLOUROSCOPY FLUOROSCOPY TIME:  24 seconds MEDICATIONS AND MEDICAL HISTORY: Versed 1.5 mg, Fentanyl 50 mcg. Additional Medications: As antibiotic prophylaxis, Ancef was ordered pre-procedure and administered intravenously within one hour of incision. ANESTHESIA/SEDATION: Moderate sedation time: 25 minutes CONTRAST:  None PROCEDURE: After written informed consent was obtained, patient was placed in the supine position on angiographic table. The right neck and chest was prepped and draped in a sterile fashion. Lidocaine was utilized for local anesthesia. The right jugular vein was noted to be patent initially with ultrasound. Under sonographic guidance, a micropuncture needle was inserted into the right IJ vein (Ultrasound and fluoroscopic image documentation was performed). The needle was removed over an 018 wire which was exchanged for a Amplatz. This was advanced into the IVC. An 8-French dilator was advanced over the Amplatz. A small incision was made in the right upper chest over the anterior right second rib. Utilizing blunt dissection, a subcutaneous pocket was created in the caudal direction. The pocket was irrigated with a copious amount of sterile normal saline. The port catheter was tunneled from the chest incision, and out the neck incision. The reservoir was inserted into the subcutaneous pocket and secured with two 3-0 Ethilon stitches. A peel-away sheath was advanced over the Amplatz wire. The port catheter was cut to measure length and inserted through the peel-away sheath. The peel-away sheath was removed. The chest incision was closed with 3-0 Vicryl interrupted stitches for the subcutaneous tissue and a running of 4-0 Vicryl subcuticular stitch for the skin. The neck incision was closed with a 4-0 Vicryl subcuticular stitch. Derma-bond was applied to both surgical incisions. The port reservoir was flushed and instilled with heparinized saline. No  complications. FINDINGS: A right IJ vein Port-A-Cath is in place with its tip at the cavoatrial junction. COMPLICATIONS: None IMPRESSION: Successful 8 French right internal jugular vein power port placement with its tip at the SVC/RA junction. Electronically Signed: By: Marybelle Killings M.D. On: 04/18/2015 16:22   Dg Chest Port 1 View  04/12/2015  CLINICAL DATA:  Oxygen desaturation EXAM: PORTABLE CHEST 1 VIEW COMPARISON:  03/29/2015 FINDINGS: Moderate to large hiatal hernia.  Mild cardiac enlargement. Mild left lower lobe atelectasis. Band of opacity right mid lung zone appears consistent with subsegmental atelectasis similar to prior study. IMPRESSION: Compare sent to CT scan, allowing for comparison of different modalities, shows no significant change with continued subsegmental atelectasis right mid lung zone. Electronically Signed   By: Skipper Cliche M.D.   On: 04/12/2015 13:47    MDM  COde sepsis called based on surgical cart. Fever, leukocytosis, source unknown. Final diagnoses:  None  critical care medicine consulted after fluid resuscitation and antibiotic therapy. intensivist suggest transfusion with 2 additional liters of lactated Ringer's before starting pressors.They will come to evaluate patient for admission Patient remains hypotensive in spite of fluid resuscitation Consistent with septic shock Patient meets clinical criteria for septic shock with  at least 2 SIRS, evidence of end organ damage and  and suspected source of infection is urine. Persistent hypotension despite adequate fluid resuscitation.  Anemia is chronic Dx #1 septic shock #2 urinary tract infection  #3 hypokalemia CRITICAL CARE Performed by: Orlie Dakin Total critical care time: 60 minutes Critical care time was exclusive of separately billable procedures and treating other patients. Critical care was necessary to treat or prevent imminent or life-threatening deterioration. Critical care was time spent  personally by me on the following activities: development of treatment plan with patient and/or surrogate as well as nursing, discussions with consultants, evaluation of patient's response to treatment, examination of patient, obtaining history from patient or surrogate, ordering and performing treatments and interventions, ordering and review of laboratory studies, ordering and review of radiographic studies, pulse oximetry and re-evaluation of patient's condition.  Orlie Dakin, MD 05/01/15 [phone]

## 2015-04-30 NOTE — ED Notes (Signed)
Patient is alert, oriented, denies pain. Patient appears to be in no acute distress. Patient has fluids and antibiotics currently running. Patient has blood pressure cycling and on the heart monitor and pulse ox.

## 2015-05-01 ENCOUNTER — Encounter (HOSPITAL_COMMUNITY): Payer: Self-pay | Admitting: Internal Medicine

## 2015-05-01 ENCOUNTER — Telehealth: Payer: Self-pay | Admitting: *Deleted

## 2015-05-01 DIAGNOSIS — J9601 Acute respiratory failure with hypoxia: Secondary | ICD-10-CM | POA: Diagnosis present

## 2015-05-01 DIAGNOSIS — Z825 Family history of asthma and other chronic lower respiratory diseases: Secondary | ICD-10-CM | POA: Diagnosis not present

## 2015-05-01 DIAGNOSIS — C259 Malignant neoplasm of pancreas, unspecified: Secondary | ICD-10-CM | POA: Diagnosis present

## 2015-05-01 DIAGNOSIS — Z888 Allergy status to other drugs, medicaments and biological substances status: Secondary | ICD-10-CM | POA: Diagnosis not present

## 2015-05-01 DIAGNOSIS — I429 Cardiomyopathy, unspecified: Secondary | ICD-10-CM | POA: Diagnosis present

## 2015-05-01 DIAGNOSIS — I340 Nonrheumatic mitral (valve) insufficiency: Secondary | ICD-10-CM | POA: Diagnosis present

## 2015-05-01 DIAGNOSIS — C786 Secondary malignant neoplasm of retroperitoneum and peritoneum: Secondary | ICD-10-CM

## 2015-05-01 DIAGNOSIS — Z7951 Long term (current) use of inhaled steroids: Secondary | ICD-10-CM | POA: Diagnosis not present

## 2015-05-01 DIAGNOSIS — E860 Dehydration: Secondary | ICD-10-CM | POA: Diagnosis present

## 2015-05-01 DIAGNOSIS — N390 Urinary tract infection, site not specified: Secondary | ICD-10-CM | POA: Diagnosis present

## 2015-05-01 DIAGNOSIS — Z7982 Long term (current) use of aspirin: Secondary | ICD-10-CM | POA: Diagnosis not present

## 2015-05-01 DIAGNOSIS — Z806 Family history of leukemia: Secondary | ICD-10-CM | POA: Diagnosis not present

## 2015-05-01 DIAGNOSIS — Z803 Family history of malignant neoplasm of breast: Secondary | ICD-10-CM | POA: Diagnosis not present

## 2015-05-01 DIAGNOSIS — E876 Hypokalemia: Secondary | ICD-10-CM | POA: Diagnosis present

## 2015-05-01 DIAGNOSIS — I10 Essential (primary) hypertension: Secondary | ICD-10-CM | POA: Diagnosis present

## 2015-05-01 DIAGNOSIS — E669 Obesity, unspecified: Secondary | ICD-10-CM | POA: Diagnosis present

## 2015-05-01 DIAGNOSIS — I252 Old myocardial infarction: Secondary | ICD-10-CM | POA: Diagnosis not present

## 2015-05-01 DIAGNOSIS — I2510 Atherosclerotic heart disease of native coronary artery without angina pectoris: Secondary | ICD-10-CM | POA: Diagnosis present

## 2015-05-01 DIAGNOSIS — T451X5A Adverse effect of antineoplastic and immunosuppressive drugs, initial encounter: Secondary | ICD-10-CM | POA: Diagnosis present

## 2015-05-01 DIAGNOSIS — Z79899 Other long term (current) drug therapy: Secondary | ICD-10-CM | POA: Diagnosis not present

## 2015-05-01 DIAGNOSIS — D62 Acute posthemorrhagic anemia: Secondary | ICD-10-CM

## 2015-05-01 DIAGNOSIS — R188 Other ascites: Secondary | ICD-10-CM | POA: Diagnosis present

## 2015-05-01 DIAGNOSIS — D638 Anemia in other chronic diseases classified elsewhere: Secondary | ICD-10-CM | POA: Diagnosis present

## 2015-05-01 DIAGNOSIS — I509 Heart failure, unspecified: Secondary | ICD-10-CM | POA: Diagnosis not present

## 2015-05-01 DIAGNOSIS — A419 Sepsis, unspecified organism: Principal | ICD-10-CM

## 2015-05-01 DIAGNOSIS — R531 Weakness: Secondary | ICD-10-CM | POA: Diagnosis present

## 2015-05-01 DIAGNOSIS — Z800 Family history of malignant neoplasm of digestive organs: Secondary | ICD-10-CM | POA: Diagnosis not present

## 2015-05-01 DIAGNOSIS — R509 Fever, unspecified: Secondary | ICD-10-CM | POA: Diagnosis not present

## 2015-05-01 DIAGNOSIS — Z66 Do not resuscitate: Secondary | ICD-10-CM | POA: Diagnosis present

## 2015-05-01 DIAGNOSIS — R0602 Shortness of breath: Secondary | ICD-10-CM | POA: Diagnosis not present

## 2015-05-01 DIAGNOSIS — J209 Acute bronchitis, unspecified: Secondary | ICD-10-CM | POA: Diagnosis present

## 2015-05-01 DIAGNOSIS — Z86010 Personal history of colonic polyps: Secondary | ICD-10-CM | POA: Diagnosis not present

## 2015-05-01 DIAGNOSIS — D6481 Anemia due to antineoplastic chemotherapy: Secondary | ICD-10-CM | POA: Diagnosis present

## 2015-05-01 DIAGNOSIS — Z951 Presence of aortocoronary bypass graft: Secondary | ICD-10-CM | POA: Diagnosis not present

## 2015-05-01 DIAGNOSIS — J208 Acute bronchitis due to other specified organisms: Secondary | ICD-10-CM | POA: Diagnosis not present

## 2015-05-01 DIAGNOSIS — R6510 Systemic inflammatory response syndrome (SIRS) of non-infectious origin without acute organ dysfunction: Secondary | ICD-10-CM | POA: Diagnosis not present

## 2015-05-01 DIAGNOSIS — D630 Anemia in neoplastic disease: Secondary | ICD-10-CM | POA: Diagnosis present

## 2015-05-01 DIAGNOSIS — K219 Gastro-esophageal reflux disease without esophagitis: Secondary | ICD-10-CM | POA: Diagnosis present

## 2015-05-01 DIAGNOSIS — Z515 Encounter for palliative care: Secondary | ICD-10-CM | POA: Diagnosis present

## 2015-05-01 DIAGNOSIS — R6521 Severe sepsis with septic shock: Secondary | ICD-10-CM

## 2015-05-01 DIAGNOSIS — Z881 Allergy status to other antibiotic agents status: Secondary | ICD-10-CM | POA: Diagnosis not present

## 2015-05-01 DIAGNOSIS — Z8249 Family history of ischemic heart disease and other diseases of the circulatory system: Secondary | ICD-10-CM | POA: Diagnosis not present

## 2015-05-01 DIAGNOSIS — E43 Unspecified severe protein-calorie malnutrition: Secondary | ICD-10-CM | POA: Diagnosis present

## 2015-05-01 DIAGNOSIS — J440 Chronic obstructive pulmonary disease with acute lower respiratory infection: Secondary | ICD-10-CM | POA: Diagnosis present

## 2015-05-01 DIAGNOSIS — I5023 Acute on chronic systolic (congestive) heart failure: Secondary | ICD-10-CM | POA: Diagnosis present

## 2015-05-01 DIAGNOSIS — H5500 Unspecified nystagmus: Secondary | ICD-10-CM | POA: Diagnosis present

## 2015-05-01 DIAGNOSIS — D72829 Elevated white blood cell count, unspecified: Secondary | ICD-10-CM | POA: Diagnosis present

## 2015-05-01 DIAGNOSIS — I5021 Acute systolic (congestive) heart failure: Secondary | ICD-10-CM | POA: Diagnosis not present

## 2015-05-01 DIAGNOSIS — Z9981 Dependence on supplemental oxygen: Secondary | ICD-10-CM | POA: Diagnosis not present

## 2015-05-01 DIAGNOSIS — Z87891 Personal history of nicotine dependence: Secondary | ICD-10-CM | POA: Diagnosis not present

## 2015-05-01 DIAGNOSIS — Z6829 Body mass index (BMI) 29.0-29.9, adult: Secondary | ICD-10-CM | POA: Diagnosis not present

## 2015-05-01 DIAGNOSIS — Z853 Personal history of malignant neoplasm of breast: Secondary | ICD-10-CM | POA: Diagnosis not present

## 2015-05-01 DIAGNOSIS — Z885 Allergy status to narcotic agent status: Secondary | ICD-10-CM | POA: Diagnosis not present

## 2015-05-01 DIAGNOSIS — E785 Hyperlipidemia, unspecified: Secondary | ICD-10-CM | POA: Diagnosis present

## 2015-05-01 DIAGNOSIS — R2681 Unsteadiness on feet: Secondary | ICD-10-CM | POA: Diagnosis present

## 2015-05-01 LAB — BASIC METABOLIC PANEL
Anion gap: 7 (ref 5–15)
BUN: 12 mg/dL (ref 6–20)
CALCIUM: 6.9 mg/dL — AB (ref 8.9–10.3)
CHLORIDE: 102 mmol/L (ref 101–111)
CO2: 26 mmol/L (ref 22–32)
CREATININE: 0.68 mg/dL (ref 0.44–1.00)
GFR calc non Af Amer: >60 mL/min (ref >60)
GLUCOSE: 113 mg/dL — AB (ref 65–99)
Potassium: 3.2 mmol/L — ABNORMAL LOW (ref 3.5–5.1)
Sodium: 135 mmol/L (ref 135–145)

## 2015-05-01 LAB — CBC
HEMATOCRIT: 22.1 % — AB (ref 36.0–46.0)
HEMOGLOBIN: 6.7 g/dL — AB (ref 12.0–15.0)
MCH: 25.8 pg — AB (ref 26.0–34.0)
MCHC: 30.3 g/dL (ref 30.0–36.0)
MCV: 85 fL (ref 78.0–100.0)
Platelets: 237 10*3/uL (ref 150–400)
RBC: 2.6 MIL/uL — ABNORMAL LOW (ref 3.87–5.11)
RDW: 17.8 % — AB (ref 11.5–15.5)
WBC: 18.1 10*3/uL — ABNORMAL HIGH (ref 4.0–10.5)

## 2015-05-01 LAB — ABO/RH: ABO/RH(D): O POS

## 2015-05-01 LAB — PREPARE RBC (CROSSMATCH)

## 2015-05-01 MED ORDER — HYDROMORPHONE HCL 2 MG PO TABS
2.0000 mg | ORAL_TABLET | ORAL | Status: DC | PRN
  Administered 2015-05-01 – 2015-05-05 (×15): 2 mg via ORAL
  Filled 2015-05-01 (×15): qty 1

## 2015-05-01 MED ORDER — PANCRELIPASE (LIP-PROT-AMYL) 12000-38000 UNITS PO CPEP
24000.0000 [IU] | ORAL_CAPSULE | Freq: Three times a day (TID) | ORAL | Status: DC
  Administered 2015-05-01: 12000 [IU] via ORAL
  Administered 2015-05-01 – 2015-05-05 (×12): 24000 [IU] via ORAL
  Filled 2015-05-01 (×18): qty 2

## 2015-05-01 MED ORDER — HEPARIN SODIUM (PORCINE) 5000 UNIT/ML IJ SOLN
5000.0000 [IU] | Freq: Three times a day (TID) | INTRAMUSCULAR | Status: DC
  Administered 2015-05-01 – 2015-05-05 (×12): 5000 [IU] via SUBCUTANEOUS
  Filled 2015-05-01 (×15): qty 1

## 2015-05-01 MED ORDER — NITROGLYCERIN 0.4 MG SL SUBL: 0.4000 mg | SUBLINGUAL_TABLET | SUBLINGUAL | Status: DC | PRN

## 2015-05-01 MED ORDER — ASPIRIN EC 81 MG PO TBEC
81.0000 mg | DELAYED_RELEASE_TABLET | Freq: Every day | ORAL | Status: DC
Start: 2015-05-01 — End: 2015-05-05
  Administered 2015-05-01 – 2015-05-05 (×5): 81 mg via ORAL
  Filled 2015-05-01 (×5): qty 1

## 2015-05-01 MED ORDER — NOREPINEPHRINE BITARTRATE 1 MG/ML IV SOLN: 2.0000 ug/kg/min | Freq: Once | INTRAVENOUS | Status: DC

## 2015-05-01 MED ORDER — SENNOSIDES-DOCUSATE SODIUM 8.6-50 MG PO TABS
2.0000 | ORAL_TABLET | Freq: Two times a day (BID) | ORAL | Status: DC
  Administered 2015-05-01 – 2015-05-03 (×2): 2 via ORAL
  Filled 2015-05-01 (×9): qty 2

## 2015-05-01 MED ORDER — ONDANSETRON HCL 8 MG PO TABS
8.0000 mg | ORAL_TABLET | Freq: Two times a day (BID) | ORAL | Status: DC | PRN
  Administered 2015-05-01 – 2015-05-04 (×3): 8 mg via ORAL
  Filled 2015-05-01 (×3): qty 1

## 2015-05-01 MED ORDER — POLYVINYL ALCOHOL 1.4 % OP SOLN
1.0000 [drp] | OPHTHALMIC | Status: DC | PRN
  Filled 2015-05-01: qty 15

## 2015-05-01 MED ORDER — ARTIFICIAL TEARS OP OINT
TOPICAL_OINTMENT | Freq: Every day | OPHTHALMIC | Status: DC | PRN
  Filled 2015-05-01: qty 3.5

## 2015-05-01 MED ORDER — PANTOPRAZOLE SODIUM 40 MG PO TBEC: 80.0000 mg | DELAYED_RELEASE_TABLET | Freq: Every day | ORAL | Status: DC

## 2015-05-01 MED ORDER — ACETAMINOPHEN 500 MG PO TABS
1000.0000 mg | ORAL_TABLET | Freq: Two times a day (BID) | ORAL | Status: DC | PRN
Start: 2015-05-01 — End: 2015-05-05

## 2015-05-01 MED ORDER — SODIUM CHLORIDE 0.9 % IV SOLN
Freq: Once | INTRAVENOUS | Status: AC
  Administered 2015-05-01: 10:00:00 via INTRAVENOUS

## 2015-05-01 MED ORDER — NOREPINEPHRINE BITARTRATE 1 MG/ML IV SOLN
0.0000 ug/min | INTRAVENOUS | Status: DC
  Administered 2015-05-01: 20 ug/min via INTRAVENOUS
  Administered 2015-05-01: 15 ug/min via INTRAVENOUS
  Filled 2015-05-01: qty 4

## 2015-05-01 MED ORDER — SODIUM CHLORIDE 0.9 % IV SOLN
INTRAVENOUS | Status: DC
  Administered 2015-05-01 – 2015-05-02 (×3): via INTRAVENOUS

## 2015-05-01 MED ORDER — LORAZEPAM 0.5 MG PO TABS
0.5000 mg | ORAL_TABLET | Freq: Four times a day (QID) | ORAL | Status: DC | PRN
  Administered 2015-05-01 – 2015-05-04 (×5): 0.5 mg via ORAL
  Filled 2015-05-01 (×5): qty 1

## 2015-05-01 MED ORDER — MOMETASONE FURO-FORMOTEROL FUM 200-5 MCG/ACT IN AERO
2.0000 | INHALATION_SPRAY | Freq: Two times a day (BID) | RESPIRATORY_TRACT | Status: DC
Start: 2015-05-01 — End: 2015-05-05
  Administered 2015-05-03 – 2015-05-05 (×5): 2 via RESPIRATORY_TRACT
  Filled 2015-05-01: qty 8.8

## 2015-05-01 MED ORDER — PANTOPRAZOLE SODIUM 40 MG PO TBEC
80.0000 mg | DELAYED_RELEASE_TABLET | Freq: Two times a day (BID) | ORAL | Status: DC
  Administered 2015-05-01 – 2015-05-05 (×10): 80 mg via ORAL
  Filled 2015-05-01 (×11): qty 2

## 2015-05-01 MED ORDER — FENTANYL 25 MCG/HR TD PT72
25.0000 ug | MEDICATED_PATCH | TRANSDERMAL | Status: DC
  Administered 2015-05-01 – 2015-05-04 (×2): 25 ug via TRANSDERMAL
  Filled 2015-05-01 (×2): qty 1

## 2015-05-01 NOTE — Progress Notes (Signed)
Patient with HGB 6.7.  Likely dilutional due to fluid resuscitation.  Offered PRBC transfusion, but patient declined this at this point in time.

## 2015-05-01 NOTE — Telephone Encounter (Signed)
"  I'm in the hospital.  The appointments I have March 9th need to be rearranged.  I can be reached in my room at [phone]."

## 2015-05-01 NOTE — Consult Note (Signed)
Name: Tammy Schmitt MRN: [id number] DOB: [birth year]    ADMISSION DATE:  04/30/2015 CONSULTATION DATE:  04/30/2015  REFERRING MD :  ED  CHIEF COMPLAINT:  malaise  BRIEF PATIENT DESCRIPTION:  51F, with recent diagnosis of metastatic pancreatic adenocarcinoma, on palliative chemotherapy, presents with 2 day history of generalized weakness, feeling unwell, low-grade fever. Initial blood pressure was 112/70. She had a fever for 100.2. She had a white count of 25. Code sepsis was called. Her blood pressure dropped in the 70s. PCCM was consulted for ICU admission and pressors. Pt was undecided with pressors.   SIGNIFICANT EVENTS  3/6 ED admit for nausea, fatigue, fever, elevated WBC.   STUDIES:  3/6 abd ct scan >> no focus of infxn seen   HISTORY OF PRESENT ILLNESS:   51F, with recent diagnosis of metastatic pancreatic adenocarcinoma, on palliative chemotherapy, presents with 2 day history of generalized weakness, feeling unwell, low-grade fever. Initial blood pressure was 112/70. She had a fever for 100.2. She had a white count of 25. Code sepsis was called. Her blood pressure dropped in the 70s. PCCM was consulted for ICU admission and pressors. Pt was undecided with pressors.   Patient received a total of 4 L IVF. Blood pressure subsequently improved to [id number] systolic. Mentating well. Feel some better.    PAST MEDICAL HISTORY :   has a past medical history of Cataract; GERD (gastroesophageal reflux disease); Myocardial infarction [facility]) (2001); CAD (coronary artery disease); HLD (hyperlipidemia); Diverticulitis; Colon polyp; Hypertension; Hemorrhoids; Obesity; DDD (degenerative disc disease), lumbar; Prolapsed uterus; Hyperlipidemia; Mitral regurgitation; Colon polyp; Family history of adverse reaction to anesthesia; COPD (chronic obstructive pulmonary disease) ([place]); Cancer ([place]) (2000); and Breast cancer ([place]) (2001).  has past surgical history that includes Breast surgery (Left);  Eye surgery (Bilateral); [place] surgery; heart bypass; Coronary artery bypass graft (2001); Cardiac catheterization (2001); EUS (N/A, 03/08/2015); Colonoscopy; and laparoscopy (N/A, 04/11/2015). Prior to Admission medications   Medication Sig Start Date End Date Taking? Authorizing Provider  acetaminophen (TYLENOL) 500 MG tablet Take 1,000 mg by mouth 2 (two) times daily as needed for headache.    Yes Historical Provider, MD  aspirin EC 81 MG tablet Take 1 tablet (81 mg total) by mouth daily. 11/08/14  Yes Thayer Headings, MD  budesonide-formoterol [facility]) 160-4.5 MCG/ACT inhaler Inhale 2 puffs into the lungs 2 (two) times daily as needed (for bronchitis). Reported on 03/19/2015   Yes Historical Provider, MD  fentaNYL (DURAGESIC - DOSED MCG/HR) 25 MCG/HR patch Place 1 patch (25 mcg total) onto the skin every 3 (three) days. 04/17/15  Yes Brunetta Genera, MD  HYDROmorphone (DILAUDID) 2 MG tablet Take 1 tablet (2 mg total) by mouth every 4 (four) hours as needed for severe pain. 04/09/15  Yes Brunetta Genera, MD  lidocaine-prilocaine (EMLA) cream Apply to affected area once 04/17/15  Yes Gautam Juleen China, MD  LORazepam (ATIVAN) 0.5 MG tablet Take 1 tablet (0.5 mg total) by mouth every 6 (six) hours as needed (Nausea or vomiting). 04/17/15  Yes Brunetta Genera, MD  metoprolol tartrate (LOPRESSOR) 25 MG tablet Take 0.5 tablets (12.5 mg total) by mouth 2 (two) times daily. 09/28/14  Yes Shawnee Knapp, MD  nitroGLYCERIN (NITROSTAT) 0.4 MG SL tablet Place 0.4 mg under the tongue every 5 (five) minutes as needed for chest pain. Reported on 03/19/2015   Yes Historical Provider, MD  omeprazole (PRILOSEC) 40 MG capsule Take 1 capsule (40 mg total) by mouth 2 (two) times  daily. 01/24/15  Yes Irene Shipper, MD  ondansetron (ZOFRAN) 8 MG tablet Take 1 tablet (8 mg total) by mouth 2 (two) times daily as needed (Nausea or vomiting). 04/17/15  Yes Brunetta Genera, MD  Pancrelipase, Lip-Prot-Amyl, 24000 units CPEP  Take 1 capsule (24,000 Units total) by mouth 3 (three) times daily with meals. 03/22/15  Yes Brunetta Genera, MD  Polyethyl Glycol-Propyl Glycol (SYSTANE OP) Apply 1 drop to eye daily as needed (for dry eyes).   Yes Historical Provider, MD  Polyvinyl Alcohol-Povidone (REFRESH OP) Apply 1 drop to eye daily as needed (for dry eyes).   Yes Historical Provider, MD  senna-docusate (SENNA S) 8.6-50 MG tablet Take 2 tablets by mouth 2 (two) times daily. 04/17/15  Yes Brunetta Genera, MD  dibucaine (NUPERCAINAL) 1 % ointment Apply topically 3 (three) times daily as needed for pain (for hemorrhoidal discomfort.). Patient not taking: Reported on 04/30/2015 04/26/15   Brunetta Genera, MD  prochlorperazine (COMPAZINE) 10 MG tablet Take 1 tablet (10 mg total) by mouth every 6 (six) hours as needed (Nausea or vomiting). Patient not taking: Reported on 04/30/2015 04/17/15   Brunetta Genera, MD   Allergies  Allergen Reactions  . Statins Other (See Comments)    Muscle pain    . Ace Inhibitors     From Previous Records  . Codeine Other (See Comments)    Doesn't feel well   . Levaquin [Levofloxacin In D5w]     From previous records    FAMILY HISTORY:  family history includes Breast cancer (age of onset: 34) in her mother; Esophageal cancer (age of onset: 91) in her daughter; Heart disease in her brother and father; Leukemia (age of onset: 19) in her son. There is no history of Colon cancer or Stomach cancer. SOCIAL HISTORY:  reports that she quit smoking about 15 years ago. Her smoking use included Cigarettes. She quit after 45 years of use. She has never used smokeless tobacco. She reports that she does not drink alcohol or use illicit drugs.  REVIEW OF SYSTEMS:   Constitutional: (+) fever, (-) chills, (+) weight loss, malaise/fatigue and diaphoresis.  HENT: Negative for hearing loss, ear pain, nosebleeds, congestion, sore throat, neck pain, tinnitus and ear discharge.   Eyes: Negative for blurred  vision, double vision, photophobia, pain, discharge and redness.  Respiratory: (+) cough/SOB  Cardiovascular: Negative for chest pain, palpitations, orthopnea, claudication, and PND. (+) edema Gastrointestinal: Negative for heartburn, vomiting, diarrhea, constipation, blood in stool and melena. (+) nausea Genitourinary: Negative for dysuria, urgency, frequency, hematuria and flank pain.  Musculoskeletal: Negative for myalgias, back pain, joint pain and falls.  Skin: Negative for itching and rash.  Neurological: Negative for dizziness, tingling, tremors, sensory change, speech change, focal weakness, seizures, loss of consciousness, (+) weakness and (-) headaches.  Endo/Heme/Allergies: Negative for environmental allergies and polydipsia. Does not bruise/bleed easily.  SUBJECTIVE:  Comfortably laying on the bed. No immediate subjective complaints.  VITAL SIGNS: Temp:  [99.1 F (37.3 C)-100.2 F (37.9 C)] 99.1 F (37.3 C) (03/06 2058) Pulse Rate:  [84-123] 90 (03/07 0230) Resp:  [13-28] 21 (03/07 0230) BP: (71-154)/(34-87) 89/45 mmHg (03/07 0230) SpO2:  [85 %-98 %] 90 % (03/07 0230) Weight:  [141 lb (63.957 kg)] 141 lb (63.957 kg) (03/06 2019)  PHYSICAL EXAMINATION: General:  Chronically ill looking female. Not in distress. Comfortably laying. Neuro:  Cranial nerves grossly intact. No lateralizing signs. HEENT:  PERLA, (-) NVD, (-) LAD, (-) oral thrush seen Cardiovascular:  Good S1 and S2. No S3 murmur rub or gallop. Lungs:  Fair air entry. Bibasilar crackles. No rhonchi or wheeze. Patient has a right port which looks clean. Abdomen:  Soft. (+) BS, distended. No masses or tenderness elicited. Musculoskeletal:  Grade 1 edema. No clubbing or cyanosis. Skin:  Warm and dry. No rash   Recent Labs Lab 04/26/15 0932 04/30/15 1921  NA 134* 138  K 3.5 3.1*  CL  --  98*  CO2 25 27  BUN 9.5 13  CREATININE 0.6 0.81  GLUCOSE 102 107*    Recent Labs Lab 04/26/15 0931 04/30/15 1920    HGB 8.2* 8.6*  HCT 25.8* 28.3*  WBC 8.9 25.3*  PLT 249 303   Dg Chest 2 View  04/30/2015  CLINICAL DATA:  New onset fever. Nausea this morning. Patient on chemotherapy (most recent 5 days prior) for pancreatic cancer. EXAM: CHEST  2 VIEW COMPARISON:  Chest radiographs 04/11/2014, PET-CT 03/29/2015 FINDINGS: Tip of the right chest port in the SVC. Patient is post median sternotomy. Cardiomediastinal contours are unchanged. There is a moderate retrocardiac hiatal hernia. Linear density in the right midlung may be atelectasis, pleural thickening or minimal fluid in the minor fissure. No confluent airspace disease. Minimal blunting of both costophrenic angles, may reflect small pleural effusions. No pneumothorax. Small pulmonary nodules on PET-CT not well seen radiographically. There are surgical clips in the left axilla. IMPRESSION: 1. No evidence of pneumonia. 2. Linear right midlung opacity may be atelectasis, pleural thickening or fluid in the right minor fissure. 3. Question small pleural effusions. Electronically Signed   By: Jeb Levering M.D.   On: 04/30/2015 20:02   Ct Abdomen Pelvis W Contrast  04/30/2015  CLINICAL DATA:  New onset fever and nausea. Abdominal tenderness. Recent diagnosis of pancreatic cancer. Most recent chemotherapy five days prior 04/26/2015. EXAM: CT ABDOMEN AND PELVIS WITH CONTRAST TECHNIQUE: Multidetector CT imaging of the abdomen and pelvis was performed using the standard protocol following bolus administration of intravenous contrast. CONTRAST:  170mL OMNIPAQUE IOHEXOL 300 MG/ML  SOLN COMPARISON:  PET-CT 10/16/2015.  Abdominal CT 02/09/2015 FINDINGS: Lower chest: Tiny bilateral pleural effusions with adjacent atelectasis at the lung bases. Hiatal hernia unchanged in size. Coronary artery calcifications and atherosclerosis of the distal thoracic aorta. Liver: Scattered subcentimeter hypodensities throughout the liver, nonspecific, not significantly changed from prior exam.  Hepatobiliary: Gallbladder physiologically distended, no calcified stone. There is a Phrygian cap. No biliary dilatation. Pancreas: Ill-defined hypodense lesion adjacent to the uncinate process of the pancreas and duodenum has diminished in size currently measuring 2.6 x 1.9 cm, previously 4.0 x 3.5 cm on prior CT. No pancreatic ductal dilatation. Spleen: No focal lesion. Adrenal glands: No nodule. Kidneys: Symmetric renal enhancement. No hydronephrosis. No perinephric stranding. Stomach/Bowel: Stomach is decompressed, hiatal hernia again seen. There are no dilated or thickened small bowel loops. Small volume of stool throughout the colon without colonic wall thickening. The appendix is not visualized. Vascular/Lymphatic: No retroperitoneal adenopathy. Abdominal aorta is normal in caliber. Dense atherosclerosis without aneurysm. Retroperitoneal mass encompasses the superior mesenteric artery as described previously. Omental nodularity and soft tissue density is, with minimal increase in the interim. For example, the anterior omental nodule measures 1.6 cm, previously 1.3 cm. The small bowel mesenteric nodules are difficult to differentiate from mesenteric ascites. Reproductive: Left ovarian mass again seen, 4.3 x 4.0 cm, not significantly changed allowing for differences in caliper placement. Uterus remains in situ. Right ovary not seen. Bladder: Completely decompressed. Other: Moderate volume  of intra-abdominal and pelvic ascites, grossly unchanged to minimally increased from prior PET. Diffuse omental nodularity and soft tissue stranding. No free air. Musculoskeletal: There are no acute or suspicious osseous abnormalities. Degenerative change in the spine. IMPRESSION: 1. Decreased size of the soft tissue mass in the retroperitoneum, likely arising from the uncinate process of the pancreas versus less likely transverse duodenum. There is however increased size of omental nodules from prior PET. Diffuse omental and  peritoneal nodularity again seen. Volume of intra-abdominal and pelvic ascites is unchanged to minimally increased. 2. Small pleural effusions with adjacent atelectasis at the lung bases. 3. Left ovarian lesion, unchanged in the interim. 4. Hiatal hernia again seen. Additional chronic findings as described. Electronically Signed   By: Jeb Levering M.D.   On: 04/30/2015 22:11    ASSESSMENT / PLAN:  41 female, with metastatic pancreatic adenocarcinoma, comes in with feeling unwell over 2 days, low-grade fever, nausea, mild cough. Initially, blood pressure was normal but dropped to 70 systolic. She responded well to fluid boluses. Lab work reveals elevated WBC count. CT scan of the abdomen was negative for any acute infection.  I had a lengthy discussion with the patient regarding her overall prognosis and condition. She understands that she has a terminal disease. She wants to be comfortable. She is a DO NOT RESUSCITATE. I asked her whether she wanted pressors and she said she did not want pressors. I asked her if she wanted hemodialysis and she said she did not want hemodialysis. She is a full DO NOT RESUSCITATE. She wanted to see palliative care team.  Suggest continue with broad-spectrum antibiotics with Zosyn and vancomycin. Suggest panculture with blood culture, urine culture, sputum culture. Continue IV fluids at 75 ML's an hour with D5 half-normal saline.  More importantly, suggest consult palliative care team in the morning.  I discussed the case with the ED physician. Patient will go to hospitalist service. She may end up needing stepdown unit for the first 12-24 hours.  Thank you for the consult. PCCM will sign off for now. Please call back if there's any questions.     Monica Becton, MD Pulmonary and [place] Pager: [phone] After 3 pm or if no answer, call [phone]  05/01/2015, 2:42 AM

## 2015-05-01 NOTE — ED Notes (Signed)
Pt ambulatory to restroom w/o assistance. Nurse walked beside pt.

## 2015-05-01 NOTE — ED Notes (Signed)
Intensivist at bedside.

## 2015-05-01 NOTE — ED Notes (Signed)
Tammy Drought, MD made aware patient's hemoglobin

## 2015-05-01 NOTE — ED Notes (Signed)
MD made aware of patient's blood pressure trend. MD states he will get in touch with intensivist for further management.

## 2015-05-01 NOTE — Progress Notes (Addendum)
[facility] PROGRESS NOTE  Tammy Schmitt [id number] DOB: 02/10/1941 DOA: 04/30/2015 PCP: [name], NP  Brief narrative:    Patient admitted after midnight. For details, please refer to admission note completed 05/01/2015.  75 y.o. female with past medical history off metastatic pancreatic cancer, on palliative chemotherapy.Patient presented to [facility] long ED with generalized weakness, low-grade fevers. She was hypotensive with blood pressure of 71/41, oxygen saturation 85% on room air but this has improved to 98% with nasal cannula oxygen support. T max was 100.2 F. She was also found to have white blood cell count of 25.3, potassium 3.1, hemoglobin 8.6. Urinalysis demonstrated large leukocytes and many bacteria with too numerous to count white blood cells. Patient was started on empiric vancomycin and Zosyn for sepsis secondary to urinary tract infection.   Assessment/Plan:    Principal problem: Septic shock due to urinary tract infection ([place]) - Sepsis criteria met with hypotension, fever, tachycardia, tachypnea, hypoxia, leukocytosis. - Lactic acid within normal limits - Source of infection is urinary tract infection as noted above, urinalysis with large leukocytes and many bacteria - Continue vancomycin and Zosyn while awaiting urine culture results - Patient is DNR/DNI and does not want pressor support - Palliative care assisting with goals of care  Active problems: Anemia of chronic disease / acute blood loss anemia - Anemia likely secondary to history of malignancy, pancreatic cancer - Hemoglobin 6.7 on the admission - Transfused 2 units of PRBC   DVT Prophylaxis  - Stopped heparin subcutaneous and use SCDs because of risk of bleeding and anemia  Code Status: DNR/DNI Family Communication: Family not at the bedside Disposition Plan: Anticipated discharge by 05/04/2015  IV access:  Peripheral IV  Procedures and diagnostic studies:    Dg Chest 2 View  04/30/2015   1. No evidence of pneumonia. 2. Linear right midlung opacity may be atelectasis, pleural thickening or fluid in the right minor fissure. 3. Question small pleural effusions. Electronically Signed   By: Jeb Levering M.D.   On: 04/30/2015 20:02   Ct Abdomen Pelvis W Contrast 04/30/2015  Decreased size of the soft tissue mass in the retroperitoneum, likely arising from the uncinate process of the pancreas versus less likely transverse duodenum. There is however increased size of omental nodules from prior PET. Diffuse omental and peritoneal nodularity again seen. Volume of intra-abdominal and pelvic ascites is unchanged to minimally increased. 2. Small pleural effusions with adjacent atelectasis at the lung bases. 3. Left ovarian lesion, unchanged in the interim. 4. Hiatal hernia again seen. Additional chronic findings as described.   Medical Consultants:  Palliative care   IAnti-Infectives:   Vancomycin and Zosyn 05/01/2015 -->   Leisa Lenz, MD  [facility] Pager [phone]  If 7PM-7AM, please contact night-coverage [email or web address] Password [email or web address] 05/01/2015, 11:22 AM   LOS: 0 days    HPI/Subjective: No acute overnight events.   Objective: Filed Vitals:   05/01/15 0631 05/01/15 0700 05/01/15 0815 05/01/15 1029  BP:  83/40 88/43   Pulse: 82 79 82   Temp:    97.9 F (36.6 C)  TempSrc:    Oral  Resp: '19 17 16   ' Height:      Weight:      SpO2: 88% 89% 93%    No intake or output data in the 24 hours ending 05/01/15 1122  Exam:   General:  Pt is not in acute distress  Cardiovascular: Regular rate and rhythm, S1/S2 (+)  Respiratory: Diminished, no wheezing  Data Reviewed: Basic Metabolic Panel:  Recent Labs Lab 04/26/15 0932 04/30/15 1921 05/01/15 0419  NA 134* 138 135  K 3.5 3.1* 3.2*  CL  --  98* 102  CO2 '25 27 26  ' GLUCOSE 102 107* 113*  BUN 9.'5 13 12  ' CREATININE 0.6 0.81 0.68  CALCIUM 8.7 8.1* 6.9*   Liver Function Tests:  Recent  Labs Lab 04/26/15 0932 04/30/15 1921  AST 34 26  ALT 13 11*  ALKPHOS 98 82  BILITOT 0.38 0.9  PROT 6.1* 6.2*  ALBUMIN 2.3* 2.5*   No results for input(s): LIPASE, AMYLASE in the last 168 hours. No results for input(s): AMMONIA in the last 168 hours. CBC:  Recent Labs Lab 04/26/15 0931 04/30/15 1920 05/01/15 0419  WBC 8.9 25.3* 18.1*  NEUTROABS 7.3* 21.3*  --   HGB 8.2* 8.6* 6.7*  HCT 25.8* 28.3* 22.1*  MCV 80.9 84.7 85.0  PLT 249 303 237   Cardiac Enzymes: No results for input(s): CKTOTAL, CKMB, CKMBINDEX, TROPONINI in the last 168 hours. BNP: Invalid input(s): POCBNP CBG: No results for input(s): GLUCAP in the last 168 hours.  No results found for this or any previous visit (from the past 240 hour(s)).   Scheduled Meds: . aspirin EC  81 mg Oral Daily  . fentaNYL  25 mcg Transdermal Q72H  . heparin  5,000 Units Subcutaneous 3 times per day  . lipase/protease/amylase  24,000 Units Oral TID WC  . mometasone-formoterol  2 puff Inhalation BID  . pantoprazole  80 mg Oral BID  . piperacillin-tazobactam (ZOSYN)  IV  3.375 g Intravenous Q8H  . senna-docusate  2 tablet Oral BID  . vancomycin  500 mg Intravenous Q12H   Continuous Infusions: . sodium chloride 125 mL/hr at 05/01/15 0408

## 2015-05-01 NOTE — ED Notes (Signed)
Per intensivist discontinue levophed.

## 2015-05-01 NOTE — H&P (Addendum)
[facility] History and Physical  Tammy Schmitt [id number] DOB: March 30, 1940 DOA: 04/30/2015  Referring physician: EDP PCP: [name], NP   Chief Complaint: Generalized weakness   HPI: Tammy Schmitt is a 75 y.o. female with metastatic pancreatic cancer, patient is on palliative chemotherapy.  Patient presents to the ED with 2 day history of generalized weakness, low grade fever, fatigue.  In the ED she is septic with WBC 25k, fever 100.2, UTI found on UA.  BP dropped to 70s, pressors started and PCCM was called.  After discussion with patient, patient decided against being on pressors, she wants primarily comfort measures and less invasive medical treatment.  Pressors stopped and BP remains 0000000 systolic.  Review of Systems: Systems reviewed.  As above, otherwise negative  Past Medical History  Diagnosis Date  . Cataract   . GERD (gastroesophageal reflux disease)   . Myocardial infarction ([place]) 2001  . CAD (coronary artery disease)   . HLD (hyperlipidemia)   . Diverticulitis   . Colon polyp   . Hypertension   . Hemorrhoids     external  . Obesity   . DDD (degenerative disc disease), lumbar   . Prolapsed uterus   . Hyperlipidemia   . Mitral regurgitation   . Colon polyp   . Family history of adverse reaction to anesthesia     maternal aunt came out of surgery not knowing what happened and where she was  . COPD (chronic obstructive pulmonary disease) ([place])     Patient denies  . Cancer ([place]) 2000    breast  . Breast cancer ([place]) 2001   Past Surgical History  Procedure Laterality Date  . Breast surgery Left     lymph nodes removed also  . Eye surgery Bilateral     cataracts removed  . Ovary surgery      one  tube removed , one ovary trimmed down  . Heart bypass      x 2, stent  . Coronary artery bypass graft  2001    x 2  . Cardiac catheterization  2001  . Eus N/A 03/08/2015    Procedure: UPPER ENDOSCOPIC ULTRASOUND (EUS) LINEAR;  Surgeon:  Milus Banister, MD;  Location: [facility];  Service: Endoscopy;  Laterality: N/A;  radial linear  . Colonoscopy    . Laparoscopy N/A 04/11/2015    Procedure: LAPAROSCOPY DIAGNOSTIC;  Surgeon: Stark Klein, MD;  Location: [facility];  Service: General;  Laterality: N/A;   Social History:  reports that she quit smoking about 15 years ago. Her smoking use included Cigarettes. She quit after 45 years of use. She has never used smokeless tobacco. She reports that she does not drink alcohol or use illicit drugs.  Allergies  Allergen Reactions  . Statins Other (See Comments)    Muscle pain    . Ace Inhibitors     From Previous Records  . Codeine Other (See Comments)    Doesn't feel well   . Levaquin [Levofloxacin In D5w]     From previous records    Family History  Problem Relation Age of Onset  . Breast cancer Mother 16  . Heart disease Father   . Heart disease Brother   . Esophageal cancer Daughter 48    died at 32  . Leukemia Son 66  . Colon cancer Neg Hx   . Stomach cancer Neg Hx      Prior to Admission medications   Medication Sig Start Date End Date Taking? Authorizing  Provider  acetaminophen (TYLENOL) 500 MG tablet Take 1,000 mg by mouth 2 (two) times daily as needed for headache.    Yes Historical Provider, MD  aspirin EC 81 MG tablet Take 1 tablet (81 mg total) by mouth daily. 11/08/14  Yes Thayer Headings, MD  budesonide-formoterol [facility]) 160-4.5 MCG/ACT inhaler Inhale 2 puffs into the lungs 2 (two) times daily as needed (for bronchitis). Reported on 03/19/2015   Yes Historical Provider, MD  fentaNYL (DURAGESIC - DOSED MCG/HR) 25 MCG/HR patch Place 1 patch (25 mcg total) onto the skin every 3 (three) days. 04/17/15  Yes Brunetta Genera, MD  HYDROmorphone (DILAUDID) 2 MG tablet Take 1 tablet (2 mg total) by mouth every 4 (four) hours as needed for severe pain. 04/09/15  Yes Brunetta Genera, MD  lidocaine-prilocaine (EMLA) cream Apply to affected area once 04/17/15  Yes  Gautam Juleen China, MD  LORazepam (ATIVAN) 0.5 MG tablet Take 1 tablet (0.5 mg total) by mouth every 6 (six) hours as needed (Nausea or vomiting). 04/17/15  Yes Brunetta Genera, MD  metoprolol tartrate (LOPRESSOR) 25 MG tablet Take 0.5 tablets (12.5 mg total) by mouth 2 (two) times daily. 09/28/14  Yes Shawnee Knapp, MD  nitroGLYCERIN (NITROSTAT) 0.4 MG SL tablet Place 0.4 mg under the tongue every 5 (five) minutes as needed for chest pain. Reported on 03/19/2015   Yes Historical Provider, MD  omeprazole (PRILOSEC) 40 MG capsule Take 1 capsule (40 mg total) by mouth 2 (two) times daily. 01/24/15  Yes Irene Shipper, MD  ondansetron (ZOFRAN) 8 MG tablet Take 1 tablet (8 mg total) by mouth 2 (two) times daily as needed (Nausea or vomiting). 04/17/15  Yes Brunetta Genera, MD  Pancrelipase, Lip-Prot-Amyl, 24000 units CPEP Take 1 capsule (24,000 Units total) by mouth 3 (three) times daily with meals. 03/22/15  Yes Brunetta Genera, MD  Polyethyl Glycol-Propyl Glycol (SYSTANE OP) Apply 1 drop to eye daily as needed (for dry eyes).   Yes Historical Provider, MD  Polyvinyl Alcohol-Povidone (REFRESH OP) Apply 1 drop to eye daily as needed (for dry eyes).   Yes Historical Provider, MD  senna-docusate (SENNA S) 8.6-50 MG tablet Take 2 tablets by mouth 2 (two) times daily. 04/17/15  Yes Brunetta Genera, MD  dibucaine (NUPERCAINAL) 1 % ointment Apply topically 3 (three) times daily as needed for pain (for hemorrhoidal discomfort.). Patient not taking: Reported on 04/30/2015 04/26/15   Brunetta Genera, MD  prochlorperazine (COMPAZINE) 10 MG tablet Take 1 tablet (10 mg total) by mouth every 6 (six) hours as needed (Nausea or vomiting). Patient not taking: Reported on 04/30/2015 04/17/15   Brunetta Genera, MD   Physical Exam: Filed Vitals:   05/01/15 0315 05/01/15 0330  BP: 96/47 95/46  Pulse: 93 97  Temp:    Resp: 22 22    BP 95/46 mmHg  Pulse 97  Temp(Src) 99.1 F (37.3 C) (Oral)  Resp 22  Ht 4'  10" (1.473 m)  Wt 63.957 kg (141 lb)  BMI 29.48 kg/m2  SpO2 92%  General Appearance:    Alert, oriented, no distress, appears stated age  Head:    Normocephalic, atraumatic  Eyes:    PERRL, EOMI, sclera non-icteric        Nose:   Nares without drainage or epistaxis. Mucosa, turbinates normal  Throat:   Moist mucous membranes. Oropharynx without erythema or exudate.  Neck:   Supple. No carotid bruits.  No thyromegaly.  No lymphadenopathy.  Back:     No CVA tenderness, no spinal tenderness  Lungs:     Clear to auscultation bilaterally, without wheezes, rhonchi or rales  Chest wall:    No tenderness to palpitation  Heart:    Regular rate and rhythm without murmurs, gallops, rubs  Abdomen:     Soft, non-tender, nondistended, normal bowel sounds, no organomegaly  Genitalia:    deferred  Rectal:    deferred  Extremities:   No clubbing, cyanosis or edema.  Pulses:   2+ and symmetric all extremities  Skin:   Skin color, texture, turgor normal, no rashes or lesions  Lymph nodes:   Cervical, supraclavicular, and axillary nodes normal  Neurologic:   CNII-XII intact. Normal strength, sensation and reflexes      throughout    Labs on Admission:  Basic Metabolic Panel:  Recent Labs Lab 04/26/15 0932 04/30/15 1921  NA 134* 138  K 3.5 3.1*  CL  --  98*  CO2 25 27  GLUCOSE 102 107*  BUN 9.5 13  CREATININE 0.6 0.81  CALCIUM 8.7 8.1*   Liver Function Tests:  Recent Labs Lab 04/26/15 0932 04/30/15 1921  AST 34 26  ALT 13 11*  ALKPHOS 98 82  BILITOT 0.38 0.9  PROT 6.1* 6.2*  ALBUMIN 2.3* 2.5*   No results for input(s): LIPASE, AMYLASE in the last 168 hours. No results for input(s): AMMONIA in the last 168 hours. CBC:  Recent Labs Lab 04/26/15 0931 04/30/15 1920  WBC 8.9 25.3*  NEUTROABS 7.3* 21.3*  HGB 8.2* 8.6*  HCT 25.8* 28.3*  MCV 80.9 84.7  PLT 249 303   Cardiac Enzymes: No results for input(s): CKTOTAL, CKMB, CKMBINDEX, TROPONINI in the last 168 hours.  BNP  (last 3 results) No results for input(s): PROBNP in the last 8760 hours. CBG: No results for input(s): GLUCAP in the last 168 hours.  Radiological Exams on Admission: Dg Chest 2 View  04/30/2015  CLINICAL DATA:  New onset fever. Nausea this morning. Patient on chemotherapy (most recent 5 days prior) for pancreatic cancer. EXAM: CHEST  2 VIEW COMPARISON:  Chest radiographs 04/11/2014, PET-CT 03/29/2015 FINDINGS: Tip of the right chest port in the SVC. Patient is post median sternotomy. Cardiomediastinal contours are unchanged. There is a moderate retrocardiac hiatal hernia. Linear density in the right midlung may be atelectasis, pleural thickening or minimal fluid in the minor fissure. No confluent airspace disease. Minimal blunting of both costophrenic angles, may reflect small pleural effusions. No pneumothorax. Small pulmonary nodules on PET-CT not well seen radiographically. There are surgical clips in the left axilla. IMPRESSION: 1. No evidence of pneumonia. 2. Linear right midlung opacity may be atelectasis, pleural thickening or fluid in the right minor fissure. 3. Question small pleural effusions. Electronically Signed   By: Jeb Levering M.D.   On: 04/30/2015 20:02   Ct Abdomen Pelvis W Contrast  04/30/2015  CLINICAL DATA:  New onset fever and nausea. Abdominal tenderness. Recent diagnosis of pancreatic cancer. Most recent chemotherapy five days prior 04/26/2015. EXAM: CT ABDOMEN AND PELVIS WITH CONTRAST TECHNIQUE: Multidetector CT imaging of the abdomen and pelvis was performed using the standard protocol following bolus administration of intravenous contrast. CONTRAST:  141mL OMNIPAQUE IOHEXOL 300 MG/ML  SOLN COMPARISON:  PET-CT 10/16/2015.  Abdominal CT 02/09/2015 FINDINGS: Lower chest: Tiny bilateral pleural effusions with adjacent atelectasis at the lung bases. Hiatal hernia unchanged in size. Coronary artery calcifications and atherosclerosis of the distal thoracic aorta. Liver: Scattered  subcentimeter hypodensities throughout the  liver, nonspecific, not significantly changed from prior exam. Hepatobiliary: Gallbladder physiologically distended, no calcified stone. There is a Phrygian cap. No biliary dilatation. Pancreas: Ill-defined hypodense lesion adjacent to the uncinate process of the pancreas and duodenum has diminished in size currently measuring 2.6 x 1.9 cm, previously 4.0 x 3.5 cm on prior CT. No pancreatic ductal dilatation. Spleen: No focal lesion. Adrenal glands: No nodule. Kidneys: Symmetric renal enhancement. No hydronephrosis. No perinephric stranding. Stomach/Bowel: Stomach is decompressed, hiatal hernia again seen. There are no dilated or thickened small bowel loops. Small volume of stool throughout the colon without colonic wall thickening. The appendix is not visualized. Vascular/Lymphatic: No retroperitoneal adenopathy. Abdominal aorta is normal in caliber. Dense atherosclerosis without aneurysm. Retroperitoneal mass encompasses the superior mesenteric artery as described previously. Omental nodularity and soft tissue density is, with minimal increase in the interim. For example, the anterior omental nodule measures 1.6 cm, previously 1.3 cm. The small bowel mesenteric nodules are difficult to differentiate from mesenteric ascites. Reproductive: Left ovarian mass again seen, 4.3 x 4.0 cm, not significantly changed allowing for differences in caliper placement. Uterus remains in situ. Right ovary not seen. Bladder: Completely decompressed. Other: Moderate volume of intra-abdominal and pelvic ascites, grossly unchanged to minimally increased from prior PET. Diffuse omental nodularity and soft tissue stranding. No free air. Musculoskeletal: There are no acute or suspicious osseous abnormalities. Degenerative change in the spine. IMPRESSION: 1. Decreased size of the soft tissue mass in the retroperitoneum, likely arising from the uncinate process of the pancreas versus less likely  transverse duodenum. There is however increased size of omental nodules from prior PET. Diffuse omental and peritoneal nodularity again seen. Volume of intra-abdominal and pelvic ascites is unchanged to minimally increased. 2. Small pleural effusions with adjacent atelectasis at the lung bases. 3. Left ovarian lesion, unchanged in the interim. 4. Hiatal hernia again seen. Additional chronic findings as described. Electronically Signed   By: Jeb Levering M.D.   On: 04/30/2015 22:11    EKG: Independently reviewed.  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 Septic shock due to UTI - 1. IVF 4L resuscitation in ED and 125 cc/hr 2. Holding all home BP meds 3. Rocephin 4. Urine culture and BCx pending 5. No pressors or heroic measures per patient. 6. Repeat CBC and BMP in AM 7. Tylenol PRN fever 2. Metastatic pancreatic cancer - 1. Terminal cancer, patient wants primary goal of care to be comfort 2. She does specifically request to try some antibiotics for now to see if she can beat this infection 3. She states she understands that even in this case the prognosis is poor. 4. Likely would benefit from palliative care consultation. 5. Continue home pain meds for now, she says she is comfortable and not in pain.    Code Status: DNR, DNI, goals of care = comfort.  Still doing some medical treatment. Family Communication: No family in room Disposition Plan: Admit to inpatient   Time spent: 70 min  GARDNER, JARED M. [facility] Pager [phone]  If 7AM-7PM, please contact the day team taking care of the patient [email or web address] Password [facility] 05/01/2015, 3:44 AM

## 2015-05-02 DIAGNOSIS — D638 Anemia in other chronic diseases classified elsewhere: Secondary | ICD-10-CM

## 2015-05-02 DIAGNOSIS — N390 Urinary tract infection, site not specified: Secondary | ICD-10-CM

## 2015-05-02 DIAGNOSIS — R6510 Systemic inflammatory response syndrome (SIRS) of non-infectious origin without acute organ dysfunction: Secondary | ICD-10-CM | POA: Insufficient documentation

## 2015-05-02 DIAGNOSIS — E876 Hypokalemia: Secondary | ICD-10-CM

## 2015-05-02 DIAGNOSIS — E860 Dehydration: Secondary | ICD-10-CM

## 2015-05-02 DIAGNOSIS — R509 Fever, unspecified: Secondary | ICD-10-CM

## 2015-05-02 DIAGNOSIS — R6521 Severe sepsis with septic shock: Secondary | ICD-10-CM

## 2015-05-02 DIAGNOSIS — I10 Essential (primary) hypertension: Secondary | ICD-10-CM

## 2015-05-02 DIAGNOSIS — R0902 Hypoxemia: Secondary | ICD-10-CM

## 2015-05-02 LAB — TYPE AND SCREEN
ABO/RH(D): O POS
Antibody Screen: NEGATIVE
UNIT DIVISION: 0
Unit division: 0

## 2015-05-02 LAB — CBC
HCT: 33.5 % — ABNORMAL LOW (ref 36.0–46.0)
HEMOGLOBIN: 10.3 g/dL — AB (ref 12.0–15.0)
MCH: 26.1 pg (ref 26.0–34.0)
MCHC: 30.7 g/dL (ref 30.0–36.0)
MCV: 85 fL (ref 78.0–100.0)
Platelets: 381 10*3/uL (ref 150–400)
RBC: 3.94 MIL/uL (ref 3.87–5.11)
RDW: 17 % — ABNORMAL HIGH (ref 11.5–15.5)
WBC: 13.2 10*3/uL — AB (ref 4.0–10.5)

## 2015-05-02 LAB — BASIC METABOLIC PANEL
ANION GAP: 10 (ref 5–15)
BUN: 8 mg/dL (ref 6–20)
CHLORIDE: 105 mmol/L (ref 101–111)
CO2: 26 mmol/L (ref 22–32)
Calcium: 7.2 mg/dL — ABNORMAL LOW (ref 8.9–10.3)
Creatinine, Ser: 0.55 mg/dL (ref 0.44–1.00)
GFR calc Af Amer: >60 mL/min (ref >60)
Glucose, Bld: 93 mg/dL (ref 65–99)
POTASSIUM: 2.9 mmol/L — AB (ref 3.5–5.1)
SODIUM: 141 mmol/L (ref 135–145)

## 2015-05-02 LAB — URINE CULTURE: Culture: NO GROWTH

## 2015-05-02 MED ORDER — ENSURE ENLIVE PO LIQD
237.0000 mL | Freq: Three times a day (TID) | ORAL | Status: DC
  Administered 2015-05-02 – 2015-05-04 (×2): 237 mL via ORAL

## 2015-05-02 MED ORDER — SODIUM CHLORIDE 0.9% FLUSH
10.0000 mL | INTRAVENOUS | Status: DC | PRN
  Administered 2015-05-05: 10 mL
  Filled 2015-05-02: qty 40

## 2015-05-02 MED ORDER — SODIUM CHLORIDE 0.9% FLUSH
10.0000 mL | Freq: Two times a day (BID) | INTRAVENOUS | Status: DC
  Administered 2015-05-02 – 2015-05-05 (×5): 10 mL

## 2015-05-02 MED ORDER — POTASSIUM CHLORIDE IN NACL 40-0.9 MEQ/L-% IV SOLN
INTRAVENOUS | Status: DC
  Administered 2015-05-02: 125 mL/h via INTRAVENOUS
  Filled 2015-05-02 (×2): qty 1000

## 2015-05-02 MED ORDER — POTASSIUM CHLORIDE CRYS ER 20 MEQ PO TBCR
40.0000 meq | EXTENDED_RELEASE_TABLET | Freq: Two times a day (BID) | ORAL | Status: AC
  Administered 2015-05-02 – 2015-05-03 (×3): 40 meq via ORAL
  Filled 2015-05-02 (×4): qty 2

## 2015-05-02 NOTE — Progress Notes (Signed)
Initial Nutrition Assessment  DOCUMENTATION CODES:   Severe malnutrition in context of chronic illness  INTERVENTION:   - Provide Ensure Enlive po TID, each supplement provides 350 kcal and 20 grams of protein. - Encourage good po intake.  NUTRITION DIAGNOSIS:   Malnutrition related to chronic illness as evidenced by percent weight loss, energy intake < or equal to 75% for > or equal to 1 month, moderate depletions of muscle mass, moderate depletion of body fat.  GOAL:   Patient will meet greater than or equal to 90% of their needs  MONITOR:   PO intake, Supplement acceptance, Weight trends, Labs  REASON FOR ASSESSMENT:   Malnutrition Screening Tool    ASSESSMENT:   75 y.o. female with metastatic pancreatic cancer, on palliative chemotherapy. Presents with 2 day hx of generalized weakness, low grade fever, and fatigue.   Patient reports a poor appetite with occasional nausea since her cancer diagnosis in December 2016.  States that this past week she has been forcing herself to eat more throughout the day because she has noticed that she has lost some weight.  States that she is unable to drive so she relies on family for groceries. Patient reports that she did not eat breakfast this am since she felt bloated. States that she has been trying different Ensure supplements to determine which one she liked, pt amenable to trying Ensure Enlive while in hospital.  Dietetic Intern to order.  Nutrition Focused Physical Exam was conducted.  Findings include moderate fat depletion, moderate muscle depletion, and no edema.  Per chart review, patient has lost 16 lbs since Nov 2016, representing a 10% weight loss x 4 month, significant for this time frame.  Medications reviewed: lipase/protease/amylase capsule, K-Dur, IV potassium.  Labs reviewed: low potassium (3.2).  Diet Order:  Diet regular Room service appropriate?: Yes; Fluid consistency:: Thin  Skin:  Reviewed, no issues  Last  BM:  3/7  Height:   Ht Readings from Last 1 Encounters:  04/30/15 4\' 10"  (1.473 m)    Weight:   Wt Readings from Last 1 Encounters:  04/30/15 141 lb (63.957 kg)    Ideal Body Weight:  43.9 kg  BMI:  Body mass index is 29.48 kg/(m^2).  Estimated Nutritional Needs:   Kcal:  1600-1800  Protein:  85-95 grams  Fluid:  1.6 - 1.8 L  EDUCATION NEEDS:   No education needs identified at this time  Veronda Prude, Dietetic Intern Pager: [phone]

## 2015-05-02 NOTE — Progress Notes (Signed)
PROGRESS NOTE    Tammy Schmitt  WJX:[id number]  DOB: 1940/12/26  DOA: 04/30/2015 PCP: [name], NP Outpatient Specialists:   Hospital course: 75 y.o. female with metastatic pancreatic cancer, patient is on palliative chemotherapy. Patient presents to the ED with 2 day history of generalized weakness, low grade fever, fatigue. In the ED she is septic with WBC 25k, fever 100.2, UTI found on UA. BP dropped to 70s, pressors started and PCCM was called. She has PMH of CAD, HLD, HTN.  After discussion with patient, patient decided against being on pressors, she wants primarily comfort measures and less invasive medical treatment. Pressors stopped and BP remained 21H systolic. She was admitted for sepsis secondary to UTI.  Assessment & Plan:   SIRS - Patient met sepsis criteria on admission. Source of sepsis was felt to be due to UTI. She was briefly started on vasopressors for hypotension but patient declined pressors.  -  she was empirically started on IV vancomycin and Zosyn pending urine culture results. Hydrated with IV fluids.  - Blood cultures 2: Negative to date. Urine culture times one: Negative.  - SIRS physiology has resolved. Unclear source of her SIRS - Chest x-ray without pneumonia. CT abdomen without acute findings but did show pancreatic malignancy and some ascites. - Check flu panel PCR. - Continue broad-spectrum antibiotics overnight and need to determine course of action in a.m.   Anemia of chronic disease - Hemoglobin 6.7 on 05/01/15 which may have been related to hemodilution rather than true drop in hemoglobin. She however received 2 units of PRBC and her hemoglobin has improved to 10.3. - Anemia secondary to metastatic malignancy and chemotherapy.  Dehydration - Resolved. Discontinue IV fluids. This may have been primary cause for her SIRS presentation.   Hypotension - May have been from dehydration. Resolved.  Hypokalemia - Replace and follow.    Metastatic pancreatic cancer - Oncology follow-up appreciated. They plan to hold chemotherapy for now.   DVT prophylaxis:Subcutaneous heparin Code Status: DO NOT RESUSCITATE Family Communication: None at bedside  Disposition Plan: DC home when medically stable, possibly in the next 2-3 days.    Consultants:  CCM-signed off  Oncology  Procedures:  None   Antimicrobials:  IV Zosyn 3/6 >  IV vancomycin 3/6 >  Subjective: Feels better. Feels stronger. Denies any other specific complaints. No dyspnea or pain reported.   Objective: Filed Vitals:   05/01/15 2130 05/01/15 2350 05/02/15 0522 05/02/15 1309  BP: 109/67 103/49 136/63 125/65  Pulse: 100 98 105 90  Temp: 99.1 F (37.3 C) 98.9 F (37.2 C) 99.1 F (37.3 C) 98.2 F (36.8 C)  TempSrc: Oral Oral Oral Oral  Resp: '16 16 16 18  ' Height:      Weight:      SpO2: 93% 93% 94% 93%    Intake/Output Summary (Last 24 hours) at 05/02/15 1855 Last data filed at 05/02/15 1833  Gross per 24 hour  Intake 3248.12 ml  Output      0 ml  Net 3248.12 ml   Filed Weights   04/30/15 1859 04/30/15 2019  Weight: 63.957 kg (141 lb) 63.957 kg (141 lb)    Exam:  General exam: Pleasant elderly female sitting up comfortably in chair this morning. Did not appear septic or toxic.  Respiratory system: Clear. No increased work of breathing. Cardiovascular system: S1 & S2 heard, RRR. No JVD, murmurs, gallops, clicks or pedal edema. Gastrointestinal system: Abdomen is nondistended, soft and nontender. Normal bowel sounds heard.  Central nervous system: Alert and oriented. No focal neurological deficits. Extremities: Symmetric 5 x 5 power.   Data Reviewed: Basic Metabolic Panel:  Recent Labs Lab 04/26/15 0932 04/30/15 1921 05/01/15 0419 05/02/15 1145  NA 134* 138 135 141  K 3.5 3.1* 3.2* 2.9*  CL  --  98* 102 105  CO2 '25 27 26 26  ' GLUCOSE 102 107* 113* 93  BUN 9.'5 13 12 8  ' CREATININE 0.6 0.81 0.68 0.55  CALCIUM 8.7 8.1*  6.9* 7.2*   Liver Function Tests:  Recent Labs Lab 04/26/15 0932 04/30/15 1921  AST 34 26  ALT 13 11*  ALKPHOS 98 82  BILITOT 0.38 0.9  PROT 6.1* 6.2*  ALBUMIN 2.3* 2.5*   No results for input(s): LIPASE, AMYLASE in the last 168 hours. No results for input(s): AMMONIA in the last 168 hours. CBC:  Recent Labs Lab 04/26/15 0931 04/30/15 1920 05/01/15 0419 05/02/15 1145  WBC 8.9 25.3* 18.1* 13.2*  NEUTROABS 7.3* 21.3*  --   --   HGB 8.2* 8.6* 6.7* 10.3*  HCT 25.8* 28.3* 22.1* 33.5*  MCV 80.9 84.7 85.0 85.0  PLT 249 303 237 381   Cardiac Enzymes: No results for input(s): CKTOTAL, CKMB, CKMBINDEX, TROPONINI in the last 168 hours. BNP (last 3 results) No results for input(s): PROBNP in the last 8760 hours. CBG: No results for input(s): GLUCAP in the last 168 hours.  Recent Results (from the past 240 hour(s))  Culture, blood (routine x 2)     Status: None (Preliminary result)   Collection Time: 04/30/15  7:20 PM  Result Value Ref Range Status   Specimen Description BLOOD RIGHT HAND  Final   Special Requests   Final    BOTTLES DRAWN AEROBIC AND ANAEROBIC 5CC BOTH BOTTLES   Culture   Final    NO GROWTH 1 DAY Performed at [facility]    Report Status PENDING  Incomplete  Urine culture     Status: None   Collection Time: 04/30/15  8:19 PM  Result Value Ref Range Status   Specimen Description URINE, CATHETERIZED  Final   Special Requests NONE  Final   Culture   Final    NO GROWTH 1 DAY Performed at [facility]    Report Status 05/02/2015 FINAL  Final  Culture, blood (routine x 2)     Status: None (Preliminary result)   Collection Time: 04/30/15  8:35 PM  Result Value Ref Range Status   Specimen Description BLOOD PORTA CATH  Final   Special Requests BOTTLES DRAWN AEROBIC AND ANAEROBIC 5 ML  Final   Culture   Final    NO GROWTH 1 DAY Performed at [facility]    Report Status PENDING  Incomplete         Studies: Dg Chest 2  View  04/30/2015  CLINICAL DATA:  New onset fever. Nausea this morning. Patient on chemotherapy (most recent 5 days prior) for pancreatic cancer. EXAM: CHEST  2 VIEW COMPARISON:  Chest radiographs 04/11/2014, PET-CT 03/29/2015 FINDINGS: Tip of the right chest port in the SVC. Patient is post median sternotomy. Cardiomediastinal contours are unchanged. There is a moderate retrocardiac hiatal hernia. Linear density in the right midlung may be atelectasis, pleural thickening or minimal fluid in the minor fissure. No confluent airspace disease. Minimal blunting of both costophrenic angles, may reflect small pleural effusions. No pneumothorax. Small pulmonary nodules on PET-CT not well seen radiographically. There are surgical clips in the left axilla. IMPRESSION: 1.  No evidence of pneumonia. 2. Linear right midlung opacity may be atelectasis, pleural thickening or fluid in the right minor fissure. 3. Question small pleural effusions. Electronically Signed   By: Jeb Levering M.D.   On: 04/30/2015 20:02   Ct Abdomen Pelvis W Contrast  04/30/2015  CLINICAL DATA:  New onset fever and nausea. Abdominal tenderness. Recent diagnosis of pancreatic cancer. Most recent chemotherapy five days prior 04/26/2015. EXAM: CT ABDOMEN AND PELVIS WITH CONTRAST TECHNIQUE: Multidetector CT imaging of the abdomen and pelvis was performed using the standard protocol following bolus administration of intravenous contrast. CONTRAST:  164m OMNIPAQUE IOHEXOL 300 MG/ML  SOLN COMPARISON:  PET-CT 10/16/2015.  Abdominal CT 02/09/2015 FINDINGS: Lower chest: Tiny bilateral pleural effusions with adjacent atelectasis at the lung bases. Hiatal hernia unchanged in size. Coronary artery calcifications and atherosclerosis of the distal thoracic aorta. Liver: Scattered subcentimeter hypodensities throughout the liver, nonspecific, not significantly changed from prior exam. Hepatobiliary: Gallbladder physiologically distended, no calcified stone. There  is a Phrygian cap. No biliary dilatation. Pancreas: Ill-defined hypodense lesion adjacent to the uncinate process of the pancreas and duodenum has diminished in size currently measuring 2.6 x 1.9 cm, previously 4.0 x 3.5 cm on prior CT. No pancreatic ductal dilatation. Spleen: No focal lesion. Adrenal glands: No nodule. Kidneys: Symmetric renal enhancement. No hydronephrosis. No perinephric stranding. Stomach/Bowel: Stomach is decompressed, hiatal hernia again seen. There are no dilated or thickened small bowel loops. Small volume of stool throughout the colon without colonic wall thickening. The appendix is not visualized. Vascular/Lymphatic: No retroperitoneal adenopathy. Abdominal aorta is normal in caliber. Dense atherosclerosis without aneurysm. Retroperitoneal mass encompasses the superior mesenteric artery as described previously. Omental nodularity and soft tissue density is, with minimal increase in the interim. For example, the anterior omental nodule measures 1.6 cm, previously 1.3 cm. The small bowel mesenteric nodules are difficult to differentiate from mesenteric ascites. Reproductive: Left ovarian mass again seen, 4.3 x 4.0 cm, not significantly changed allowing for differences in caliper placement. Uterus remains in situ. Right ovary not seen. Bladder: Completely decompressed. Other: Moderate volume of intra-abdominal and pelvic ascites, grossly unchanged to minimally increased from prior PET. Diffuse omental nodularity and soft tissue stranding. No free air. Musculoskeletal: There are no acute or suspicious osseous abnormalities. Degenerative change in the spine. IMPRESSION: 1. Decreased size of the soft tissue mass in the retroperitoneum, likely arising from the uncinate process of the pancreas versus less likely transverse duodenum. There is however increased size of omental nodules from prior PET. Diffuse omental and peritoneal nodularity again seen. Volume of intra-abdominal and pelvic ascites  is unchanged to minimally increased. 2. Small pleural effusions with adjacent atelectasis at the lung bases. 3. Left ovarian lesion, unchanged in the interim. 4. Hiatal hernia again seen. Additional chronic findings as described. Electronically Signed   By: MJeb LeveringM.D.   On: 04/30/2015 22:11        Scheduled Meds: . aspirin EC  81 mg Oral Daily  . feeding supplement (ENSURE ENLIVE)  237 mL Oral TID WC  . fentaNYL  25 mcg Transdermal Q72H  . heparin  5,000 Units Subcutaneous 3 times per day  . lipase/protease/amylase  24,000 Units Oral TID WC  . mometasone-formoterol  2 puff Inhalation BID  . pantoprazole  80 mg Oral BID  . piperacillin-tazobactam (ZOSYN)  IV  3.375 g Intravenous Q8H  . potassium chloride  40 mEq Oral BID  . senna-docusate  2 tablet Oral BID  . 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    Vernell Leep, MD, FACP, FHM. [facility] Pager [phone]  If 7PM-7AM, please contact night-coverage [email or web address] Password [email or web address] 05/02/2015, 6:55 PM    LOS: 1 day

## 2015-05-02 NOTE — Progress Notes (Signed)
Tammy Kitchen   HEMATOLOGY/ONCOLOGY INPATIENT PROGRESS NOTE  Date of Service: 05/02/2015  Inpatient Attending: .Modena Jansky, MD   SUBJECTIVE  [name] is my patient from oncology clinic recently started treatment for metastatic pancreatic cancer. She received her first cycle of gemcitabine and Abraxane chemotherapy on 04/19/2015. She was admitted yesterday with generalized weakness and low-grade fevers and was noted to be hypotensive with a blood pressure of 71/41 and an oxygen saturation of 85% on room air. Noted to have an elevated WBC count of 25.3k , hemoglobin of 8.6 that dropped to 6.7 (? Hemodiluted labs given bump after transfusion), evidence of a urinary tract infection, hypokalemia. Blood cultures and urine cultures are currently pending. Patient will start on broad-spectrum antibiotics. She has received 2 units of PRBC with improvement in hemoglobin to 10.3. Patient notes that she had started eating quite well in the week before admission. She was due for her next cycle of chemotherapy on 05/03/2015 but this still be dealing in the setting of her infection. Patient notes she is feeling a little better today but still complains of some mild dizziness. Has had significant hypokalemia which is getting replaced aggressively. Significant history in IV fluids. Notes her abdominal pain is been mostly well-controlled.      OBJECTIVE:  NAD  PHYSICAL EXAMINATION: . Filed Vitals:   05/01/15 2130 05/01/15 2350 05/02/15 0522 05/02/15 1309  BP: 109/67 103/49 136/63 125/65  Pulse: 100 98 105 90  Temp: 99.1 F (37.3 C) 98.9 F (37.2 C) 99.1 F (37.3 C) 98.2 F (36.8 C)  TempSrc: Oral Oral Oral Oral  Resp: 16 16 16 18   Height:      Weight:      SpO2: 93% 93% 94% 93%   Filed Weights   04/30/15 1859 04/30/15 2019  Weight: 141 lb (63.957 kg) 141 lb (63.957 kg)   .Body mass index is 29.48 kg/(m^2).  GENERAL:alert, in no acute distress and comfortable SKIN: skin color, texture,  turgor are normal, no rashes or significant lesions EYES: normal, conjunctiva are pink and non-injected, sclera clear OROPHARYNX:no exudate, no erythema and lips, buccal mucosa, and tongue normal  NECK: supple, no JVD, thyroid normal size, non-tender, without nodularity LYMPH:  no palpable lymphadenopathy in the cervical, axillary or inguinal LUNGS: clear to auscultation with normal respiratory effort HEART: regular rate & rhythm,  no murmurs and no lower extremity edema ABDOMEN: abdomen soft, non-tender, normoactive bowel sounds  Musculoskeletal: no cyanosis of digits and no clubbing  PSYCH: alert & oriented x 3 with fluent speech NEURO: no focal motor/sensory deficits  MEDICAL HISTORY:  History reviewed. No pertinent past medical history.  SURGICAL HISTORY: Past Surgical History  Procedure Laterality Date  . Breast surgery Left     lymph nodes removed also  . Eye surgery Bilateral     cataracts removed  . Ovary surgery      one  tube removed , one ovary trimmed down  . Heart bypass      x 2, stent  . Coronary artery bypass graft  2001    x 2  . Cardiac catheterization  2001  . Eus N/A 03/08/2015    Procedure: UPPER ENDOSCOPIC ULTRASOUND (EUS) LINEAR;  Surgeon: Milus Banister, MD;  Location: [facility];  Service: Endoscopy;  Laterality: N/A;  radial linear  . Colonoscopy    . Laparoscopy N/A 04/11/2015    Procedure: LAPAROSCOPY DIAGNOSTIC;  Surgeon: Stark Klein, MD;  Location: [facility];  Service: General;  Laterality: N/A;  SOCIAL HISTORY: Social History   Social History  . Marital Status: Divorced    Spouse Name: N/A  . Number of Children: 3  . Years of Education: N/A   Occupational History  . retired    Social History Main Topics  . Smoking status: Former Smoker -- 45 years    Types: Cigarettes    Quit date: 12/26/1999  . Smokeless tobacco: Never Used  . Alcohol Use: No  . Drug Use: No  . Sexual Activity: Not on file   Other Topics Concern  . Not on file     Social History Narrative   Retired from working with disabled individuals    Two sons and one daughter (daughter and one son have [place]   Divorced, lives alone; moved to [place] from [place] May 2016   She likes to Psychologist, occupational and exercise.     FAMILY HISTORY: Family History  Problem Relation Age of Onset  . Breast cancer Mother 77  . Heart disease Father   . Heart disease Brother   . Esophageal cancer Daughter 57    died at 76  . Leukemia Son 86  . Colon cancer Neg Hx   . Stomach cancer Neg Hx     ALLERGIES:  is allergic to statins; ace inhibitors; codeine; and levaquin.  MEDICATIONS:  Scheduled Meds: . aspirin EC  81 mg Oral Daily  . fentaNYL  25 mcg Transdermal Q72H  . heparin  5,000 Units Subcutaneous 3 times per day  . lipase/protease/amylase  24,000 Units Oral TID WC  . mometasone-formoterol  2 puff Inhalation BID  . pantoprazole  80 mg Oral BID  . piperacillin-tazobactam (ZOSYN)  IV  3.375 g Intravenous Q8H  . potassium chloride  40 mEq Oral BID  . senna-docusate  2 tablet Oral BID  . vancomycin  500 mg Intravenous Q12H   Continuous Infusions: . 0.9 % NaCl with KCl 40 mEq / L     PRN Meds:.acetaminophen, artificial tears, HYDROmorphone, LORazepam, ondansetron, polyvinyl alcohol  REVIEW OF SYSTEMS:    10 Point review of Systems was done is negative except as noted above.   LABORATORY DATA:  I have reviewed the data as listed  . CBC Latest Ref Rng 05/02/2015 05/01/2015 04/30/2015  WBC 4.0 - 10.5 K/uL 13.2(H) 18.1(H) 25.3(H)  Hemoglobin 12.0 - 15.0 g/dL 10.3(L) 6.7(LL) 8.6(L)  Hematocrit 36.0 - 46.0 % 33.5(L) 22.1(L) 28.3(L)  Platelets 150 - 400 K/uL 381 237 303    . CMP Latest Ref Rng 05/02/2015 05/01/2015 04/30/2015  Glucose 65 - 99 mg/dL 93 113(H) 107(H)  BUN 6 - 20 mg/dL 8 12 13   Creatinine 0.44 - 1.00 mg/dL 0.55 0.68 0.81  Sodium 135 - 145 mmol/L 141 135 138  Potassium 3.5 - 5.1 mmol/L 2.9(L) 3.2(L) 3.1(L)  Chloride 101 - 111 mmol/L 105 102 98(L)  CO2 22 - 32  mmol/L 26 26 27   Calcium 8.9 - 10.3 mg/dL 7.2(L) 6.9(L) 8.1(L)  Total Protein 6.5 - 8.1 g/dL - - 6.2(L)  Total Bilirubin 0.3 - 1.2 mg/dL - - 0.9  Alkaline Phos 38 - 126 U/L - - 82  AST 15 - 41 U/L - - 26  ALT 14 - 54 U/L - - 11(L)     RADIOGRAPHIC STUDIES: I have personally reviewed the radiological images as listed and agreed with the findings in the report. Dg Chest 2 View  04/30/2015  CLINICAL DATA:  New onset fever. Nausea this morning. Patient on chemotherapy (most recent 5 days prior)  for pancreatic cancer. EXAM: CHEST  2 VIEW COMPARISON:  Chest radiographs 04/11/2014, PET-CT 03/29/2015 FINDINGS: Tip of the right chest port in the SVC. Patient is post median sternotomy. Cardiomediastinal contours are unchanged. There is a moderate retrocardiac hiatal hernia. Linear density in the right midlung may be atelectasis, pleural thickening or minimal fluid in the minor fissure. No confluent airspace disease. Minimal blunting of both costophrenic angles, may reflect small pleural effusions. No pneumothorax. Small pulmonary nodules on PET-CT not well seen radiographically. There are surgical clips in the left axilla. IMPRESSION: 1. No evidence of pneumonia. 2. Linear right midlung opacity may be atelectasis, pleural thickening or fluid in the right minor fissure. 3. Question small pleural effusions. Electronically Signed   By: Jeb Levering M.D.   On: 04/30/2015 20:02   Ct Abdomen Pelvis W Contrast  04/30/2015  CLINICAL DATA:  New onset fever and nausea. Abdominal tenderness. Recent diagnosis of pancreatic cancer. Most recent chemotherapy five days prior 04/26/2015. EXAM: CT ABDOMEN AND PELVIS WITH CONTRAST TECHNIQUE: Multidetector CT imaging of the abdomen and pelvis was performed using the standard protocol following bolus administration of intravenous contrast. CONTRAST:  163mL OMNIPAQUE IOHEXOL 300 MG/ML  SOLN COMPARISON:  PET-CT 10/16/2015.  Abdominal CT 02/09/2015 FINDINGS: Lower chest: Tiny  bilateral pleural effusions with adjacent atelectasis at the lung bases. Hiatal hernia unchanged in size. Coronary artery calcifications and atherosclerosis of the distal thoracic aorta. Liver: Scattered subcentimeter hypodensities throughout the liver, nonspecific, not significantly changed from prior exam. Hepatobiliary: Gallbladder physiologically distended, no calcified stone. There is a Phrygian cap. No biliary dilatation. Pancreas: Ill-defined hypodense lesion adjacent to the uncinate process of the pancreas and duodenum has diminished in size currently measuring 2.6 x 1.9 cm, previously 4.0 x 3.5 cm on prior CT. No pancreatic ductal dilatation. Spleen: No focal lesion. Adrenal glands: No nodule. Kidneys: Symmetric renal enhancement. No hydronephrosis. No perinephric stranding. Stomach/Bowel: Stomach is decompressed, hiatal hernia again seen. There are no dilated or thickened small bowel loops. Small volume of stool throughout the colon without colonic wall thickening. The appendix is not visualized. Vascular/Lymphatic: No retroperitoneal adenopathy. Abdominal aorta is normal in caliber. Dense atherosclerosis without aneurysm. Retroperitoneal mass encompasses the superior mesenteric artery as described previously. Omental nodularity and soft tissue density is, with minimal increase in the interim. For example, the anterior omental nodule measures 1.6 cm, previously 1.3 cm. The small bowel mesenteric nodules are difficult to differentiate from mesenteric ascites. Reproductive: Left ovarian mass again seen, 4.3 x 4.0 cm, not significantly changed allowing for differences in caliper placement. Uterus remains in situ. Right ovary not seen. Bladder: Completely decompressed. Other: Moderate volume of intra-abdominal and pelvic ascites, grossly unchanged to minimally increased from prior PET. Diffuse omental nodularity and soft tissue stranding. No free air. Musculoskeletal: There are no acute or suspicious osseous  abnormalities. Degenerative change in the spine. IMPRESSION: 1. Decreased size of the soft tissue mass in the retroperitoneum, likely arising from the uncinate process of the pancreas versus less likely transverse duodenum. There is however increased size of omental nodules from prior PET. Diffuse omental and peritoneal nodularity again seen. Volume of intra-abdominal and pelvic ascites is unchanged to minimally increased. 2. Small pleural effusions with adjacent atelectasis at the lung bases. 3. Left ovarian lesion, unchanged in the interim. 4. Hiatal hernia again seen. Additional chronic findings as described. Electronically Signed   By: Jeb Levering M.D.   On: 04/30/2015 22:11   Ir Fluoro Guide Cv Line Right  04/26/2015  ADDENDUM REPORT:  04/26/2015 09:54 ADDENDUM: Nursing monitored the patient during sedation. Electronically Signed   By: Marybelle Killings M.D.   On: 04/26/2015 09:54  04/26/2015  CLINICAL DATA:  Pancreatic cancer EXAM: TUNNEL POWER PORT PLACEMENT WITH SUBCUTANEOUS POCKET UTILIZING ULTRASOUND & FLOUROSCOPY FLUOROSCOPY TIME:  24 seconds MEDICATIONS AND MEDICAL HISTORY: Versed 1.5 mg, Fentanyl 50 mcg. Additional Medications: As antibiotic prophylaxis, Ancef was ordered pre-procedure and administered intravenously within one hour of incision. ANESTHESIA/SEDATION: Moderate sedation time: 25 minutes CONTRAST:  None PROCEDURE: After written informed consent was obtained, patient was placed in the supine position on angiographic table. The right neck and chest was prepped and draped in a sterile fashion. Lidocaine was utilized for local anesthesia. The right jugular vein was noted to be patent initially with ultrasound. Under sonographic guidance, a micropuncture needle was inserted into the right IJ vein (Ultrasound and fluoroscopic image documentation was performed). The needle was removed over an 018 wire which was exchanged for a Amplatz. This was advanced into the IVC. An 8-French dilator was  advanced over the Amplatz. A small incision was made in the right upper chest over the anterior right second rib. Utilizing blunt dissection, a subcutaneous pocket was created in the caudal direction. The pocket was irrigated with a copious amount of sterile normal saline. The port catheter was tunneled from the chest incision, and out the neck incision. The reservoir was inserted into the subcutaneous pocket and secured with two 3-0 Ethilon stitches. A peel-away sheath was advanced over the Amplatz wire. The port catheter was cut to measure length and inserted through the peel-away sheath. The peel-away sheath was removed. The chest incision was closed with 3-0 Vicryl interrupted stitches for the subcutaneous tissue and a running of 4-0 Vicryl subcuticular stitch for the skin. The neck incision was closed with a 4-0 Vicryl subcuticular stitch. Derma-bond was applied to both surgical incisions. The port reservoir was flushed and instilled with heparinized saline. No complications. FINDINGS: A right IJ vein Port-A-Cath is in place with its tip at the cavoatrial junction. COMPLICATIONS: None IMPRESSION: Successful 8 French right internal jugular vein power port placement with its tip at the SVC/RA junction. Electronically Signed: By: Marybelle Killings M.D. On: 04/18/2015 16:22   Ir US Guide Vasc Access Right  04/26/2015  ADDENDUM REPORT: 04/26/2015 09:54 ADDENDUM: Nursing monitored the patient during sedation. Electronically Signed   By: Marybelle Killings M.D.   On: 04/26/2015 09:54  04/26/2015  CLINICAL DATA:  Pancreatic cancer EXAM: TUNNEL POWER PORT PLACEMENT WITH SUBCUTANEOUS POCKET UTILIZING ULTRASOUND & FLOUROSCOPY FLUOROSCOPY TIME:  24 seconds MEDICATIONS AND MEDICAL HISTORY: Versed 1.5 mg, Fentanyl 50 mcg. Additional Medications: As antibiotic prophylaxis, Ancef was ordered pre-procedure and administered intravenously within one hour of incision. ANESTHESIA/SEDATION: Moderate sedation time: 25 minutes CONTRAST:  None  PROCEDURE: After written informed consent was obtained, patient was placed in the supine position on angiographic table. The right neck and chest was prepped and draped in a sterile fashion. Lidocaine was utilized for local anesthesia. The right jugular vein was noted to be patent initially with ultrasound. Under sonographic guidance, a micropuncture needle was inserted into the right IJ vein (Ultrasound and fluoroscopic image documentation was performed). The needle was removed over an 018 wire which was exchanged for a Amplatz. This was advanced into the IVC. An 8-French dilator was advanced over the Amplatz. A small incision was made in the right upper chest over the anterior right second rib. Utilizing blunt dissection, a subcutaneous pocket was created in the caudal  direction. The pocket was irrigated with a copious amount of sterile normal saline. The port catheter was tunneled from the chest incision, and out the neck incision. The reservoir was inserted into the subcutaneous pocket and secured with two 3-0 Ethilon stitches. A peel-away sheath was advanced over the Amplatz wire. The port catheter was cut to measure length and inserted through the peel-away sheath. The peel-away sheath was removed. The chest incision was closed with 3-0 Vicryl interrupted stitches for the subcutaneous tissue and a running of 4-0 Vicryl subcuticular stitch for the skin. The neck incision was closed with a 4-0 Vicryl subcuticular stitch. Derma-bond was applied to both surgical incisions. The port reservoir was flushed and instilled with heparinized saline. No complications. FINDINGS: A right IJ vein Port-A-Cath is in place with its tip at the cavoatrial junction. COMPLICATIONS: None IMPRESSION: Successful 8 French right internal jugular vein power port placement with its tip at the SVC/RA junction. Electronically Signed: By: Marybelle Killings M.D. On: 04/18/2015 16:22   Dg Chest Port 1 View  04/12/2015  CLINICAL DATA:  Oxygen  desaturation EXAM: PORTABLE CHEST 1 VIEW COMPARISON:  03/29/2015 FINDINGS: Moderate to large hiatal hernia.  Mild cardiac enlargement. Mild left lower lobe atelectasis. Band of opacity right mid lung zone appears consistent with subsegmental atelectasis similar to prior study. IMPRESSION: Compare sent to CT scan, allowing for comparison of different modalities, shows no significant change with continued subsegmental atelectasis right mid lung zone. Electronically Signed   By: Skipper Cliche M.D.   On: 04/12/2015 13:47    ASSESSMENT & PLAN:   #1 metastatic pancreatic cancer Status post a cycle of gemcitabine and Abraxane on 04/19/2015. Plan -We will have to delay her second cycle for at least 1 week to her infection is controlled and she feels better. -No neutropenia at this time.  #2 acute and chronic anemia - hemoglobin of 6.7 seems to be a hemodiluted lab . Uncertain if there was a true drop in her hemoglobin . Patient received 2 units of PRBCs hemoglobin is up to 10.3 . -Anemia is predominantly been because of her metastatic malignancy and chemotherapy .  #3 sepsis likely due to UTI . Chest x-ray showed no evidence of pneumonia. No acute changes. -On broad-spectrum antibiotics per hospital medicine team . -Follow-up urine and blood cultures .  4 dehydration and hypokalemia  -IV fluids and potassium replacement    #5 hypoxia with hypotension- could have been from her sepsis if persistent hypoxia might need to consider ruling out pulmonary embolism. -Continue management per hospital medicine team.  We will continue follow-up as needed. Patient will need a follow-up in one week with Dr Irene Limbo after discharge from the hospital.    I spent 25 minutes counseling the patient face to face. The total time spent in the appointment was 30 minutes and more than 50% was on counseling and direct patient cares.    Sullivan Lone MD [place] AAHIVMS [facility] Hematology/Oncology Physician [facility]  (Office):       [phone] (Work cell):  [phone] (Fax):           [phone]  05/02/2015 1:38 PM

## 2015-05-03 ENCOUNTER — Ambulatory Visit: Payer: Medicare HMO

## 2015-05-03 ENCOUNTER — Inpatient Hospital Stay (HOSPITAL_COMMUNITY): Payer: Medicare HMO

## 2015-05-03 ENCOUNTER — Ambulatory Visit: Payer: Medicare HMO | Admitting: Hematology

## 2015-05-03 ENCOUNTER — Other Ambulatory Visit: Payer: Medicare HMO

## 2015-05-03 DIAGNOSIS — I5023 Acute on chronic systolic (congestive) heart failure: Secondary | ICD-10-CM

## 2015-05-03 DIAGNOSIS — R42 Dizziness and giddiness: Secondary | ICD-10-CM | POA: Insufficient documentation

## 2015-05-03 DIAGNOSIS — J209 Acute bronchitis, unspecified: Secondary | ICD-10-CM

## 2015-05-03 DIAGNOSIS — J9601 Acute respiratory failure with hypoxia: Secondary | ICD-10-CM

## 2015-05-03 DIAGNOSIS — E43 Unspecified severe protein-calorie malnutrition: Secondary | ICD-10-CM | POA: Insufficient documentation

## 2015-05-03 DIAGNOSIS — I509 Heart failure, unspecified: Secondary | ICD-10-CM

## 2015-05-03 DIAGNOSIS — R0602 Shortness of breath: Secondary | ICD-10-CM

## 2015-05-03 LAB — CBC
HEMATOCRIT: 33 % — AB (ref 36.0–46.0)
HEMOGLOBIN: 10 g/dL — AB (ref 12.0–15.0)
MCH: 25.8 pg — AB (ref 26.0–34.0)
MCHC: 30.3 g/dL (ref 30.0–36.0)
MCV: 85.3 fL (ref 78.0–100.0)
Platelets: 457 10*3/uL — ABNORMAL HIGH (ref 150–400)
RBC: 3.87 MIL/uL (ref 3.87–5.11)
RDW: 17.3 % — ABNORMAL HIGH (ref 11.5–15.5)
WBC: 12.2 10*3/uL — AB (ref 4.0–10.5)

## 2015-05-03 LAB — INFLUENZA PANEL BY PCR (TYPE A & B)
H1N1FLUPCR: NOT DETECTED
INFLBPCR: NEGATIVE
Influenza A By PCR: NEGATIVE

## 2015-05-03 LAB — BASIC METABOLIC PANEL
ANION GAP: 10 (ref 5–15)
BUN: 6 mg/dL (ref 6–20)
CHLORIDE: 106 mmol/L (ref 101–111)
CO2: 24 mmol/L (ref 22–32)
Calcium: 7.5 mg/dL — ABNORMAL LOW (ref 8.9–10.3)
Creatinine, Ser: 0.55 mg/dL (ref 0.44–1.00)
GFR calc Af Amer: >60 mL/min (ref >60)
GLUCOSE: 96 mg/dL (ref 65–99)
POTASSIUM: 4 mmol/L (ref 3.5–5.1)
Sodium: 140 mmol/L (ref 135–145)

## 2015-05-03 LAB — ECHOCARDIOGRAM COMPLETE
Height: 58 in
WEIGHTICAEL: 2256 [oz_av]

## 2015-05-03 LAB — MAGNESIUM: Magnesium: 1.3 mg/dL — ABNORMAL LOW (ref 1.7–2.4)

## 2015-05-03 MED ORDER — IPRATROPIUM-ALBUTEROL 0.5-2.5 (3) MG/3ML IN SOLN
3.0000 mL | Freq: Four times a day (QID) | RESPIRATORY_TRACT | Status: DC
  Administered 2015-05-03 (×2): 3 mL via RESPIRATORY_TRACT
  Filled 2015-05-03 (×3): qty 3

## 2015-05-03 MED ORDER — DOXYCYCLINE HYCLATE 100 MG PO TABS
100.0000 mg | ORAL_TABLET | Freq: Two times a day (BID) | ORAL | Status: DC
  Administered 2015-05-03 – 2015-05-05 (×5): 100 mg via ORAL
  Filled 2015-05-03 (×5): qty 1

## 2015-05-03 MED ORDER — ALBUTEROL SULFATE (2.5 MG/3ML) 0.083% IN NEBU: 2.5000 mg | INHALATION_SOLUTION | RESPIRATORY_TRACT | Status: DC | PRN

## 2015-05-03 MED ORDER — MAGNESIUM SULFATE 2 GM/50ML IV SOLN
2.0000 g | Freq: Once | INTRAVENOUS | Status: AC
  Administered 2015-05-03: 2 g via INTRAVENOUS
  Filled 2015-05-03: qty 50

## 2015-05-03 MED ORDER — FUROSEMIDE 10 MG/ML IJ SOLN
60.0000 mg | Freq: Once | INTRAMUSCULAR | Status: AC
  Administered 2015-05-03: 60 mg via INTRAVENOUS
  Filled 2015-05-03: qty 6

## 2015-05-03 NOTE — Progress Notes (Signed)
Echocardiogram 2D Echocardiogram has been performed.  Tammy Schmitt 05/03/2015, 1:41 PM

## 2015-05-03 NOTE — Progress Notes (Addendum)
PROGRESS NOTE    Tammy Schmitt  WER:[id number]  DOB: Jul 20, 1940  DOA: 04/30/2015 PCP: Tammy Peng, NP Outpatient Specialists:   Hospital course: 75 y.o. female with metastatic pancreatic cancer, patient is on palliative chemotherapy. Patient presents to the ED with 2 day history of generalized weakness, low grade fever, fatigue. In the ED she is septic with WBC 25k, fever 100.2, UTI found on UA. BP dropped to 70s, pressors started and PCCM was called. She has PMH of CAD, HLD, HTN.  After discussion with patient, patient decided against being on pressors, she wants primarily comfort measures and less invasive medical treatment. Pressors stopped and BP remained 19J systolic. She was admitted for sepsis secondary to presumed UTI. Urine culture negative. Flu panel PCR negative. De escalated antibiotics to oral doxycycline for acute bronchitis. Acute respiratory failure with hypoxia on 3/19-probably secondary to decompensated CHF and acute bronchitis. Responded well to IV Lasix. Possible DC home in the next 1-2 days.  Assessment & Plan:   SIRS/possible acute bronchitis - Patient met sepsis criteria on admission. Source of sepsis initially was felt to be due to UTI. She was briefly started on vasopressors for hypotension but patient declined pressors.  -  she was empirically started on IV vancomycin and Zosyn pending urine culture results. Hydrated with IV fluids.  - Blood cultures 2: Negative to date. Urine culture times one: Negative.  - SIRS physiology resolved. Unclear source of her SIRS-flu panel PCR neg. Repeat chest x-ray without pneumonia.? Acute bronchitis. - Chest x-ray without pneumonia. CT abdomen without acute findings but did show pancreatic malignancy and some ascites. - Discontinue IV vancomycin and Zosyn and start oral doxycycline to treat for acute bronchitis.  Anemia of chronic disease - Hemoglobin 6.7 on 05/01/15 which may have been related to hemodilution rather  than true drop in hemoglobin. She however received 2 units of PRBC and her hemoglobin has improved to 10.3. - Anemia secondary to metastatic malignancy and chemotherapy. Stable.  Dehydration - Resolved. Discontinued IV fluids 3/8.   Hypotension - May have been from dehydration. Resolved.  Hypokalemia - Replaced.   Hypomagnesemia - Replace and follow  Metastatic pancreatic cancer - Oncology follow-up appreciated. They plan to hold chemotherapy for now.  Acute on chronic systolic CHF - Probably precipitated by acute illness and IV fluid resuscitation. Discontinued IV fluids on 3/8. Responded well to a dose of IV Lasix 60 mg 1. Monitor closely. - Reassess in a.m. for further diuresis. - Repeat 2-D echo-none since December 2015.  Acute respiratory failure with hypoxia  - Secondary to acute bronchitis and CHF. Treatment as above. Oxygen supplements.  CAD - sees Dr. Acie Fredrickson. Asymptomatic of chest pain.  Severe malnutrition in context of chronic illness - Management per dietitian consultation.   DVT prophylaxis:Subcutaneous heparin Code Status: DO NOT RESUSCITATE Family Communication: None at bedside  Disposition Plan: DC home when medically stable, possibly in the next 1-2 days.    Consultants:  CCM-signed off  Oncology  Procedures:  None   Antimicrobials:  IV Zosyn 3/6 > 3/8  IV vancomycin 3/6 > 3/9  Oral doxycycline 3/9 >  Subjective: Complained of dyspnea and wheezing this morning. Stated that she has history of CHF for which she sees Dr.Nahser but has not been on diuretics at home. No chest pain.  Objective: Filed Vitals:   05/03/15 0600 05/03/15 0840 05/03/15 0900 05/03/15 1000  BP: 121/80   96/54  Pulse: 111   112  Temp: 99 F (37.2 C)  97.6 F (36.4 C)   TempSrc: Oral  Oral   Resp: 18     Height:      Weight:      SpO2: 90% 94%  97%    Intake/Output Summary (Last 24 hours) at 05/03/15 1332 Last data filed at 05/03/15 1241  Gross per 24  hour  Intake    500 ml  Output   3300 ml  Net  -2800 ml   Filed Weights   04/30/15 1859 04/30/15 2019  Weight: 63.957 kg (141 lb) 63.957 kg (141 lb)    Exam:  General exam: Pleasant elderly female sitting up In bed this morning with mild increased work of breathing.  Respiratory system: Reduced breath sounds bilaterally with scattered bibasal few fine crackles and occasional medium pitched expiratory rhonchi. Mild increased work of breathing. Cardiovascular system: S1 & S2 heard, RRR. No murmurs, gallops, clicks or pedal edema. JVD +. Gastrointestinal system: Abdomen is nondistended, soft and nontender. Normal bowel sounds heard. Central nervous system: Alert and oriented. No focal neurological deficits. Extremities: Symmetric 5 x 5 power.   Data Reviewed: Basic Metabolic Panel:  Recent Labs Lab 04/30/15 1921 05/01/15 0419 05/02/15 1145 05/03/15 0600  NA 138 135 141 140  K 3.1* 3.2* 2.9* 4.0  CL 98* 102 105 106  CO2 '27 26 26 24  ' GLUCOSE 107* 113* 93 96  BUN '13 12 8 6  ' CREATININE 0.81 0.68 0.55 0.55  CALCIUM 8.1* 6.9* 7.2* 7.5*  MG  --   --   --  1.3*   Liver Function Tests:  Recent Labs Lab 04/30/15 1921  AST 26  ALT 11*  ALKPHOS 82  BILITOT 0.9  PROT 6.2*  ALBUMIN 2.5*   No results for input(s): LIPASE, AMYLASE in the last 168 hours. No results for input(s): AMMONIA in the last 168 hours. CBC:  Recent Labs Lab 04/30/15 1920 05/01/15 0419 05/02/15 1145 05/03/15 0600  WBC 25.3* 18.1* 13.2* 12.2*  NEUTROABS 21.3*  --   --   --   HGB 8.6* 6.7* 10.3* 10.0*  HCT 28.3* 22.1* 33.5* 33.0*  MCV 84.7 85.0 85.0 85.3  PLT 303 237 381 457*   Cardiac Enzymes: No results for input(s): CKTOTAL, CKMB, CKMBINDEX, TROPONINI in the last 168 hours. BNP (last 3 results) No results for input(s): PROBNP in the last 8760 hours. CBG: No results for input(s): GLUCAP in the last 168 hours.  Recent Results (from the past 240 hour(s))  Culture, blood (routine x 2)      Status: None (Preliminary result)   Collection Time: 04/30/15  7:20 PM  Result Value Ref Range Status   Specimen Description BLOOD RIGHT HAND  Final   Special Requests   Final    BOTTLES DRAWN AEROBIC AND ANAEROBIC 5CC BOTH BOTTLES   Culture   Final    NO GROWTH 2 DAYS Performed at [facility]    Report Status PENDING  Incomplete  Urine culture     Status: None   Collection Time: 04/30/15  8:19 PM  Result Value Ref Range Status   Specimen Description URINE, CATHETERIZED  Final   Special Requests NONE  Final   Culture   Final    NO GROWTH 1 DAY Performed at [facility]    Report Status 05/02/2015 FINAL  Final  Culture, blood (routine x 2)     Status: None (Preliminary result)   Collection Time: 04/30/15  8:35 PM  Result Value Ref Range Status   Specimen  Description BLOOD PORTA CATH  Final   Special Requests BOTTLES DRAWN AEROBIC AND ANAEROBIC 5 ML  Final   Culture   Final    NO GROWTH 2 DAYS Performed at [facility]    Report Status PENDING  Incomplete         Studies: Dg Chest Port 1 View  05/03/2015  CLINICAL DATA:  Cough for 1 day, shortness of Breath EXAM: PORTABLE CHEST 1 VIEW COMPARISON:  04/30/2015 FINDINGS: Cardiomegaly again noted. Stable linear atelectasis or fluid in right minor fissure in right perihilar region. Right IJ Port-A-Cath is unchanged in position. Status post median sternotomy. Again noted surgical clips in left axilla. Mild left basilar atelectasis. Subtle mild perihilar bronchitic changes. No segmental infiltrate or pulmonary edema. IMPRESSION: No infiltrate or pulmonary edema. Subtle mild perihilar bronchitic changes. Status post median sternotomy. Stable right IJ port Port-A-Cath position. Again noted linear atelectasis or small amount of fluid in right minor fissure perihilar region. Mild left basilar atelectasis. Electronically Signed   By: Lahoma Crocker M.D.   On: 05/03/2015 08:51        Scheduled Meds: . aspirin EC  81  mg Oral Daily  . doxycycline  100 mg Oral Q12H  . feeding supplement (ENSURE ENLIVE)  237 mL Oral TID WC  . fentaNYL  25 mcg Transdermal Q72H  . heparin  5,000 Units Subcutaneous 3 times per day  . ipratropium-albuterol  3 mL Nebulization Q6H  . lipase/protease/amylase  24,000 Units Oral TID WC  . mometasone-formoterol  2 puff Inhalation BID  . pantoprazole  80 mg Oral BID  . senna-docusate  2 tablet Oral BID  . sodium chloride flush  10-40 mL Intracatheter Q12H   Continuous Infusions:    Principal Problem:   Septic shock due to urinary tract infection (HCC) Active Problems:   Pancreatic adenocarcinoma (HCC)   Peritoneal metastases (HCC)   Malignant neoplasm of pancreas (HCC)   Acute blood loss anemia   Anemia of chronic disease   Leukocytosis   Hypokalemia   SIRS (systemic inflammatory response syndrome) (HCC)   Protein-calorie malnutrition, severe    Time spent: 30 minutes.    Vernell Leep, MD, FACP, FHM. [facility] Pager [phone]  If 7PM-7AM, please contact night-coverage [email or web address] Password [email or web address] 05/03/2015, 1:32 PM    LOS: 2 days

## 2015-05-03 NOTE — Progress Notes (Signed)
Tammy Schmitt   HEMATOLOGY/ONCOLOGY INPATIENT PROGRESS NOTE  Date of Service: 05/03/2015  Inpatient Attending: .Modena Jansky, MD   SUBJECTIVE  [name] was in this afternoon. Notes that she was quite short of breath and fluid overload at this morning even had an echocardiogram that shows an ejection fraction of 30-35%. Previously about 40% on her myocardial perfusion scan in 2013. Received Lasix with good diuresis and resolution of her shortness of breath. Potassium normal needed magnesium low and being replaced. Patient notes persistent sense of imbalance whenever she stands up. Appears to have some nystagmus and dysmetria. CT scan of the head of ordered to rule out metastases or CVA. Patient notes that she feels well other than the sense of imbalance which is limiting her from getting up and out of bed.   OBJECTIVE:  NAD  PHYSICAL EXAMINATION: . Filed Vitals:   05/03/15 0840 05/03/15 0900 05/03/15 1000 05/03/15 1400  BP:   96/54 92/57  Pulse:   112 110  Temp:  97.6 F (36.4 C)  97.9 F (36.6 C)  TempSrc:  Oral  Oral  Resp:      Height:      Weight:      SpO2: 94%  97% 97%   Filed Weights   04/30/15 1859 04/30/15 2019  Weight: 141 lb (63.957 kg) 141 lb (63.957 kg)   .Body mass index is 29.48 kg/(m^2).  GENERAL:alert, in no acute distress and comfortable SKIN: skin color, texture, turgor are normal, no rashes or significant lesions EYES: normal, conjunctiva are pink and non-injected, sclera clear OROPHARYNX:no exudate, no erythema and lips, buccal mucosa, and tongue normal  NECK: supple, no JVD, thyroid normal size, non-tender, without nodularity LYMPH:  no palpable lymphadenopathy in the cervical, axillary or inguinal LUNGS:Few basal rales HEART: regular rate & rhythm,  no murmurs and no lower extremity edema ABDOMEN: abdomen soft, non-tender, normoactive bowel sounds  Musculoskeletal: no cyanosis of digits and no clubbing  PSYCH: alert & oriented x 3 with fluent  speech NEURO: no focal motor/sensory deficits  MEDICAL HISTORY:  History reviewed. No pertinent past medical history.  SURGICAL HISTORY: Past Surgical History  Procedure Laterality Date  . Breast surgery Left     lymph nodes removed also  . Eye surgery Bilateral     cataracts removed  . Ovary surgery      one  tube removed , one ovary trimmed down  . Heart bypass      x 2, stent  . Coronary artery bypass graft  2001    x 2  . Cardiac catheterization  2001  . Eus N/A 03/08/2015    Procedure: UPPER ENDOSCOPIC ULTRASOUND (EUS) LINEAR;  Surgeon: Milus Banister, MD;  Location: [facility];  Service: Endoscopy;  Laterality: N/A;  radial linear  . Colonoscopy    . Laparoscopy N/A 04/11/2015    Procedure: LAPAROSCOPY DIAGNOSTIC;  Surgeon: Stark Klein, MD;  Location: [facility];  Service: General;  Laterality: N/A;    SOCIAL HISTORY: Social History   Social History  . Marital Status: Divorced    Spouse Name: N/A  . Number of Children: 3  . Years of Education: N/A   Occupational History  . retired    Social History Main Topics  . Smoking status: Former Smoker -- 45 years    Types: Cigarettes    Quit date: 12/26/1999  . Smokeless tobacco: Never Used  . Alcohol Use: No  . Drug Use: No  . Sexual Activity: Not on file  Other Topics Concern  . Not on file   Social History Narrative   Retired from working with disabled individuals    Two sons and one daughter (daughter and one son have [place]   Divorced, lives alone; moved to [place] from [place] May 2016   She likes to Psychologist, occupational and exercise.     FAMILY HISTORY: Family History  Problem Relation Age of Onset  . Breast cancer Mother 69  . Heart disease Father   . Heart disease Brother   . Esophageal cancer Daughter 5    died at 16  . Leukemia Son 36  . Colon cancer Neg Hx   . Stomach cancer Neg Hx     ALLERGIES:  is allergic to statins; ace inhibitors; codeine; and levaquin.  MEDICATIONS:  Scheduled Meds: . aspirin EC   81 mg Oral Daily  . doxycycline  100 mg Oral Q12H  . feeding supplement (ENSURE ENLIVE)  237 mL Oral TID WC  . fentaNYL  25 mcg Transdermal Q72H  . heparin  5,000 Units Subcutaneous 3 times per day  . ipratropium-albuterol  3 mL Nebulization Q6H  . lipase/protease/amylase  24,000 Units Oral TID WC  . mometasone-formoterol  2 puff Inhalation BID  . pantoprazole  80 mg Oral BID  . senna-docusate  2 tablet Oral BID  . sodium chloride flush  10-40 mL Intracatheter Q12H   Continuous Infusions:   PRN Meds:.acetaminophen, albuterol, artificial tears, HYDROmorphone, LORazepam, ondansetron, polyvinyl alcohol, sodium chloride flush  REVIEW OF SYSTEMS:    10 Point review of Systems was done is negative except as noted above.   LABORATORY DATA:  I have reviewed the data as listed  . CBC Latest Ref Rng 05/03/2015 05/02/2015 05/01/2015  WBC 4.0 - 10.5 K/uL 12.2(H) 13.2(H) 18.1(H)  Hemoglobin 12.0 - 15.0 g/dL 10.0(L) 10.3(L) 6.7(LL)  Hematocrit 36.0 - 46.0 % 33.0(L) 33.5(L) 22.1(L)  Platelets 150 - 400 K/uL 457(H) 381 237    . CMP Latest Ref Rng 05/03/2015 05/02/2015 05/01/2015  Glucose 65 - 99 mg/dL 96 93 113(H)  BUN 6 - 20 mg/dL 6 8 12   Creatinine 0.44 - 1.00 mg/dL 0.55 0.55 0.68  Sodium 135 - 145 mmol/L 140 141 135  Potassium 3.5 - 5.1 mmol/L 4.0 2.9(L) 3.2(L)  Chloride 101 - 111 mmol/L 106 105 102  CO2 22 - 32 mmol/L 24 26 26   Calcium 8.9 - 10.3 mg/dL 7.5(L) 7.2(L) 6.9(L)  Total Protein 6.5 - 8.1 g/dL - - -  Total Bilirubin 0.3 - 1.2 mg/dL - - -  Alkaline Phos 38 - 126 U/L - - -  AST 15 - 41 U/L - - -  ALT 14 - 54 U/L - - -     RADIOGRAPHIC STUDIES: I have personally reviewed the radiological images as listed and agreed with the findings in the report. Dg Chest 2 View  04/30/2015  CLINICAL DATA:  New onset fever. Nausea this morning. Patient on chemotherapy (most recent 5 days prior) for pancreatic cancer. EXAM: CHEST  2 VIEW COMPARISON:  Chest radiographs 04/11/2014, PET-CT 03/29/2015  FINDINGS: Tip of the right chest port in the SVC. Patient is post median sternotomy. Cardiomediastinal contours are unchanged. There is a moderate retrocardiac hiatal hernia. Linear density in the right midlung may be atelectasis, pleural thickening or minimal fluid in the minor fissure. No confluent airspace disease. Minimal blunting of both costophrenic angles, may reflect small pleural effusions. No pneumothorax. Small pulmonary nodules on PET-CT not well seen radiographically. There are surgical clips  in the left axilla. IMPRESSION: 1. No evidence of pneumonia. 2. Linear right midlung opacity may be atelectasis, pleural thickening or fluid in the right minor fissure. 3. Question small pleural effusions. Electronically Signed   By: Jeb Levering M.D.   On: 04/30/2015 20:02   Ct Abdomen Pelvis W Contrast  04/30/2015  CLINICAL DATA:  New onset fever and nausea. Abdominal tenderness. Recent diagnosis of pancreatic cancer. Most recent chemotherapy five days prior 04/26/2015. EXAM: CT ABDOMEN AND PELVIS WITH CONTRAST TECHNIQUE: Multidetector CT imaging of the abdomen and pelvis was performed using the standard protocol following bolus administration of intravenous contrast. CONTRAST:  155mL OMNIPAQUE IOHEXOL 300 MG/ML  SOLN COMPARISON:  PET-CT 10/16/2015.  Abdominal CT 02/09/2015 FINDINGS: Lower chest: Tiny bilateral pleural effusions with adjacent atelectasis at the lung bases. Hiatal hernia unchanged in size. Coronary artery calcifications and atherosclerosis of the distal thoracic aorta. Liver: Scattered subcentimeter hypodensities throughout the liver, nonspecific, not significantly changed from prior exam. Hepatobiliary: Gallbladder physiologically distended, no calcified stone. There is a Phrygian cap. No biliary dilatation. Pancreas: Ill-defined hypodense lesion adjacent to the uncinate process of the pancreas and duodenum has diminished in size currently measuring 2.6 x 1.9 cm, previously 4.0 x 3.5 cm on  prior CT. No pancreatic ductal dilatation. Spleen: No focal lesion. Adrenal glands: No nodule. Kidneys: Symmetric renal enhancement. No hydronephrosis. No perinephric stranding. Stomach/Bowel: Stomach is decompressed, hiatal hernia again seen. There are no dilated or thickened small bowel loops. Small volume of stool throughout the colon without colonic wall thickening. The appendix is not visualized. Vascular/Lymphatic: No retroperitoneal adenopathy. Abdominal aorta is normal in caliber. Dense atherosclerosis without aneurysm. Retroperitoneal mass encompasses the superior mesenteric artery as described previously. Omental nodularity and soft tissue density is, with minimal increase in the interim. For example, the anterior omental nodule measures 1.6 cm, previously 1.3 cm. The small bowel mesenteric nodules are difficult to differentiate from mesenteric ascites. Reproductive: Left ovarian mass again seen, 4.3 x 4.0 cm, not significantly changed allowing for differences in caliper placement. Uterus remains in situ. Right ovary not seen. Bladder: Completely decompressed. Other: Moderate volume of intra-abdominal and pelvic ascites, grossly unchanged to minimally increased from prior PET. Diffuse omental nodularity and soft tissue stranding. No free air. Musculoskeletal: There are no acute or suspicious osseous abnormalities. Degenerative change in the spine. IMPRESSION: 1. Decreased size of the soft tissue mass in the retroperitoneum, likely arising from the uncinate process of the pancreas versus less likely transverse duodenum. There is however increased size of omental nodules from prior PET. Diffuse omental and peritoneal nodularity again seen. Volume of intra-abdominal and pelvic ascites is unchanged to minimally increased. 2. Small pleural effusions with adjacent atelectasis at the lung bases. 3. Left ovarian lesion, unchanged in the interim. 4. Hiatal hernia again seen. Additional chronic findings as  described. Electronically Signed   By: Jeb Levering M.D.   On: 04/30/2015 22:11   Ir Fluoro Guide Cv Line Right  04/26/2015  ADDENDUM REPORT: 04/26/2015 09:54 ADDENDUM: Nursing monitored the patient during sedation. Electronically Signed   By: Marybelle Killings M.D.   On: 04/26/2015 09:54  04/26/2015  CLINICAL DATA:  Pancreatic cancer EXAM: TUNNEL POWER PORT PLACEMENT WITH SUBCUTANEOUS POCKET UTILIZING ULTRASOUND & FLOUROSCOPY FLUOROSCOPY TIME:  24 seconds MEDICATIONS AND MEDICAL HISTORY: Versed 1.5 mg, Fentanyl 50 mcg. Additional Medications: As antibiotic prophylaxis, Ancef was ordered pre-procedure and administered intravenously within one hour of incision. ANESTHESIA/SEDATION: Moderate sedation time: 25 minutes CONTRAST:  None PROCEDURE: After written informed consent  was obtained, patient was placed in the supine position on angiographic table. The right neck and chest was prepped and draped in a sterile fashion. Lidocaine was utilized for local anesthesia. The right jugular vein was noted to be patent initially with ultrasound. Under sonographic guidance, a micropuncture needle was inserted into the right IJ vein (Ultrasound and fluoroscopic image documentation was performed). The needle was removed over an 018 wire which was exchanged for a Amplatz. This was advanced into the IVC. An 8-French dilator was advanced over the Amplatz. A small incision was made in the right upper chest over the anterior right second rib. Utilizing blunt dissection, a subcutaneous pocket was created in the caudal direction. The pocket was irrigated with a copious amount of sterile normal saline. The port catheter was tunneled from the chest incision, and out the neck incision. The reservoir was inserted into the subcutaneous pocket and secured with two 3-0 Ethilon stitches. A peel-away sheath was advanced over the Amplatz wire. The port catheter was cut to measure length and inserted through the peel-away sheath. The peel-away  sheath was removed. The chest incision was closed with 3-0 Vicryl interrupted stitches for the subcutaneous tissue and a running of 4-0 Vicryl subcuticular stitch for the skin. The neck incision was closed with a 4-0 Vicryl subcuticular stitch. Derma-bond was applied to both surgical incisions. The port reservoir was flushed and instilled with heparinized saline. No complications. FINDINGS: A right IJ vein Port-A-Cath is in place with its tip at the cavoatrial junction. COMPLICATIONS: None IMPRESSION: Successful 8 French right internal jugular vein power port placement with its tip at the SVC/RA junction. Electronically Signed: By: Marybelle Killings M.D. On: 04/18/2015 16:22   Ir US Guide Vasc Access Right  04/26/2015  ADDENDUM REPORT: 04/26/2015 09:54 ADDENDUM: Nursing monitored the patient during sedation. Electronically Signed   By: Marybelle Killings M.D.   On: 04/26/2015 09:54  04/26/2015  CLINICAL DATA:  Pancreatic cancer EXAM: TUNNEL POWER PORT PLACEMENT WITH SUBCUTANEOUS POCKET UTILIZING ULTRASOUND & FLOUROSCOPY FLUOROSCOPY TIME:  24 seconds MEDICATIONS AND MEDICAL HISTORY: Versed 1.5 mg, Fentanyl 50 mcg. Additional Medications: As antibiotic prophylaxis, Ancef was ordered pre-procedure and administered intravenously within one hour of incision. ANESTHESIA/SEDATION: Moderate sedation time: 25 minutes CONTRAST:  None PROCEDURE: After written informed consent was obtained, patient was placed in the supine position on angiographic table. The right neck and chest was prepped and draped in a sterile fashion. Lidocaine was utilized for local anesthesia. The right jugular vein was noted to be patent initially with ultrasound. Under sonographic guidance, a micropuncture needle was inserted into the right IJ vein (Ultrasound and fluoroscopic image documentation was performed). The needle was removed over an 018 wire which was exchanged for a Amplatz. This was advanced into the IVC. An 8-French dilator was advanced over the  Amplatz. A small incision was made in the right upper chest over the anterior right second rib. Utilizing blunt dissection, a subcutaneous pocket was created in the caudal direction. The pocket was irrigated with a copious amount of sterile normal saline. The port catheter was tunneled from the chest incision, and out the neck incision. The reservoir was inserted into the subcutaneous pocket and secured with two 3-0 Ethilon stitches. A peel-away sheath was advanced over the Amplatz wire. The port catheter was cut to measure length and inserted through the peel-away sheath. The peel-away sheath was removed. The chest incision was closed with 3-0 Vicryl interrupted stitches for the subcutaneous tissue and a running of 4-0  Vicryl subcuticular stitch for the skin. The neck incision was closed with a 4-0 Vicryl subcuticular stitch. Derma-bond was applied to both surgical incisions. The port reservoir was flushed and instilled with heparinized saline. No complications. FINDINGS: A right IJ vein Port-A-Cath is in place with its tip at the cavoatrial junction. COMPLICATIONS: None IMPRESSION: Successful 8 French right internal jugular vein power port placement with its tip at the SVC/RA junction. Electronically Signed: By: Marybelle Killings M.D. On: 04/18/2015 16:22   Dg Chest Port 1 View  05/03/2015  CLINICAL DATA:  Cough for 1 day, shortness of Breath EXAM: PORTABLE CHEST 1 VIEW COMPARISON:  04/30/2015 FINDINGS: Cardiomegaly again noted. Stable linear atelectasis or fluid in right minor fissure in right perihilar region. Right IJ Port-A-Cath is unchanged in position. Status post median sternotomy. Again noted surgical clips in left axilla. Mild left basilar atelectasis. Subtle mild perihilar bronchitic changes. No segmental infiltrate or pulmonary edema. IMPRESSION: No infiltrate or pulmonary edema. Subtle mild perihilar bronchitic changes. Status post median sternotomy. Stable right IJ port Port-A-Cath position. Again noted  linear atelectasis or small amount of fluid in right minor fissure perihilar region. Mild left basilar atelectasis. Electronically Signed   By: Lahoma Crocker M.D.   On: 05/03/2015 08:51   Dg Chest Port 1 View  04/12/2015  CLINICAL DATA:  Oxygen desaturation EXAM: PORTABLE CHEST 1 VIEW COMPARISON:  03/29/2015 FINDINGS: Moderate to large hiatal hernia.  Mild cardiac enlargement. Mild left lower lobe atelectasis. Band of opacity right mid lung zone appears consistent with subsegmental atelectasis similar to prior study. IMPRESSION: Compare sent to CT scan, allowing for comparison of different modalities, shows no significant change with continued subsegmental atelectasis right mid lung zone. Electronically Signed   By: Skipper Cliche M.D.   On: 04/12/2015 13:47    ASSESSMENT & PLAN:   #1 metastatic pancreatic cancer Status post a cycle of gemcitabine and Abraxane on 04/19/2015. Plan -We will have to delay her second cycle for at least 1 week to her infection is controlled and she feels better. -No neutropenia at this time.  #2 acute and chronic anemia - hemoglobin of 6.7 seems to be a hemodiluted lab . Uncertain if there was a true drop in her hemoglobin . Patient received 2 units of PRBCs hemoglobin is up to 10 -Anemia is predominantly been because of her metastatic malignancy and chemotherapy . -Hemoglobin remained stable today  #3 sepsis likely due to UTI? Versus bronchitis . Chest x-ray showed no evidence of pneumonia. Urine culture did not show any evidence of overt infection -On antibiotics per hospital medicine team .  4  hypokalemia -resolved with replacement, hypomagnesemia is being replaced today.   #5 CHL decompensation. Echo today shows ejection fraction of 30-35%.Tammy Schmitt History of coronary disease in the past. Has no chest pain right now. Had some symptoms of fluid overload this morning but is resolved with IV Lasix and diuresis. Plan -Continue management as per hospital medicine  team -Anemia has been corrected which should help with her CHF.  #6 dizziness which has been persistent - described as a sense of imbalance when she stands up. This is limiting her from getting out of bed. Not based on head position. Has some nystagmus and mild dysmetria. Plan -We'll get a CT of the head to rule out CVA or metastases.  We will continue follow-up as needed. Patient will need a follow-up in one week with Dr Irene Limbo after discharge from the hospital.   Appreciate the excellent care provided  by the hospitalist team and Dr Algis Liming.  I spent 25 minutes counseling the patient face to face. The total time spent in the appointment was 25 minutes and more than 50% was on counseling and direct patient cares.    Sullivan Lone MD [place] AAHIVMS [facility] Hematology/Oncology Physician [facility]  (Office):       [phone] (Work cell):  [phone] (Fax):           [phone]  05/03/2015 7:10 PM

## 2015-05-03 NOTE — Progress Notes (Signed)
Pharmacy Antibiotic Note  21 yoF with hx breast cancer, CAD with CABGx2 '01, and recent diagnosis of stage IV pancreatic cancer; first cycle of chemotherapy begun 2/23, admitted 3/6 with fever, tachycardia, and nausea.  Pharmacy has been consulted for Vancomycin and Zosyn dosing for sepsis.     Regimen:  Gemzar/Abraxane D1,8,15 q28d Indication: Pancreatic CA Status:  C1D15 due on 3/9; hold for acute illness   3/9:  D4 abx; Tmax 99.1, WBC decr 12.2, SCr wnl  Plan: Vanc 1g IV x 1 in ED, then 500mg  IV q12h Zosyn 3.375g IV q8h (infuse over 4 hours) F/u renal fxn, cultures, clinical course Vancomycin trough 3/10 if Vanc continued  Height: 4\' 10"  (147.3 cm) Weight: 141 lb (63.957 kg) IBW/kg (Calculated) : 40.9  Temp (24hrs), Avg:98.3 F (36.8 C), Min:97.6 F (36.4 C), Max:99 F (37.2 C)   Recent Labs Lab 04/30/15 1920 04/30/15 1921 04/30/15 1929 04/30/15 2049 04/30/15 2301 05/01/15 0419 05/02/15 1145 05/03/15 0600  WBC 25.3*  --   --   --   --  18.1* 13.2* 12.2*  CREATININE  --  0.81  --   --   --  0.68 0.55 0.55  LATICACIDVEN  --   --  1.47 1.25 0.70  --   --   --     Estimated Creatinine Clearance: 48.8 mL/min (by C-G formula based on Cr of 0.55).    Allergies  Allergen Reactions  . Statins Other (See Comments)    Muscle pain    . Ace Inhibitors     From Previous Records  . Codeine Other (See Comments)    Doesn't feel well   . Levaquin [Levofloxacin In D5w]     From previous records   Antimicrobials this admission: 3/6 Vancomycin >>  3/6 Zosyn >>  3/8 Influenza panel: neg  Dose adjustments this admission:  Microbiology results: 3/6 BCx: ngtd 3/6 UCx: ng-final  3/8 Influenza panel: neg  Thank you for allowing pharmacy to be a part of this patient's care.  Minda Ditto PharmD Pager [phone] 05/03/2015, 10:25 AM

## 2015-05-04 DIAGNOSIS — I509 Heart failure, unspecified: Secondary | ICD-10-CM

## 2015-05-04 DIAGNOSIS — I429 Cardiomyopathy, unspecified: Secondary | ICD-10-CM

## 2015-05-04 DIAGNOSIS — I5021 Acute systolic (congestive) heart failure: Secondary | ICD-10-CM

## 2015-05-04 DIAGNOSIS — Z951 Presence of aortocoronary bypass graft: Secondary | ICD-10-CM

## 2015-05-04 LAB — BASIC METABOLIC PANEL
Anion gap: 7 (ref 5–15)
BUN: 7 mg/dL (ref 6–20)
CHLORIDE: 101 mmol/L (ref 101–111)
CO2: 29 mmol/L (ref 22–32)
CREATININE: 0.61 mg/dL (ref 0.44–1.00)
Calcium: 7.1 mg/dL — ABNORMAL LOW (ref 8.9–10.3)
GFR calc Af Amer: >60 mL/min (ref >60)
GFR calc non Af Amer: >60 mL/min (ref >60)
GLUCOSE: 92 mg/dL (ref 65–99)
Potassium: 4.2 mmol/L (ref 3.5–5.1)
SODIUM: 137 mmol/L (ref 135–145)

## 2015-05-04 LAB — BRAIN NATRIURETIC PEPTIDE: B Natriuretic Peptide: 861 pg/mL — ABNORMAL HIGH (ref 0.0–100.0)

## 2015-05-04 LAB — MAGNESIUM: Magnesium: 1.5 mg/dL — ABNORMAL LOW (ref 1.7–2.4)

## 2015-05-04 MED ORDER — FUROSEMIDE 20 MG PO TABS
10.0000 mg | ORAL_TABLET | Freq: Every day | ORAL | Status: DC
Start: 2015-05-04 — End: 2015-05-05
  Administered 2015-05-04 – 2015-05-05 (×2): 10 mg via ORAL
  Filled 2015-05-04 (×2): qty 1

## 2015-05-04 MED ORDER — IPRATROPIUM-ALBUTEROL 0.5-2.5 (3) MG/3ML IN SOLN
3.0000 mL | Freq: Two times a day (BID) | RESPIRATORY_TRACT | Status: DC
  Administered 2015-05-04 – 2015-05-05 (×3): 3 mL via RESPIRATORY_TRACT
  Filled 2015-05-04 (×2): qty 3

## 2015-05-04 MED ORDER — ALBUTEROL SULFATE (2.5 MG/3ML) 0.083% IN NEBU: 2.5000 mg | INHALATION_SOLUTION | RESPIRATORY_TRACT | Status: DC | PRN

## 2015-05-04 MED ORDER — MAGNESIUM SULFATE 2 GM/50ML IV SOLN
2.0000 g | Freq: Once | INTRAVENOUS | Status: AC
  Administered 2015-05-04: 2 g via INTRAVENOUS
  Filled 2015-05-04: qty 50

## 2015-05-04 NOTE — Evaluation (Signed)
Physical Therapy Evaluation Patient Details Name: Tammy Schmitt MRN: [id number] DOB: 1940/09/09 Today's Date: 05/04/2015   History of Present Illness  Pt admitted through ED with fever and nausea.  Pt currently on chemo 2* metastatic pancreatic CA   Clinical Impression  Pt admitted as above and presenting with functional mobility limitations 2* generalized weakness and ambulatory instability.  Pt would benefit from follow up HHPT to regain strength and independence.    Oxygen sats monitored during session. Pt 90% at rest on RA.  While ambulating pt maintained sats at 86 - 90% on RA with HR elevated as high as 117.  After return to sitting x 1 min, pt at 93% with HR at 111.  Pt denies SOB through out session - states "I just feel weak and shaky from being in bed for a week.    Follow Up Recommendations Home health PT    Equipment Recommendations  None recommended by PT    Recommendations for Other Services       Precautions / Restrictions Precautions Precautions: Fall Precaution Comments: Monitor sats Restrictions Weight Bearing Restrictions: No      Mobility  Bed Mobility Overal bed mobility: Modified Independent             General bed mobility comments: Pt to/from EOB unassisted  Transfers Overall transfer level: Needs assistance Equipment used: Rolling walker (2 wheeled) Transfers: Sit to/from Stand Sit to Stand: Min guard         General transfer comment: min guard assist to steady with initial standing  Ambulation/Gait Ambulation/Gait assistance: Min assist;Min guard Ambulation Distance (Feet): 250 Feet Assistive device: Rolling walker (2 wheeled) Gait Pattern/deviations: Step-through pattern;Shuffle;Trunk flexed Gait velocity: dec Gait velocity interpretation: Below normal speed for age/gender General Gait Details: cues for posture and position from RW.  Several short standing rests required to complete task  Stairs            Wheelchair  Mobility    Modified Rankin (Stroke Patients Only)       Balance Overall balance assessment: Needs assistance Sitting-balance support: No upper extremity supported;Feet supported Sitting balance-Leahy Scale: Good     Standing balance support: No upper extremity supported Standing balance-Leahy Scale: Fair                               Pertinent Vitals/Pain Pain Assessment: No/denies pain    Home Living Family/patient expects to be discharged to:: Private residence Living Arrangements: Children Available Help at Discharge: Family Type of Home: House Home Access: Stairs to enter Entrance Stairs-Rails: None Entrance Stairs-Number of Steps: 1 Home Layout: Two level;Able to live on main level with bedroom/bathroom Home Equipment: Gilford Rile - 2 wheels;Cane - single point;Hospital bed Additional Comments: Pt lives in [place] with son and has hospital bed on main floor    Prior Function Level of Independence: Needs assistance   Gait / Transfers Assistance Needed: Independent  ADL's / Homemaking Assistance Needed: Primarily performed by son        Hand Dominance        Extremity/Trunk Assessment   Upper Extremity Assessment: Generalized weakness           Lower Extremity Assessment: Generalized weakness      Cervical / Trunk Assessment: Kyphotic  Communication   Communication: No difficulties  Cognition Arousal/Alertness: Awake/alert Behavior During Therapy: WFL for tasks assessed/performed Overall Cognitive Status: Within Functional Limits for tasks assessed  General Comments      Exercises        Assessment/Plan    PT Assessment Patient needs continued PT services  PT Diagnosis Difficulty walking   PT Problem List Decreased strength;Decreased activity tolerance;Decreased balance;Decreased mobility;Decreased knowledge of use of DME  PT Treatment Interventions DME instruction;Gait training;Stair  training;Functional mobility training;Therapeutic activities;Therapeutic exercise;Patient/family education   PT Goals (Current goals can be found in the Care Plan section) Acute Rehab PT Goals Patient Stated Goal: Regain IND to return home with son PT Goal Formulation: With patient Time For Goal Achievement: 05/18/15 Potential to Achieve Goals: Good    Frequency Min 3X/week   Barriers to discharge        Co-evaluation               End of Session Equipment Utilized During Treatment: Gait belt;Other (comment) (Sat monitor) Activity Tolerance: Patient tolerated treatment well Patient left: in bed;with call bell/phone within reach;with bed alarm set Nurse Communication: Mobility status         Time: [id number] PT Time Calculation (min) (ACUTE ONLY): 27 min   Charges:   PT Evaluation $PT Eval Low Complexity: 1 Procedure PT Treatments $Gait Training: 8-22 mins   PT G Codes:        Tammy Schmitt 05-18-15, 12:32 PM

## 2015-05-04 NOTE — Care Management Note (Signed)
Case Management Note  Patient Details  Name: Tammy Schmitt MRN: [id number] Date of Birth: 12/02/1940  Subjective/Objective:           74 yo admitted with Septic shock due to UTI. Hx of Pancreatic CA         Action/Plan: From home with son and was in the home palliative care program with [facility].  Expected Discharge Date:   (unknown)               Expected Discharge Plan:  Home w Home Health Services  In-House Referral:     Discharge planning Services  CM Consult  Post Acute Care Choice:  Hospice Choice offered to:  Patient  DME Arranged:    DME Agency:     HH Arranged:  Disease Management HH Agency:  [facility]  Status of Service:  In process, will continue to follow  Medicare Important Message Given:  Yes Date Medicare IM Given:    Medicare IM give by:    Date Additional Medicare IM Given:    Additional Medicare Important Message give by:     If discussed at Long Length of Stay Meetings, dates discussed:    Additional Comments: This CM met with pt at bedside to discuss dc planning.  Pt states she wants full hospice services and would not like to stay with [facility]. She states that she was not happy with their services.  Choice was offered for home hospice and pt chose [facility].  This CM contacted [facility] and left message that pt would like to be discharged from their services. [facility] called to give referral.  No additional needs noted at this time.  CM will continue to follow. CLEMENTS, NORA H, RN 05/04/2015, 2:31 PM  

## 2015-05-04 NOTE — Progress Notes (Signed)
Nutrition Follow-up  DOCUMENTATION CODES:   Severe malnutrition in context of chronic illness  INTERVENTION:   - Continue Ensure Enlive po TID, each supplement provides 350 kcals and 20 grams of protein. - Continue to encourage good PO intake of meals and supplements.  NUTRITION DIAGNOSIS:   Malnutrition related to chronic illness as evidenced by percent weight loss, energy intake < or equal to 75% for > or equal to 1 month, moderate depletions of muscle mass, moderate depletion of body fat.  Ongoing  GOAL:   Patient will meet greater than or equal to 90% of their needs  Progressing  MONITOR:   PO intake, Supplement acceptance, Weight trends, Labs  ASSESSMENT:   75 y.o. female with metastatic pancreatic cancer, on palliative chemotherapy. Presents with 2 day hx of generalized weakness, low grade fever, and fatigue.  Patient reports that her appetite has been pretty good while in the hospital compared to what she was eating PTA.  Pt with fluid retention and SOB on 3/9, but says she did not have any problems eating during this time. She states that she has been consuming about half of her meals, 50-75% meal completion per chart.  She has not been consuming the Ensure supplements, stating that she felt she ate well enough that she did not need them.  She says that she does not like Ensure Enlive as much as a different Ensure product that was given to her at the [facility].  Pt says she has the can at home and plans to purchase some once she is discharged from the hospital.  Gave pt coupons for purchasing Ensure.  Patient reports that she has not had nausea since admission.  Pt states that she was relying on anti-nausea medicine at home but has not needed this medication while in the hospital.   Dietetic Intern encouraged pt to prioritize her protein sources at meal time when she is taking her Creon capsules so she can meet her protein need.  Intern also recommended using Ensure to  swallow medicines to get in as much nutrition as possible.   Patient is not meeting currently meeting needs. Encouraged pt to continue consuming Ensure 2-3 times per day.  Medications reviewed: lipase/protease/amylase capsule. Labs reviewed: potassium WNL, low magnesium (1.5).  Diet Order:  Diet regular Room service appropriate?: Yes; Fluid consistency:: Thin  Skin:  Reviewed, no issues  Last BM:  3/7  Height:   Ht Readings from Last 1 Encounters:  04/30/15 4\' 10"  (1.473 m)    Weight:   Wt Readings from Last 1 Encounters:  04/30/15 141 lb (63.957 kg)    Ideal Body Weight:  43.9 kg  BMI:  Body mass index is 29.48 kg/(m^2).  Estimated Nutritional Needs:   Kcal:  1600-1800  Protein:  85-95 grams  Fluid:  1.6 - 1.8 L  EDUCATION NEEDS:   No education needs identified at this time  Veronda Prude, Dietetic Intern Pager: [phone]

## 2015-05-04 NOTE — Consult Note (Signed)
Goals of care at bedside with patient.  Her brother and sister-in-law were in attendance as well.  Tammy Schmitt is a very pleasant woman who has good insight into her medical condition including the poor prognosis for her stage of pancreatic cancer.  She has already determined DNR/DNI goals.  We discussed palliative care including the support of hospice for palliative symptom management and support of quality of life.  Tammy Schmitt lives with her son her in [place].  Previous to this admission she was able to perform most of her own ADL.  Her son supports her by doing laundry, shopping and cooking.  She has a pain management regimen of Fentanyl patch 3mcg with Dialudid 2mg  PO approx. 3 times daily with good control.  She also has antiemetic medication for PRN use although she reports that has not been an ongoing distressful problem.    Her determination was that she would welcome hospice at home for support and symptom management.  She is familiar with hospice philosophy, her daughter passed away a few years ago in [place] with hospice for support.  She had a very positive experience with hospice and understands the importance of quality of life and goal concordant care.    Referral will be made to hospice agency of her choice at discharge.  Will follow for assistance with transition of care as needed.  Kizzie Fantasia, RN-BC, MSN, [facility]

## 2015-05-04 NOTE — Consult Note (Signed)
CARDIOLOGY CONSULT NOTE       Patient ID: Tammy Schmitt MRN: [id number] DOB/AGE: January 31, 1941 75 y.o.  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 seen by Dr Acie Fredrickson 2015 after moving from [place] state Had distant CABG 2002 with RIMA to RCA and SVG circumflex PDA. EF by recent echo has been 30-35% Has had XRT and chemo for breast cancer. She is being Rx with palliative chemo for metastatic pancreatic cancer. Admitted with sepsis and shock ? UTI.  Had hypotension but Refused pressors wanted more comfort care.  Subsequent dyspnea from URI and ? Decompensated CHF after fluid.  BNP only 861  CXR yesterday with only bronchitic changes No edema.  Feels fine this am sitting in chair reading book   ROS All other systems reviewed and negative except as noted above  History reviewed. No pertinent past medical history.  Family History  Problem Relation Age of Onset  . Breast cancer Mother 77  . Heart disease Father   . Heart disease Brother   . Esophageal cancer Daughter 15    died at 37  . Leukemia Son 22  . Colon cancer Neg Hx   . Stomach cancer Neg Hx     Social History   Social History  . Marital Status: Divorced    Spouse Name: N/A  . Number of Children: 3  . Years of Education: N/A   Occupational History  . retired    Social History Main Topics  . Smoking status: Former Smoker -- 45 years    Types: Cigarettes    Quit date: 12/26/1999  . Smokeless  tobacco: Never Used  . Alcohol Use: No  . Drug Use: No  . Sexual Activity: Not on file   Other Topics Concern  . Not on file   Social History Narrative   Retired from working with disabled individuals    Two sons and one daughter (daughter and one son have [place]   Divorced, lives alone; moved to [place] from [place] May 2016   She likes to Psychologist, occupational and exercise.     Past Surgical History  Procedure Laterality Date  . Breast surgery Left     lymph nodes removed also  . Eye surgery Bilateral     cataracts removed  . Ovary surgery      one  tube removed , one ovary trimmed down  . Heart bypass      x 2, stent  . Coronary artery bypass graft  2001    x 2  . Cardiac catheterization  2001  . Eus N/A 03/08/2015    Procedure: UPPER ENDOSCOPIC ULTRASOUND (EUS) LINEAR;  Surgeon: Milus Banister, MD;  Location: [facility];  Service: Endoscopy;  Laterality: N/A;  radial linear  . Colonoscopy    . Laparoscopy N/A 04/11/2015    Procedure: LAPAROSCOPY DIAGNOSTIC;  Surgeon: Stark Klein, MD;  Location: [facility];  Service: General;  Laterality: N/A;     . aspirin EC  81 mg Oral Daily  .  doxycycline  100 mg Oral Q12H  . feeding supplement (ENSURE ENLIVE)  237 mL Oral TID WC  . fentaNYL  25 mcg Transdermal Q72H  . heparin  5,000 Units Subcutaneous 3 times per day  . ipratropium-albuterol  3 mL Nebulization BID  . lipase/protease/amylase  24,000 Units Oral TID WC  . mometasone-formoterol  2 puff Inhalation BID  . pantoprazole  80 mg Oral BID  . senna-docusate  2 tablet Oral BID  . sodium chloride flush  10-40 mL Intracatheter Q12H      Physical Exam: Blood pressure 123/72, pulse 105, temperature 99 F (37.2 C), temperature source Oral, resp. rate 16, height 4\' 10"  (1.473 m), weight 63.957 kg (141 lb), SpO2 95 %.    Affect appropriate Chronically ill white female  HEENT: normal Neck supple with no adenopathy JVP normal no bruits no thyromegaly  Right IJ porta cath  Lungs basilar crackles no  wheezing and good diaphragmatic motion Heart:  S1/S2 no murmur, no rub, gallop or click PMI normal Abdomen: benighn, BS positve, no tenderness, no AAA no bruit.  No HSM or HJR Distal pulses intact with no bruits No edema Neuro non-focal Skin warm and dry No muscular weakness   Labs:   Lab Results  Component Value Date   WBC 12.2* 05/03/2015   HGB 10.0* 05/03/2015   HCT 33.0* 05/03/2015   MCV 85.3 05/03/2015   PLT 457* 05/03/2015    Recent Labs Lab 04/30/15 1921  05/04/15 0500  NA 138  < > 137  K 3.1*  < > 4.2  CL 98*  < > 101  CO2 27  < > 29  BUN 13  < > 7  CREATININE 0.81  < > 0.61  CALCIUM 8.1*  < > 7.1*  PROT 6.2*  --   --   BILITOT 0.9  --   --   ALKPHOS 82  --   --   ALT 11*  --   --   AST 26  --   --   GLUCOSE 107*  < > 92  < > = values in this interval not displayed. No results found for: CKTOTAL, CKMB, CKMBINDEX, TROPONINI Lab Results  Component Value Date   CHOL 180 01/31/2015   CHOL 220* 11/08/2014   Lab Results  Component Value Date   HDL 41.00 01/31/2015   HDL 50.10 11/08/2014   Lab Results  Component Value Date   LDLCALC 109* 01/31/2015   LDLCALC 143* 11/08/2014   Lab Results  Component Value Date   TRIG 147.0 01/31/2015   TRIG 138.0 11/08/2014   Lab Results  Component Value Date   CHOLHDL 4 01/31/2015   CHOLHDL 4 11/08/2014   No results found for: LDLDIRECT    Radiology: Dg Chest 2 View  04/30/2015  CLINICAL DATA:  New onset fever. Nausea this morning. Patient on chemotherapy (most recent 5 days prior) for pancreatic cancer. EXAM: CHEST  2 VIEW COMPARISON:  Chest radiographs 04/11/2014, PET-CT 03/29/2015 FINDINGS: Tip of the right chest port in the SVC. Patient is post median sternotomy. Cardiomediastinal contours are unchanged. There is a moderate retrocardiac hiatal hernia. Linear density in the right midlung may be atelectasis, pleural thickening or minimal fluid in the minor fissure. No confluent airspace disease. Minimal blunting  of both costophrenic angles, may reflect small pleural effusions. No pneumothorax. Small pulmonary nodules on PET-CT not well seen radiographically. There are surgical clips in the left axilla. IMPRESSION: 1. No evidence of pneumonia. 2. Linear right midlung  opacity may be atelectasis, pleural thickening or fluid in the right minor fissure. 3. Question small pleural effusions. Electronically Signed   By: Jeb Levering M.D.   On: 04/30/2015 20:02   Ct Head Wo Contrast  05/03/2015  CLINICAL DATA:  Dizzy with unsteady gait.  Stroke-like symptoms. EXAM: CT HEAD WITHOUT CONTRAST TECHNIQUE: Contiguous axial images were obtained from the base of the skull through the vertex without intravenous contrast. COMPARISON:  None. FINDINGS: No evidence for acute infarction, hemorrhage, mass lesion, hydrocephalus, or extra-axial fluid. Mild cerebral and cerebellar atrophy. Hypoattenuation of white matter suggesting chronic microvascular ischemic change. Calvarium intact. No sinus or mastoid air fluid levels. Vascular calcification affects the carotid siphons and distal vertebral arteries. IMPRESSION: Mild atrophy. No acute intracranial findings. No mass lesion is identified. Electronically Signed   By: Staci Righter M.D.   On: 05/03/2015 20:06   Ct Abdomen Pelvis W Contrast  04/30/2015  CLINICAL DATA:  New onset fever and nausea. Abdominal tenderness. Recent diagnosis of pancreatic cancer. Most recent chemotherapy five days prior 04/26/2015. EXAM: CT ABDOMEN AND PELVIS WITH CONTRAST TECHNIQUE: Multidetector CT imaging of the abdomen and pelvis was performed using the standard protocol following bolus administration of intravenous contrast. CONTRAST:  166mL OMNIPAQUE IOHEXOL 300 MG/ML  SOLN COMPARISON:  PET-CT 10/16/2015.  Abdominal CT 02/09/2015 FINDINGS: Lower chest: Tiny bilateral pleural effusions with adjacent atelectasis at the lung bases. Hiatal hernia unchanged in size. Coronary artery calcifications and  atherosclerosis of the distal thoracic aorta. Liver: Scattered subcentimeter hypodensities throughout the liver, nonspecific, not significantly changed from prior exam. Hepatobiliary: Gallbladder physiologically distended, no calcified stone. There is a Phrygian cap. No biliary dilatation. Pancreas: Ill-defined hypodense lesion adjacent to the uncinate process of the pancreas and duodenum has diminished in size currently measuring 2.6 x 1.9 cm, previously 4.0 x 3.5 cm on prior CT. No pancreatic ductal dilatation. Spleen: No focal lesion. Adrenal glands: No nodule. Kidneys: Symmetric renal enhancement. No hydronephrosis. No perinephric stranding. Stomach/Bowel: Stomach is decompressed, hiatal hernia again seen. There are no dilated or thickened small bowel loops. Small volume of stool throughout the colon without colonic wall thickening. The appendix is not visualized. Vascular/Lymphatic: No retroperitoneal adenopathy. Abdominal aorta is normal in caliber. Dense atherosclerosis without aneurysm. Retroperitoneal mass encompasses the superior mesenteric artery as described previously. Omental nodularity and soft tissue density is, with minimal increase in the interim. For example, the anterior omental nodule measures 1.6 cm, previously 1.3 cm. The small bowel mesenteric nodules are difficult to differentiate from mesenteric ascites. Reproductive: Left ovarian mass again seen, 4.3 x 4.0 cm, not significantly changed allowing for differences in caliper placement. Uterus remains in situ. Right ovary not seen. Bladder: Completely decompressed. Other: Moderate volume of intra-abdominal and pelvic ascites, grossly unchanged to minimally increased from prior PET. Diffuse omental nodularity and soft tissue stranding. No free air. Musculoskeletal: There are no acute or suspicious osseous abnormalities. Degenerative change in the spine. IMPRESSION: 1. Decreased size of the soft tissue mass in the retroperitoneum, likely arising  from the uncinate process of the pancreas versus less likely transverse duodenum. There is however increased size of omental nodules from prior PET. Diffuse omental and peritoneal nodularity again seen. Volume of intra-abdominal and pelvic ascites is unchanged to minimally increased. 2. Small pleural effusions with adjacent atelectasis at the lung bases. 3. Left ovarian lesion, unchanged in the interim. 4. Hiatal hernia again seen. Additional chronic findings as described. Electronically Signed   By: Jeb Levering M.D.   On: 04/30/2015  22:11   Ir Fluoro Guide Cv Line Right  04/26/2015  ADDENDUM REPORT: 04/26/2015 09:54 ADDENDUM: Nursing monitored the patient during sedation. Electronically Signed   By: Marybelle Killings M.D.   On: 04/26/2015 09:54  04/26/2015  CLINICAL DATA:  Pancreatic cancer EXAM: TUNNEL POWER PORT PLACEMENT WITH SUBCUTANEOUS POCKET UTILIZING ULTRASOUND & FLOUROSCOPY FLUOROSCOPY TIME:  24 seconds MEDICATIONS AND MEDICAL HISTORY: Versed 1.5 mg, Fentanyl 50 mcg. Additional Medications: As antibiotic prophylaxis, Ancef was ordered pre-procedure and administered intravenously within one hour of incision. ANESTHESIA/SEDATION: Moderate sedation time: 25 minutes CONTRAST:  None PROCEDURE: After written informed consent was obtained, patient was placed in the supine position on angiographic table. The right neck and chest was prepped and draped in a sterile fashion. Lidocaine was utilized for local anesthesia. The right jugular vein was noted to be patent initially with ultrasound. Under sonographic guidance, a micropuncture needle was inserted into the right IJ vein (Ultrasound and fluoroscopic image documentation was performed). The needle was removed over an 018 wire which was exchanged for a Amplatz. This was advanced into the IVC. An 8-French dilator was advanced over the Amplatz. A small incision was made in the right upper chest over the anterior right second rib. Utilizing blunt dissection, a  subcutaneous pocket was created in the caudal direction. The pocket was irrigated with a copious amount of sterile normal saline. The port catheter was tunneled from the chest incision, and out the neck incision. The reservoir was inserted into the subcutaneous pocket and secured with two 3-0 Ethilon stitches. A peel-away sheath was advanced over the Amplatz wire. The port catheter was cut to measure length and inserted through the peel-away sheath. The peel-away sheath was removed. The chest incision was closed with 3-0 Vicryl interrupted stitches for the subcutaneous tissue and a running of 4-0 Vicryl subcuticular stitch for the skin. The neck incision was closed with a 4-0 Vicryl subcuticular stitch. Derma-bond was applied to both surgical incisions. The port reservoir was flushed and instilled with heparinized saline. No complications. FINDINGS: A right IJ vein Port-A-Cath is in place with its tip at the cavoatrial junction. COMPLICATIONS: None IMPRESSION: Successful 8 French right internal jugular vein power port placement with its tip at the SVC/RA junction. Electronically Signed: By: Marybelle Killings M.D. On: 04/18/2015 16:22   Ir US Guide Vasc Access Right  04/26/2015  ADDENDUM REPORT: 04/26/2015 09:54 ADDENDUM: Nursing monitored the patient during sedation. Electronically Signed   By: Marybelle Killings M.D.   On: 04/26/2015 09:54  04/26/2015  CLINICAL DATA:  Pancreatic cancer EXAM: TUNNEL POWER PORT PLACEMENT WITH SUBCUTANEOUS POCKET UTILIZING ULTRASOUND & FLOUROSCOPY FLUOROSCOPY TIME:  24 seconds MEDICATIONS AND MEDICAL HISTORY: Versed 1.5 mg, Fentanyl 50 mcg. Additional Medications: As antibiotic prophylaxis, Ancef was ordered pre-procedure and administered intravenously within one hour of incision. ANESTHESIA/SEDATION: Moderate sedation time: 25 minutes CONTRAST:  None PROCEDURE: After written informed consent was obtained, patient was placed in the supine position on angiographic table. The right neck and  chest was prepped and draped in a sterile fashion. Lidocaine was utilized for local anesthesia. The right jugular vein was noted to be patent initially with ultrasound. Under sonographic guidance, a micropuncture needle was inserted into the right IJ vein (Ultrasound and fluoroscopic image documentation was performed). The needle was removed over an 018 wire which was exchanged for a Amplatz. This was advanced into the IVC. An 8-French dilator was advanced over the Amplatz. A small incision was made in the right upper chest over the anterior  right second rib. Utilizing blunt dissection, a subcutaneous pocket was created in the caudal direction. The pocket was irrigated with a copious amount of sterile normal saline. The port catheter was tunneled from the chest incision, and out the neck incision. The reservoir was inserted into the subcutaneous pocket and secured with two 3-0 Ethilon stitches. A peel-away sheath was advanced over the Amplatz wire. The port catheter was cut to measure length and inserted through the peel-away sheath. The peel-away sheath was removed. The chest incision was closed with 3-0 Vicryl interrupted stitches for the subcutaneous tissue and a running of 4-0 Vicryl subcuticular stitch for the skin. The neck incision was closed with a 4-0 Vicryl subcuticular stitch. Derma-bond was applied to both surgical incisions. The port reservoir was flushed and instilled with heparinized saline. No complications. FINDINGS: A right IJ vein Port-A-Cath is in place with its tip at the cavoatrial junction. COMPLICATIONS: None IMPRESSION: Successful 8 French right internal jugular vein power port placement with its tip at the SVC/RA junction. Electronically Signed: By: Marybelle Killings M.D. On: 04/18/2015 16:22   Dg Chest Port 1 View  05/03/2015  CLINICAL DATA:  Cough for 1 day, shortness of Breath EXAM: PORTABLE CHEST 1 VIEW COMPARISON:  04/30/2015 FINDINGS: Cardiomegaly again noted. Stable linear atelectasis  or fluid in right minor fissure in right perihilar region. Right IJ Port-A-Cath is unchanged in position. Status post median sternotomy. Again noted surgical clips in left axilla. Mild left basilar atelectasis. Subtle mild perihilar bronchitic changes. No segmental infiltrate or pulmonary edema. IMPRESSION: No infiltrate or pulmonary edema. Subtle mild perihilar bronchitic changes. Status post median sternotomy. Stable right IJ port Port-A-Cath position. Again noted linear atelectasis or small amount of fluid in right minor fissure perihilar region. Mild left basilar atelectasis. Electronically Signed   By: Lahoma Crocker M.D.   On: 05/03/2015 08:51   Dg Chest Port 1 View  04/12/2015  CLINICAL DATA:  Oxygen desaturation EXAM: PORTABLE CHEST 1 VIEW COMPARISON:  03/29/2015 FINDINGS: Moderate to large hiatal hernia.  Mild cardiac enlargement. Mild left lower lobe atelectasis. Band of opacity right mid lung zone appears consistent with subsegmental atelectasis similar to prior study. IMPRESSION: Compare sent to CT scan, allowing for comparison of different modalities, shows no significant change with continued subsegmental atelectasis right mid lung zone. Electronically Signed   By: Skipper Cliche M.D.   On: 04/12/2015 13:47    EKG:  SR rate 93 normal    ASSESSMENT AND PLAN:  CAD:  Distant CABG  No angina continue asa resume beta blocker prior to d/c  Use coreg 3.125 bid  CHF:  Improved would continue low dose lasix.  Wrote for 10 mg PO daily  Can adjust based on I/O's  Would not start ACE given propensity for low BP/ shock and recent sepsis Pancreatic cancer:  DNR more comfort care and palliative chemo.  She will not live long enough to see long term benefit of ACE for heart  Signed: Jenkins Rouge 05/04/2015, 9:09 AM

## 2015-05-04 NOTE — Progress Notes (Signed)
PROGRESS NOTE    Tammy Schmitt  OTL:[id number]  DOB: 1940/09/05  DOA: 04/30/2015 PCP: [name], NP Outpatient Specialists:   Hospital course: 75 y.o. female with metastatic pancreatic cancer, patient is on palliative chemotherapy. Patient presents to the ED with 2 day history of generalized weakness, low grade fever, fatigue. In the ED she is septic with WBC 25k, fever 100.2, UTI found on UA. BP dropped to 70s, pressors started and PCCM was called. She has PMH of CAD, HLD, HTN.  After discussion with patient, patient decided against being on pressors, she wants primarily comfort measures and less invasive medical treatment. Pressors stopped and BP remained 97C systolic. She was admitted for sepsis secondary to presumed UTI. Urine culture negative. Flu panel PCR negative. De escalated antibiotics to oral doxycycline for acute bronchitis. Acute respiratory failure with hypoxia on 3/19-probably secondary to decompensated CHF and acute bronchitis. Responded well to IV Lasix. Possible DC home in the next 1-2 days.  Assessment & Plan:   SIRS/possible acute bronchitis - Patient met sepsis criteria on admission. Source of sepsis initially was felt to be due to UTI. She was briefly started on vasopressors for hypotension but patient declined pressors.  -  she was empirically started on IV vancomycin and Zosyn pending urine culture results. Hydrated with IV fluids.  - Blood cultures 2: Negative to date. Urine culture times one: Negative.  - SIRS physiology resolved. Unclear source of her SIRS-flu panel PCR neg. Repeat chest x-ray without pneumonia.? Acute bronchitis. - Chest x-ray without pneumonia. CT abdomen without acute findings but did show pancreatic malignancy and some ascites. - Discontinued IV vancomycin and Zosyn and started oral doxycycline to treat for acute bronchitis. - SIRS physiology resolved.   Anemia of chronic disease - Hemoglobin 6.7 on 05/01/15 which may have  been related to hemodilution rather than true drop in hemoglobin. She however received 2 units of PRBC and her hemoglobin has improved to 10.3. - Anemia secondary to metastatic malignancy and chemotherapy. Stable.  Dehydration - Resolved. Discontinued IV fluids 3/8.   Hypotension - May have been from dehydration. Resolved.  Hypokalemia - Replaced.   Hypomagnesemia - Replace and follow  Metastatic pancreatic cancer - Oncology follow-up appreciated. They plan to hold chemotherapy for now. Requested CT head for unsteady gait which was negative for metastatic disease.   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 Discontinued IV fluids on 3/8. Responded well to a dose of IV Lasix 60 mg 1 on 3/9. Monitor closely. - Reassess in a.m. for further diuresis. - Repeat 2-D echo-shows EF 30-35 percent. - Cardiology consultation appreciated. Continue low-dose Lasix. No ACEI due to advanced malignancy and no benefit due to limited life expectancy.  Acute respiratory failure with hypoxia  - Secondary to acute bronchitis and CHF. Treatment as above. Oxygen supplements.  CAD - sees Dr. Acie Fredrickson. Asymptomatic of chest pain.  Severe malnutrition in context of chronic illness - Management per dietitian consultation.   DVT prophylaxis:Subcutaneous heparin Code Status: DO NOT RESUSCITATE Family Communication: Discussed with patient's brother at bedside on 05/04/15. Disposition Plan: DC home when medically stable, possibly 3/11.    Consultants:  CCM-signed off  Oncology  Palliative care team  Procedures:  None   Antimicrobials:  IV Zosyn 3/6 > 3/8  IV vancomycin 3/6 > 3/9  Oral doxycycline 3/9 >  Subjective: States that she continues to feel much better than yesterday morning. No dyspnea. Cough improved or even resolved.  Objective: Filed Vitals:   05/04/15 0845 05/04/15 0852 05/04/15 0855 05/04/15 1040  BP:  112/69 118/73 123/72   Pulse: 92 100 105   Temp:      TempSrc:      Resp:      Height:      Weight:      SpO2:    97%  Respiratory rate 16/m, temperature 70F.  Intake/Output Summary (Last 24 hours) at 05/04/15 1507 Last data filed at 05/04/15 0840  Gross per 24 hour  Intake    480 ml  Output   1050 ml  Net   -570 ml   Filed Weights   04/30/15 1859 04/30/15 2019  Weight: 63.957 kg (141 lb) 63.957 kg (141 lb)    Exam:  General exam: Pleasant elderly female sitting up comfortably in chair this morning. Respiratory system: Much improved breath sounds. Clear to auscultation. No increased work of breathing. Cardiovascular system: S1 & S2 heard, RRR. No murmurs, gallops, clicks or pedal edema. No JVD. Gastrointestinal system: Abdomen is nondistended, soft and nontender. Normal bowel sounds heard. Central nervous system: Alert and oriented. No focal neurological deficits. Extremities: Symmetric 5 x 5 power.   Data Reviewed: Basic Metabolic Panel:  Recent Labs Lab 04/30/15 1921 05/01/15 0419 05/02/15 1145 05/03/15 0600 05/04/15 0500  NA 138 135 141 140 137  K 3.1* 3.2* 2.9* 4.0 4.2  CL 98* 102 105 106 101  CO2 '27 26 26 24 29  ' GLUCOSE 107* 113* 93 96 92  BUN '13 12 8 6 7  ' CREATININE 0.81 0.68 0.55 0.55 0.61  CALCIUM 8.1* 6.9* 7.2* 7.5* 7.1*  MG  --   --   --  1.3* 1.5*   Liver Function Tests:  Recent Labs Lab 04/30/15 1921  AST 26  ALT 11*  ALKPHOS 82  BILITOT 0.9  PROT 6.2*  ALBUMIN 2.5*   No results for input(s): LIPASE, AMYLASE in the last 168 hours. No results for input(s): AMMONIA in the last 168 hours. CBC:  Recent Labs Lab 04/30/15 1920 05/01/15 0419 05/02/15 1145 05/03/15 0600  WBC 25.3* 18.1* 13.2* 12.2*  NEUTROABS 21.3*  --   --   --   HGB 8.6* 6.7* 10.3* 10.0*  HCT 28.3* 22.1* 33.5* 33.0*  MCV 84.7 85.0 85.0 85.3  PLT 303 237 381 457*   Cardiac Enzymes: No results for input(s): CKTOTAL, CKMB, CKMBINDEX, TROPONINI in the last 168  hours. BNP (last 3 results) No results for input(s): PROBNP in the last 8760 hours. CBG: No results for input(s): GLUCAP in the last 168 hours.  Recent Results (from the past 240 hour(s))  Culture, blood (routine x 2)     Status: None (Preliminary result)   Collection Time: 04/30/15  7:20 PM  Result Value Ref Range Status   Specimen Description BLOOD RIGHT HAND  Final   Special Requests   Final    BOTTLES DRAWN AEROBIC AND ANAEROBIC 5CC BOTH BOTTLES   Culture   Final    NO GROWTH 3 DAYS Performed at [facility]    Report Status PENDING  Incomplete  Urine culture     Status: None   Collection Time: 04/30/15  8:19 PM  Result Value Ref Range Status   Specimen Description URINE, CATHETERIZED  Final   Special Requests NONE  Final   Culture   Final    NO GROWTH 1 DAY Performed at [facility]    Report Status 05/02/2015 FINAL  Final  Culture, blood (routine  x 2)     Status: None (Preliminary result)   Collection Time: 04/30/15  8:35 PM  Result Value Ref Range Status   Specimen Description BLOOD PORTA CATH  Final   Special Requests BOTTLES DRAWN AEROBIC AND ANAEROBIC 5 ML  Final   Culture   Final    NO GROWTH 3 DAYS Performed at [facility]    Report Status PENDING  Incomplete         Studies: Ct Head Wo Contrast  05/03/2015  CLINICAL DATA:  Dizzy with unsteady gait.  Stroke-like symptoms. EXAM: CT HEAD WITHOUT CONTRAST TECHNIQUE: Contiguous axial images were obtained from the base of the skull through the vertex without intravenous contrast. COMPARISON:  None. FINDINGS: No evidence for acute infarction, hemorrhage, mass lesion, hydrocephalus, or extra-axial fluid. Mild cerebral and cerebellar atrophy. Hypoattenuation of white matter suggesting chronic microvascular ischemic change. Calvarium intact. No sinus or mastoid air fluid levels. Vascular calcification affects the carotid siphons and distal vertebral arteries. IMPRESSION: Mild atrophy. No acute  intracranial findings. No mass lesion is identified. Electronically Signed   By: Staci Righter M.D.   On: 05/03/2015 20:06   Dg Chest Port 1 View  05/03/2015  CLINICAL DATA:  Cough for 1 day, shortness of Breath EXAM: PORTABLE CHEST 1 VIEW COMPARISON:  04/30/2015 FINDINGS: Cardiomegaly again noted. Stable linear atelectasis or fluid in right minor fissure in right perihilar region. Right IJ Port-A-Cath is unchanged in position. Status post median sternotomy. Again noted surgical clips in left axilla. Mild left basilar atelectasis. Subtle mild perihilar bronchitic changes. No segmental infiltrate or pulmonary edema. IMPRESSION: No infiltrate or pulmonary edema. Subtle mild perihilar bronchitic changes. Status post median sternotomy. Stable right IJ port Port-A-Cath position. Again noted linear atelectasis or small amount of fluid in right minor fissure perihilar region. Mild left basilar atelectasis. Electronically Signed   By: Lahoma Crocker M.D.   On: 05/03/2015 08:51        Scheduled Meds: . aspirin EC  81 mg Oral Daily  . doxycycline  100 mg Oral Q12H  . feeding supplement (ENSURE ENLIVE)  237 mL Oral TID WC  . fentaNYL  25 mcg Transdermal Q72H  . furosemide  10 mg Oral Daily  . heparin  5,000 Units Subcutaneous 3 times per day  . ipratropium-albuterol  3 mL Nebulization BID  . lipase/protease/amylase  24,000 Units Oral TID WC  . mometasone-formoterol  2 puff Inhalation BID  . pantoprazole  80 mg Oral BID  . senna-docusate  2 tablet Oral BID  . sodium chloride flush  10-40 mL Intracatheter Q12H   Continuous Infusions:    Principal Problem:   Septic shock due to urinary tract infection (HCC) Active Problems:   Pancreatic adenocarcinoma (HCC)   Peritoneal metastases (HCC)   Malignant neoplasm of pancreas (HCC)   Acute blood loss anemia   Anemia of chronic disease   Leukocytosis   Hypokalemia   SIRS (systemic inflammatory response syndrome) (HCC)   Protein-calorie malnutrition,  severe   Dizziness and giddiness   S/P CABG x 2   CHF (congestive heart failure) ([place])    Time spent: 30 minutes.    Vernell Leep, MD, FACP, FHM. [facility] Pager [phone]  If 7PM-7AM, please contact night-coverage [email or web address] Password [email or web address] 05/04/2015, 3:07 PM    LOS: 3 days

## 2015-05-04 NOTE — Care Management Important Message (Addendum)
Important Message  Patient Details IM Letter given to Nora/Case Manager to present to Patient Name: Tammy Schmitt MRN: [id number] Date of Birth: 07-06-1940   Medicare Important Message Given:  Yes    Camillo Flaming 05/04/2015, 1:56 PMImportant Message  Patient Details  Name: Tammy Schmitt MRN: [id number] Date of Birth: 08-14-1940   Medicare Important Message Given:  Yes    Camillo Flaming 05/04/2015, 1:56 PM

## 2015-05-05 DIAGNOSIS — J208 Acute bronchitis due to other specified organisms: Secondary | ICD-10-CM

## 2015-05-05 LAB — MAGNESIUM: Magnesium: 1.8 mg/dL (ref 1.7–2.4)

## 2015-05-05 MED ORDER — ENSURE ENLIVE PO LIQD: 237.0000 mL | Freq: Three times a day (TID) | ORAL | Status: AC

## 2015-05-05 MED ORDER — FUROSEMIDE 20 MG PO TABS: 10.0000 mg | ORAL_TABLET | Freq: Every day | ORAL | Status: AC

## 2015-05-05 MED ORDER — HEPARIN SOD (PORK) LOCK FLUSH 100 UNIT/ML IV SOLN
500.0000 [IU] | INTRAVENOUS | Status: AC | PRN
  Administered 2015-05-05: 500 [IU]
  Filled 2015-05-05: qty 5

## 2015-05-05 MED ORDER — ALBUTEROL SULFATE HFA 108 (90 BASE) MCG/ACT IN AERS: 1.0000 | INHALATION_SPRAY | RESPIRATORY_TRACT | Status: AC | PRN

## 2015-05-05 MED ORDER — MOMETASONE FURO-FORMOTEROL FUM 200-5 MCG/ACT IN AERO: 2.0000 | INHALATION_SPRAY | Freq: Two times a day (BID) | RESPIRATORY_TRACT | Status: AC

## 2015-05-05 MED ORDER — ALBUTEROL SULFATE (2.5 MG/3ML) 0.083% IN NEBU: 3.0000 mL | INHALATION_SOLUTION | RESPIRATORY_TRACT | Status: DC | PRN

## 2015-05-05 MED ORDER — DOXYCYCLINE HYCLATE 100 MG PO TABS: 100.0000 mg | ORAL_TABLET | Freq: Two times a day (BID) | ORAL | Status: DC

## 2015-05-05 MED ORDER — CARVEDILOL 3.125 MG PO TABS: 3.1250 mg | ORAL_TABLET | Freq: Two times a day (BID) | ORAL | Status: AC

## 2015-05-05 NOTE — Consult Note (Signed)
CARDIOLOGY CONSULT NOTE       Patient ID: Tammy Schmitt MRN: [id number] DOB/AGE: 05/05/1940 75 y.o.  Admit date: 04/30/2015 Referring Physician:  Algis Liming Primary Physician: [name], NP Primary Cardiologist:  Stetson Pelaez Reason for Consultation:  CHF  Principal Problem:   Septic shock due to urinary tract infection [facility]) Active Problems:   Pancreatic adenocarcinoma ([place])   Peritoneal metastases ([place])   Malignant neoplasm of pancreas ([place])   Acute blood loss anemia   Anemia of chronic disease   Leukocytosis   Hypokalemia   SIRS (systemic inflammatory response syndrome) (HCC)   Protein-calorie malnutrition, severe   Dizziness and giddiness   S/P CABG x 2   CHF (congestive heart failure) ([place])   HPI: 75 y.o. seen by me in 2016  after moving from [place] state Had distant CABG 2002 with RIMA to RCA and SVG circumflex PDA. EF by recent echo has been 30-35% Has had XRT and chemo for breast cancer. She is being Rx with palliative chemo for metastatic pancreatic cancer. Admitted with sepsis and shock ? UTI.  Had hypotension but Refused pressors wanted more comfort care.  Subsequent dyspnea from URI and ? Decompensated CHF after fluid.  BNP only 861  CXR yesterday with only bronchitic changes No edema.  Feels fine this am sitting in chair reading book   ROS All other systems reviewed and negative except as noted above  History reviewed. No pertinent past medical history.  Family History  Problem Relation Age of Onset  . Breast cancer Mother 39  . Heart disease Father   . Heart disease Brother   . Esophageal cancer Daughter 9    died at 61  . Leukemia Son 32  . Colon cancer Neg Hx   . Stomach cancer Neg Hx     Social History   Social History  . Marital Status: Divorced    Spouse Name: N/A  . Number of Children: 3  . Years of Education: N/A   Occupational History  . retired    Social History Main Topics  . Smoking status: Former Smoker -- 45 years    Types:  Cigarettes    Quit date: 12/26/1999  . Smokeless tobacco: Never Used  . Alcohol Use: No  . Drug Use: No  . Sexual Activity: Not on file   Other Topics Concern  . Not on file   Social History Narrative   Retired from working with disabled individuals    Two sons and one daughter (daughter and one son have Golf   Divorced, lives alone; moved to [place] from [place] May 2016   She likes to Psychologist, occupational and exercise.     Past Surgical History  Procedure Laterality Date  . Breast surgery Left     lymph nodes removed also  . Eye surgery Bilateral     cataracts removed  . Ovary surgery      one  tube removed , one ovary trimmed down  . Heart bypass      x 2, stent  . Coronary artery bypass graft  2001    x 2  . Cardiac catheterization  2001  . Eus N/A 03/08/2015    Procedure: UPPER ENDOSCOPIC ULTRASOUND (EUS) LINEAR;  Surgeon: Milus Banister, MD;  Location: [facility];  Service: Endoscopy;  Laterality: N/A;  radial linear  . Colonoscopy    . Laparoscopy N/A 04/11/2015    Procedure: LAPAROSCOPY DIAGNOSTIC;  Surgeon: Stark Klein, MD;  Location: [facility];  Service: General;  Laterality: N/A;     . aspirin EC  81 mg Oral Daily  . doxycycline  100 mg Oral Q12H  . feeding supplement (ENSURE ENLIVE)  237 mL Oral TID WC  . fentaNYL  25 mcg Transdermal Q72H  . furosemide  10 mg Oral Daily  . heparin  5,000 Units Subcutaneous 3 times per day  . ipratropium-albuterol  3 mL Nebulization BID  . lipase/protease/amylase  24,000 Units Oral TID WC  . mometasone-formoterol  2 puff Inhalation BID  . pantoprazole  80 mg Oral BID  . senna-docusate  2 tablet Oral BID  . sodium chloride flush  10-40 mL Intracatheter Q12H      Physical Exam: Blood pressure 109/57, pulse 96, temperature 98.4 F (36.9 C), temperature source Oral, resp. rate 17, height 4\' 10"  (1.473 m), weight 141 lb (63.957 kg), SpO2 95 %.    Affect appropriate Chronically ill white female  HEENT: normal Neck supple with no  adenopathy JVP normal no bruits no thyromegaly  Right IJ porta cath  Lungs basilar crackles no wheezing and good diaphragmatic motion Heart:  S1/S2 no murmur, no rub, gallop or click PMI normal Abdomen: benighn, BS positve, no tenderness, no AAA no bruit.  No HSM or HJR Distal pulses intact with no bruits No edema Neuro non-focal Skin warm and dry No muscular weakness   Labs:   Lab Results  Component Value Date   WBC 12.2* 05/03/2015   HGB 10.0* 05/03/2015   HCT 33.0* 05/03/2015   MCV 85.3 05/03/2015   PLT 457* 05/03/2015    Recent Labs Lab 04/30/15 1921  05/04/15 0500  NA 138  < > 137  K 3.1*  < > 4.2  CL 98*  < > 101  CO2 27  < > 29  BUN 13  < > 7  CREATININE 0.81  < > 0.61  CALCIUM 8.1*  < > 7.1*  PROT 6.2*  --   --   BILITOT 0.9  --   --   ALKPHOS 82  --   --   ALT 11*  --   --   AST 26  --   --   GLUCOSE 107*  < > 92  < > = values in this interval not displayed. No results found for: CKTOTAL, CKMB, CKMBINDEX, TROPONINI  Lab Results  Component Value Date   CHOL 180 01/31/2015   CHOL 220* 11/08/2014   Lab Results  Component Value Date   HDL 41.00 01/31/2015   HDL 50.10 11/08/2014   Lab Results  Component Value Date   LDLCALC 109* 01/31/2015   LDLCALC 143* 11/08/2014   Lab Results  Component Value Date   TRIG 147.0 01/31/2015   TRIG 138.0 11/08/2014   Lab Results  Component Value Date   CHOLHDL 4 01/31/2015   CHOLHDL 4 11/08/2014   No results found for: LDLDIRECT    Radiology:   EKG:  SR rate 93 normal    ASSESSMENT AND PLAN:  CAD:  Distant CABG  No angina continue asa resume beta blocker prior to d/c  Use coreg 3.125 bid  CHF:  Improved would continue low dose lasix.  Wrote for 10 mg PO daily  Can adjust based on I/O's  Would not start ACE given propensity for low BP/ shock and recent sepsis Pancreatic cancer:  DNR more comfort care and palliative chemo.  She will not live long enough to see long term benefit of ACE for heart  We had  a nice  visit today. I'm available if she needs to see me but at this time, I think palliative care can manage her symptoms .  Will sign off.   Signed: Thayer Headings 05/05/2015, 9:46 AM

## 2015-05-05 NOTE — Progress Notes (Signed)
Pt given d/c instructions, when to call MD/hospice, meds, f/u appts--Pt asks appropriate questions and verbalizes understanding.  Pt's prescriptions sent electronically to pharmacy.  Spoke to hospice RN who has set up hospice for pt at home.  Hospice RN called [facility] who is bringing oxygen for pt to take home.  Awaiting oxygen delivery, pt belongings packed, pt to be driven home by brother.  Currently pt states pain is at "okay" level.

## 2015-05-05 NOTE — Care Management Note (Signed)
Case Management Note  Patient Details  Name: Tammy Schmitt MRN: [id number] Date of Birth: 11/16/1940  Subjective/Objective:    Pancreatic adenocarcinoma               Action/Plan: 1519 Received call from [facility] liaison and they will do a have RN admission this evening. NCM spoke to pt and brother, Clair Gulling in room. Pt states [facility] has completed her referral. She was ready to go home. Son, Vonice Cromley will be in home to assist with care. Pt can afford medications.   1000 am NCM contacted [facility] to arrange dc home with Hospice. Spoke to [facility] [place] on Warehouse manager. Contacted [facility] for portable tank for home.   Expected Discharge Date:  05/05/2015              Expected Discharge Plan:  [place]  In-House Referral:  NA  Discharge planning Services  CM Consult  Post Acute Care Choice:  Hospice Choice offered to:  Patient  DME Arranged:  Oxygen DME Agency:  [place]:  RN [facility] Agency:  [place]  Status of Service:  Completed, signed off  Medicare Important Message Given:  Yes Date Medicare IM Given:    Medicare IM give by:    Date Additional Medicare IM Given:    Additional Medicare Important Message give by:     If discussed at [place] of Stay Meetings, dates discussed:    Additional Comments:  Erenest Rasher, RN 05/05/2015, 3:18 PM

## 2015-05-05 NOTE — Discharge Summary (Addendum)
Physician Discharge Summary  Tammy Schmitt  HMC:[id number]  DOB: 08-Nov-1940  DOA: 04/30/2015  PCP: [name], NP  Admit date: 04/30/2015 Discharge date: 05/05/2015  Time spent: Greater than 30 minutes  Recommendations for Outpatient Follow-up:  1. [name], NP/PCP in 5 days with repeat labs (CBC & BMP). Follow final blood culture results that were sent from the hospital. 2. Dr. Sullivan Lone, Oncology in 1 week. 3. Dr. Grayland Jack, Cardiology 4. Home Hospice referral made by Palliative team on 05/04/15.  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  Filed Weights   04/30/15 1859 04/30/15 2019  Weight: 63.957 kg (141 lb) 63.957 kg (141 lb)    History of present illness:  75 y.o. female with metastatic pancreatic cancer, patient is on palliative chemotherapy. Patient presents to the ED with 2 day history of generalized weakness, low grade fever, fatigue. In the ED she is septic with WBC 25k, fever 100.2, UTI found on UA. BP dropped to 70s, pressors started and PCCM was called. She has PMH of CAD, HLD, HTN.  After discussion with patient, patient decided against being on pressors, she wants primarily comfort measures and less invasive medical treatment. Pressors stopped and BP remained 66Q systolic. She was admitted for sepsis secondary to presumed UTI. Urine culture negative. Flu panel PCR negative. De escalated antibiotics to oral doxycycline for acute  bronchitis. Acute respiratory failure with hypoxia on 3/19-probably secondary to decompensated CHF and acute bronchitis. Responded well to IV Lasix. Improved.  Hospital Course:   SIRS/possible acute bronchitis - Patient met sepsis criteria on admission. Source of sepsis initially was felt to be due to UTI. She was briefly started on vasopressors for hypotension but patient declined pressors.  - she was empirically started on IV vancomycin and Zosyn pending urine culture results. Hydrated with IV fluids.  - Blood cultures 2: Negative to date. Urine culture times one: Negative.  - SIRS physiology resolved. Unclear source of her SIRS-flu panel PCR neg. Repeat chest x-ray without pneumonia.? Acute bronchitis. - Chest x-ray without pneumonia. CT abdomen without acute findings but did show pancreatic malignancy and some ascites. - Discontinued IV vancomycin and Zosyn and started oral doxycycline to treat for acute bronchitis. - SIRS physiology resolved.  - Clinically much improved.  Acute respiratory failure with hypoxia - Probably multifactorial related to acute bronchitis, decompensated CHF, possible underlying COPD from prior history of smoking, atelectasis and small pleural effusions. Discharge on home oxygen 1 L/m continuously and hopefully will be able to come off of it soon. Patient desaturated to 86% on room air at rest and 76-86 percent with activity on room air. Improved to 93% on 1 L/m nasal cannula oxygen.  Anemia of chronic disease - Hemoglobin 6.7 on 05/01/15 which may have been related to hemodilution rather than true drop in hemoglobin. She however received 2 units of PRBC and her hemoglobin has improved to 10.3. - Anemia secondary to metastatic malignancy and chemotherapy. Stable.  Dehydration - Resolved. Discontinued  IV fluids 3/8.   Hypotension - May have been from dehydration. Resolved.  Hypokalemia - Replaced.   Hypomagnesemia - Replaced  Metastatic pancreatic  cancer - Oncology follow-up appreciated. They plan to hold chemotherapy for now. Requested CT head for unsteady gait which was negative for metastatic disease. Gait has improved.  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 Discontinued IV fluids on 3/8. Responded well to a dose of IV Lasix 60 mg 1 on 3/9. Monitor closely. - Repeat 2-D echo-shows EF 30-35 percent. - Cardiology consultation appreciated. Continue low-dose Lasix. No ACEI due to advanced malignancy and no benefit due to limited life expectancy. At discharge, change beta blockers from metoprolol to carvedilol 3.125 MG twice a day as per cardiology advice.  CAD - sees Dr. Acie Fredrickson. Asymptomatic of chest pain.  Severe malnutrition in context of chronic illness - Management per dietitian consultation.  COPD - Possible COPD given history of tobacco abuse in the past. Has used Symbicort when necessary in the past but will switch to [facility] at discharge and when necessary albuterol inhaler. For now requires home oxygen. Reassess as outpatient.   Consultants:  CCM-signed off  Oncology  Palliative care team  Procedures:  None  Antimicrobials:  IV Zosyn 3/6 > 3/8  IV vancomycin 3/6 > 3/9  Oral doxycycline 3/9 >  Discharge Exam:  Complaints: Denies complaints. Anxious and eager to go home. Denies dyspnea. Occasional wheezing and asking for an inhaler at home. Leg swelling improved. Urinating well. Denies cough or chest pain. Gait and strength improved and denies unsteadiness or dizziness.  Filed Vitals:   05/04/15 1532 05/04/15 2033 05/05/15 0521 05/05/15 1006  BP: 130/87 122/58 109/57   Pulse: 117 100 96   Temp: 97.6 F (36.4 C) 99.4 F (37.4 C) 98.4 F (36.9 C)   TempSrc: Oral Oral Oral   Resp: 18  17   Height:      Weight:      SpO2: 90% 94% 95% 93%    General exam: Pleasant elderly female sitting up comfortably in chair this  morning. Respiratory system: Much improved breath sounds. Clear to auscultation. No increased work of breathing. Cardiovascular system: S1 & S2 heard, RRR. No murmurs, gallops, clicks or pedal edema. No JVD. Gastrointestinal system: Abdomen is nondistended, soft and nontender. Normal bowel sounds heard. Central nervous system: Alert and oriented. No focal neurological deficits. Extremities: Symmetric 5 x 5 power.  Discharge Instructions      Discharge Instructions    (HEART FAILURE PATIENTS) Call MD:  Anytime you have any of the following symptoms: 1) 3 pound weight gain in 24 hours or 5 pounds in 1 week 2) shortness of breath, with or without a dry hacking cough 3) swelling in the hands, feet or stomach 4) if you have to sleep on extra pillows at night in order to breathe.    Complete by:  As directed      Call MD for:  difficulty breathing, headache or visual disturbances    Complete by:  As directed      Call MD for:  extreme fatigue    Complete by:  As directed      Call MD for:  persistant dizziness or light-headedness    Complete by:  As directed      Call MD for:  persistant nausea and vomiting    Complete by:  As directed      Call MD for:  severe uncontrolled pain  Complete by:  As directed      Call MD for:  temperature >100.4    Complete by:  As directed      Diet - low sodium heart healthy    Complete by:  As directed      Discharge instructions    Complete by:  As directed   Oxygen via nasal cannula at 1 L/m continuously.     Increase activity slowly    Complete by:  As directed             Medication List    STOP taking these medications        budesonide-formoterol 160-4.5 MCG/ACT inhaler  Commonly known as:  SYMBICORT  Replaced by:  mometasone-formoterol 200-5 MCG/ACT Aero     dibucaine 1 % ointment  Commonly known as:  NUPERCAINAL     metoprolol tartrate 25 MG tablet  Commonly known as:  LOPRESSOR     prochlorperazine 10 MG tablet  Commonly known  as:  COMPAZINE      TAKE these medications        acetaminophen 500 MG tablet  Commonly known as:  TYLENOL  Take 1,000 mg by mouth 2 (two) times daily as needed for headache.     albuterol 108 (90 Base) MCG/ACT inhaler  Commonly known as:  PROVENTIL HFA;VENTOLIN HFA  Inhale 1-2 puffs into the lungs every 4 (four) hours as needed for wheezing or shortness of breath.     aspirin EC 81 MG tablet  Take 1 tablet (81 mg total) by mouth daily.     carvedilol 3.125 MG tablet  Commonly known as:  COREG  Take 1 tablet (3.125 mg total) by mouth 2 (two) times daily with a meal.     doxycycline 100 MG tablet  Commonly known as:  VIBRA-TABS  Take 1 tablet (100 mg total) by mouth 2 (two) times daily.     feeding supplement (ENSURE ENLIVE) Liqd  Take 237 mLs by mouth 3 (three) times daily with meals.     fentaNYL 25 MCG/HR patch  Commonly known as:  DURAGESIC - dosed mcg/hr  Place 1 patch (25 mcg total) onto the skin every 3 (three) days.     furosemide 20 MG tablet  Commonly known as:  LASIX  Take 0.5 tablets (10 mg total) by mouth daily.     HYDROmorphone 2 MG tablet  Commonly known as:  DILAUDID  Take 1 tablet (2 mg total) by mouth every 4 (four) hours as needed for severe pain.     lidocaine-prilocaine cream  Commonly known as:  EMLA  Apply to affected area once     LORazepam 0.5 MG tablet  Commonly known as:  ATIVAN  Take 1 tablet (0.5 mg total) by mouth every 6 (six) hours as needed (Nausea or vomiting).     mometasone-formoterol 200-5 MCG/ACT Aero  Commonly known as:  DULERA  Inhale 2 puffs into the lungs 2 (two) times daily.     nitroGLYCERIN 0.4 MG SL tablet  Commonly known as:  NITROSTAT  Place 0.4 mg under the tongue every 5 (five) minutes as needed for chest pain. Reported on 03/19/2015     omeprazole 40 MG capsule  Commonly known as:  PRILOSEC  Take 1 capsule (40 mg total) by mouth 2 (two) times daily.     ondansetron 8 MG tablet  Commonly known as:  ZOFRAN   Take 1 tablet (8 mg total) by mouth 2 (two) times daily as needed (Nausea  or vomiting).     Pancrelipase (Lip-Prot-Amyl) 24000 units Cpep  Take 1 capsule (24,000 Units total) by mouth 3 (three) times daily with meals.     REFRESH OP  Apply 1 drop to eye daily as needed (for dry eyes).     senna-docusate 8.6-50 MG tablet  Commonly known as:  SENNA S  Take 2 tablets by mouth 2 (two) times daily.     SYSTANE OP  Apply 1 drop to eye daily as needed (for dry eyes).       Follow-up Information    Follow up with [name], NP. Schedule an appointment as soon as possible for a visit in 5 days.   Specialty:  Family Medicine   Why:  To be seen with repeat labs (CBC & BMP).   Contact information:   [address] [place] [address] [phone]       Follow up with Sullivan Lone, MD. Schedule an appointment as soon as possible for a visit in 1 week.   Specialties:  Hematology, Oncology   Contact information:   [place] [address] [phone]       Schedule an appointment as soon as possible for a visit with Nahser, Wonda Cheng, MD.   Specialty:  Cardiology   Contact information:   [address] [place] [address] [phone]       Get Medicines reviewed and adjusted: Please take all your medications with you for your next visit with your Primary MD  Please request your Primary MD to go over all hospital tests and procedure/radiological results at the follow up. Please ask your Primary MD to get all Hospital records sent to his/her office.  If you experience worsening of your admission symptoms, develop shortness of breath, life threatening emergency, suicidal or homicidal thoughts you must seek medical attention immediately by calling 911 or calling your MD immediately if symptoms less severe.  You must read complete instructions/literature along with all the possible adverse reactions/side effects for all the Medicines you take  and that have been prescribed to you. Take any new Medicines after you have completely understood and accept all the possible adverse reactions/side effects.   Do not drive when taking pain medications.   Do not take more than prescribed Pain, Sleep and Anxiety Medications  Special Instructions: If you have smoked or chewed Tobacco in the last 2 yrs please stop smoking, stop any regular Alcohol and or any Recreational drug use.  Wear Seat belts while driving.  Please note  You were cared for by a hospitalist during your hospital stay. Once you are discharged, your primary care physician will handle any further medical issues. Please note that NO REFILLS for any discharge medications will be authorized once you are discharged, as it is imperative that you return to your primary care physician (or establish a relationship with a primary care physician if you do not have one) for your aftercare needs so that they can reassess your need for medications and monitor your lab values.    The results of significant diagnostics from this hospitalization (including imaging, microbiology, ancillary and laboratory) are listed below for reference.    Significant Diagnostic Studies: Dg Chest 2 View  04/30/2015  CLINICAL DATA:  New onset fever. Nausea this morning. Patient on chemotherapy (most recent 5 days prior) for pancreatic cancer. EXAM: CHEST  2 VIEW COMPARISON:  Chest radiographs 04/11/2014, PET-CT 03/29/2015 FINDINGS: Tip of the right chest port in  the SVC. Patient is post median sternotomy. Cardiomediastinal contours are unchanged. There is a moderate retrocardiac hiatal hernia. Linear density in the right midlung may be atelectasis, pleural thickening or minimal fluid in the minor fissure. No confluent airspace disease. Minimal blunting of both costophrenic angles, may reflect small pleural effusions. No pneumothorax. Small pulmonary nodules on PET-CT not well seen radiographically. There are  surgical clips in the left axilla. IMPRESSION: 1. No evidence of pneumonia. 2. Linear right midlung opacity may be atelectasis, pleural thickening or fluid in the right minor fissure. 3. Question small pleural effusions. Electronically Signed   By: Jeb Levering M.D.   On: 04/30/2015 20:02   Ct Head Wo Contrast  05/03/2015  CLINICAL DATA:  Dizzy with unsteady gait.  Stroke-like symptoms. EXAM: CT HEAD WITHOUT CONTRAST TECHNIQUE: Contiguous axial images were obtained from the base of the skull through the vertex without intravenous contrast. COMPARISON:  None. FINDINGS: No evidence for acute infarction, hemorrhage, mass lesion, hydrocephalus, or extra-axial fluid. Mild cerebral and cerebellar atrophy. Hypoattenuation of white matter suggesting chronic microvascular ischemic change. Calvarium intact. No sinus or mastoid air fluid levels. Vascular calcification affects the carotid siphons and distal vertebral arteries. IMPRESSION: Mild atrophy. No acute intracranial findings. No mass lesion is identified. Electronically Signed   By: Staci Righter M.D.   On: 05/03/2015 20:06   Ct Abdomen Pelvis W Contrast  04/30/2015  CLINICAL DATA:  New onset fever and nausea. Abdominal tenderness. Recent diagnosis of pancreatic cancer. Most recent chemotherapy five days prior 04/26/2015. EXAM: CT ABDOMEN AND PELVIS WITH CONTRAST TECHNIQUE: Multidetector CT imaging of the abdomen and pelvis was performed using the standard protocol following bolus administration of intravenous contrast. CONTRAST:  173m OMNIPAQUE IOHEXOL 300 MG/ML  SOLN COMPARISON:  PET-CT 10/16/2015.  Abdominal CT 02/09/2015 FINDINGS: Lower chest: Tiny bilateral pleural effusions with adjacent atelectasis at the lung bases. Hiatal hernia unchanged in size. Coronary artery calcifications and atherosclerosis of the distal thoracic aorta. Liver: Scattered subcentimeter hypodensities throughout the liver, nonspecific, not significantly changed from prior exam.  Hepatobiliary: Gallbladder physiologically distended, no calcified stone. There is a Phrygian cap. No biliary dilatation. Pancreas: Ill-defined hypodense lesion adjacent to the uncinate process of the pancreas and duodenum has diminished in size currently measuring 2.6 x 1.9 cm, previously 4.0 x 3.5 cm on prior CT. No pancreatic ductal dilatation. Spleen: No focal lesion. Adrenal glands: No nodule. Kidneys: Symmetric renal enhancement. No hydronephrosis. No perinephric stranding. Stomach/Bowel: Stomach is decompressed, hiatal hernia again seen. There are no dilated or thickened small bowel loops. Small volume of stool throughout the colon without colonic wall thickening. The appendix is not visualized. Vascular/Lymphatic: No retroperitoneal adenopathy. Abdominal aorta is normal in caliber. Dense atherosclerosis without aneurysm. Retroperitoneal mass encompasses the superior mesenteric artery as described previously. Omental nodularity and soft tissue density is, with minimal increase in the interim. For example, the anterior omental nodule measures 1.6 cm, previously 1.3 cm. The small bowel mesenteric nodules are difficult to differentiate from mesenteric ascites. Reproductive: Left ovarian mass again seen, 4.3 x 4.0 cm, not significantly changed allowing for differences in caliper placement. Uterus remains in situ. Right ovary not seen. Bladder: Completely decompressed. Other: Moderate volume of intra-abdominal and pelvic ascites, grossly unchanged to minimally increased from prior PET. Diffuse omental nodularity and soft tissue stranding. No free air. Musculoskeletal: There are no acute or suspicious osseous abnormalities. Degenerative change in the spine. IMPRESSION: 1. Decreased size of the soft tissue mass in the retroperitoneum, likely arising from the uncinate  process of the pancreas versus less likely transverse duodenum. There is however increased size of omental nodules from prior PET. Diffuse omental and  peritoneal nodularity again seen. Volume of intra-abdominal and pelvic ascites is unchanged to minimally increased. 2. Small pleural effusions with adjacent atelectasis at the lung bases. 3. Left ovarian lesion, unchanged in the interim. 4. Hiatal hernia again seen. Additional chronic findings as described. Electronically Signed   By: Jeb Levering M.D.   On: 04/30/2015 22:11   Ir Fluoro Guide Cv Line Right  04/26/2015  ADDENDUM REPORT: 04/26/2015 09:54 ADDENDUM: Nursing monitored the patient during sedation. Electronically Signed   By: Marybelle Killings M.D.   On: 04/26/2015 09:54  04/26/2015  CLINICAL DATA:  Pancreatic cancer EXAM: TUNNEL POWER PORT PLACEMENT WITH SUBCUTANEOUS POCKET UTILIZING ULTRASOUND & FLOUROSCOPY FLUOROSCOPY TIME:  24 seconds MEDICATIONS AND MEDICAL HISTORY: Versed 1.5 mg, Fentanyl 50 mcg. Additional Medications: As antibiotic prophylaxis, Ancef was ordered pre-procedure and administered intravenously within one hour of incision. ANESTHESIA/SEDATION: Moderate sedation time: 25 minutes CONTRAST:  None PROCEDURE: After written informed consent was obtained, patient was placed in the supine position on angiographic table. The right neck and chest was prepped and draped in a sterile fashion. Lidocaine was utilized for local anesthesia. The right jugular vein was noted to be patent initially with ultrasound. Under sonographic guidance, a micropuncture needle was inserted into the right IJ vein (Ultrasound and fluoroscopic image documentation was performed). The needle was removed over an 018 wire which was exchanged for a Amplatz. This was advanced into the IVC. An 8-French dilator was advanced over the Amplatz. A small incision was made in the right upper chest over the anterior right second rib. Utilizing blunt dissection, a subcutaneous pocket was created in the caudal direction. The pocket was irrigated with a copious amount of sterile normal saline. The port catheter was tunneled from the  chest incision, and out the neck incision. The reservoir was inserted into the subcutaneous pocket and secured with two 3-0 Ethilon stitches. A peel-away sheath was advanced over the Amplatz wire. The port catheter was cut to measure length and inserted through the peel-away sheath. The peel-away sheath was removed. The chest incision was closed with 3-0 Vicryl interrupted stitches for the subcutaneous tissue and a running of 4-0 Vicryl subcuticular stitch for the skin. The neck incision was closed with a 4-0 Vicryl subcuticular stitch. Derma-bond was applied to both surgical incisions. The port reservoir was flushed and instilled with heparinized saline. No complications. FINDINGS: A right IJ vein Port-A-Cath is in place with its tip at the cavoatrial junction. COMPLICATIONS: None IMPRESSION: Successful 8 French right internal jugular vein power port placement with its tip at the SVC/RA junction. Electronically Signed: By: Marybelle Killings M.D. On: 04/18/2015 16:22   Ir US Guide Vasc Access Right  04/26/2015  ADDENDUM REPORT: 04/26/2015 09:54 ADDENDUM: Nursing monitored the patient during sedation. Electronically Signed   By: Marybelle Killings M.D.   On: 04/26/2015 09:54  04/26/2015  CLINICAL DATA:  Pancreatic cancer EXAM: TUNNEL POWER PORT PLACEMENT WITH SUBCUTANEOUS POCKET UTILIZING ULTRASOUND & FLOUROSCOPY FLUOROSCOPY TIME:  24 seconds MEDICATIONS AND MEDICAL HISTORY: Versed 1.5 mg, Fentanyl 50 mcg. Additional Medications: As antibiotic prophylaxis, Ancef was ordered pre-procedure and administered intravenously within one hour of incision. ANESTHESIA/SEDATION: Moderate sedation time: 25 minutes CONTRAST:  None PROCEDURE: After written informed consent was obtained, patient was placed in the supine position on angiographic table. The right neck and chest was prepped and draped in a sterile  fashion. Lidocaine was utilized for local anesthesia. The right jugular vein was noted to be patent initially with ultrasound. Under  sonographic guidance, a micropuncture needle was inserted into the right IJ vein (Ultrasound and fluoroscopic image documentation was performed). The needle was removed over an 018 wire which was exchanged for a Amplatz. This was advanced into the IVC. An 8-French dilator was advanced over the Amplatz. A small incision was made in the right upper chest over the anterior right second rib. Utilizing blunt dissection, a subcutaneous pocket was created in the caudal direction. The pocket was irrigated with a copious amount of sterile normal saline. The port catheter was tunneled from the chest incision, and out the neck incision. The reservoir was inserted into the subcutaneous pocket and secured with two 3-0 Ethilon stitches. A peel-away sheath was advanced over the Amplatz wire. The port catheter was cut to measure length and inserted through the peel-away sheath. The peel-away sheath was removed. The chest incision was closed with 3-0 Vicryl interrupted stitches for the subcutaneous tissue and a running of 4-0 Vicryl subcuticular stitch for the skin. The neck incision was closed with a 4-0 Vicryl subcuticular stitch. Derma-bond was applied to both surgical incisions. The port reservoir was flushed and instilled with heparinized saline. No complications. FINDINGS: A right IJ vein Port-A-Cath is in place with its tip at the cavoatrial junction. COMPLICATIONS: None IMPRESSION: Successful 8 French right internal jugular vein power port placement with its tip at the SVC/RA junction. Electronically Signed: By: Marybelle Killings M.D. On: 04/18/2015 16:22   Dg Chest Port 1 View  05/03/2015  CLINICAL DATA:  Cough for 1 day, shortness of Breath EXAM: PORTABLE CHEST 1 VIEW COMPARISON:  04/30/2015 FINDINGS: Cardiomegaly again noted. Stable linear atelectasis or fluid in right minor fissure in right perihilar region. Right IJ Port-A-Cath is unchanged in position. Status post median sternotomy. Again noted surgical clips in left  axilla. Mild left basilar atelectasis. Subtle mild perihilar bronchitic changes. No segmental infiltrate or pulmonary edema. IMPRESSION: No infiltrate or pulmonary edema. Subtle mild perihilar bronchitic changes. Status post median sternotomy. Stable right IJ port Port-A-Cath position. Again noted linear atelectasis or small amount of fluid in right minor fissure perihilar region. Mild left basilar atelectasis. Electronically Signed   By: Lahoma Crocker M.D.   On: 05/03/2015 08:51   Dg Chest Port 1 View  04/12/2015  CLINICAL DATA:  Oxygen desaturation EXAM: PORTABLE CHEST 1 VIEW COMPARISON:  03/29/2015 FINDINGS: Moderate to large hiatal hernia.  Mild cardiac enlargement. Mild left lower lobe atelectasis. Band of opacity right mid lung zone appears consistent with subsegmental atelectasis similar to prior study. IMPRESSION: Compare sent to CT scan, allowing for comparison of different modalities, shows no significant change with continued subsegmental atelectasis right mid lung zone. Electronically Signed   By: Skipper Cliche M.D.   On: 04/12/2015 13:47    Microbiology: Recent Results (from the past 240 hour(s))  Culture, blood (routine x 2)     Status: None (Preliminary result)   Collection Time: 04/30/15  7:20 PM  Result Value Ref Range Status   Specimen Description BLOOD RIGHT HAND  Final   Special Requests   Final    BOTTLES DRAWN AEROBIC AND ANAEROBIC 5CC BOTH BOTTLES   Culture   Final    NO GROWTH 3 DAYS Performed at [facility]    Report Status PENDING  Incomplete  Urine culture     Status: None   Collection Time: 04/30/15  8:19 PM  Result Value Ref Range Status   Specimen Description URINE, CATHETERIZED  Final   Special Requests NONE  Final   Culture   Final    NO GROWTH 1 DAY Performed at [facility]    Report Status 05/02/2015 FINAL  Final  Culture, blood (routine x 2)     Status: None (Preliminary result)   Collection Time: 04/30/15  8:35 PM  Result Value Ref  Range Status   Specimen Description BLOOD PORTA CATH  Final   Special Requests BOTTLES DRAWN AEROBIC AND ANAEROBIC 5 ML  Final   Culture   Final    NO GROWTH 3 DAYS Performed at [facility]    Report Status PENDING  Incomplete     Labs: Basic Metabolic Panel:  Recent Labs Lab 04/30/15 1921 05/01/15 0419 05/02/15 1145 05/03/15 0600 05/04/15 0500 05/05/15 0500  NA 138 135 141 140 137  --   K 3.1* 3.2* 2.9* 4.0 4.2  --   CL 98* 102 105 106 101  --   CO2 '27 26 26 24 29  ' --   GLUCOSE 107* 113* 93 96 92  --   BUN '13 12 8 6 7  ' --   CREATININE 0.81 0.68 0.55 0.55 0.61  --   CALCIUM 8.1* 6.9* 7.2* 7.5* 7.1*  --   MG  --   --   --  1.3* 1.5* 1.8   Liver Function Tests:  Recent Labs Lab 04/30/15 1921  AST 26  ALT 11*  ALKPHOS 82  BILITOT 0.9  PROT 6.2*  ALBUMIN 2.5*   No results for input(s): LIPASE, AMYLASE in the last 168 hours. No results for input(s): AMMONIA in the last 168 hours. CBC:  Recent Labs Lab 04/30/15 1920 05/01/15 0419 05/02/15 1145 05/03/15 0600  WBC 25.3* 18.1* 13.2* 12.2*  NEUTROABS 21.3*  --   --   --   HGB 8.6* 6.7* 10.3* 10.0*  HCT 28.3* 22.1* 33.5* 33.0*  MCV 84.7 85.0 85.0 85.3  PLT 303 237 381 457*   Cardiac Enzymes: No results for input(s): CKTOTAL, CKMB, CKMBINDEX, TROPONINI in the last 168 hours. BNP: BNP (last 3 results)  Recent Labs  05/04/15 0500  BNP 861.0*    ProBNP (last 3 results) No results for input(s): PROBNP in the last 8760 hours.  CBG: No results for input(s): GLUCAP in the last 168 hours.     Signed:  Vernell Leep, MD, FACP, FHM. [facility] Pager [phone]  If 7PM-7AM, please contact night-coverage [email or web address] Password [facility] 05/05/2015, 12:47 PM

## 2015-05-05 NOTE — Care Management Important Message (Signed)
Important Message  Patient Details  Name: Tammy Schmitt MRN: [id number] Date of Birth: 08-25-1940   Medicare Important Message Given:  Yes    Erenest Rasher, RN 05/05/2015, 3:24 PM

## 2015-05-05 NOTE — Progress Notes (Signed)
Utilization review completed.  

## 2015-05-05 NOTE — Discharge Instructions (Signed)
Acute Bronchitis Bronchitis is inflammation of the airways that extend from the windpipe into the lungs (bronchi). The inflammation often causes mucus to develop. This leads to a cough, which is the most common symptom of bronchitis.  In acute bronchitis, the condition usually develops suddenly and goes away over time, usually in a couple weeks. Smoking, allergies, and asthma can make bronchitis worse. Repeated episodes of bronchitis may cause further lung problems.  CAUSES Acute bronchitis is most often caused by the same virus that causes a cold. The virus can spread from person to person (contagious) through coughing, sneezing, and touching contaminated objects. SIGNS AND SYMPTOMS   Cough.   Fever.   Coughing up mucus.   Body aches.   Chest congestion.   Chills.   Shortness of breath.   Sore throat.  DIAGNOSIS  Acute bronchitis is usually diagnosed through a physical exam. Your health care provider will also ask you questions about your medical history. Tests, such as chest X-rays, are sometimes done to rule out other conditions.  TREATMENT  Acute bronchitis usually goes away in a couple weeks. Oftentimes, no medical treatment is necessary. Medicines are sometimes given for relief of fever or cough. Antibiotic medicines are usually not needed but may be prescribed in certain situations. In some cases, an inhaler may be recommended to help reduce shortness of breath and control the cough. A cool mist vaporizer may also be used to help thin bronchial secretions and make it easier to clear the chest.  HOME CARE INSTRUCTIONS  Get plenty of rest.   Drink enough fluids to keep your urine clear or pale yellow (unless you have a medical condition that requires fluid restriction). Increasing fluids may help thin your respiratory secretions (sputum) and reduce chest congestion, and it will prevent dehydration.   Take medicines only as directed by your health care provider.  If  you were prescribed an antibiotic medicine, finish it all even if you start to feel better.  Avoid smoking and secondhand smoke. Exposure to cigarette smoke or irritating chemicals will make bronchitis worse. If you are a smoker, consider using nicotine gum or skin patches to help control withdrawal symptoms. Quitting smoking will help your lungs heal faster.   Reduce the chances of another bout of acute bronchitis by washing your hands frequently, avoiding people with cold symptoms, and trying not to touch your hands to your mouth, nose, or eyes.   Keep all follow-up visits as directed by your health care provider.  SEEK MEDICAL CARE IF: Your symptoms do not improve after 1 week of treatment.  SEEK IMMEDIATE MEDICAL CARE IF:  You develop an increased fever or chills.   You have chest pain.   You have severe shortness of breath.  You have bloody sputum.   You develop dehydration.  You faint or repeatedly feel like you are going to pass out.  You develop repeated vomiting.  You develop a severe headache. MAKE SURE YOU:   Understand these instructions.  Will watch your condition.  Will get help right away if you are not doing well or get worse.   This information is not intended to replace advice given to you by your health care provider. Make sure you discuss any questions you have with your health care provider.   Document Released: 03/20/2004 Document Revised: 03/03/2014 Document Reviewed: 08/03/2012 Elsevier Interactive Patient Education 2016 [employer]    Heart Failure Heart failure is a condition in which the heart has trouble pumping blood. This  means your heart does not pump blood efficiently for your body to work well. In some cases of heart failure, fluid may back up into your lungs or you may have swelling (edema) in your lower legs. Heart failure is usually a long-term (chronic) condition. It is important for you to take good care of yourself and follow  your health care provider's treatment plan. CAUSES  Some health conditions can cause heart failure. Those health conditions include:  High blood pressure (hypertension). Hypertension causes the heart muscle to work harder than normal. When pressure in the blood vessels is high, the heart needs to pump (contract) with more force in order to circulate blood throughout the body. High blood pressure eventually causes the heart to become stiff and weak.  Coronary artery disease (CAD). CAD is the buildup of cholesterol and fat (plaque) in the arteries of the heart. The blockage in the arteries deprives the heart muscle of oxygen and blood. This can cause chest pain and may lead to a heart attack. High blood pressure can also contribute to CAD.  Heart attack (myocardial infarction). A heart attack occurs when one or more arteries in the heart become blocked. The loss of oxygen damages the muscle tissue of the heart. When this happens, part of the heart muscle dies. The injured tissue does not contract as well and weakens the heart's ability to pump blood.  Abnormal heart valves. When the heart valves do not open and close properly, it can cause heart failure. This makes the heart muscle pump harder to keep the blood flowing.  Heart muscle disease (cardiomyopathy or myocarditis). Heart muscle disease is damage to the heart muscle from a variety of causes. These can include drug or alcohol abuse, infections, or unknown reasons. These can increase the risk of heart failure.  Lung disease. Lung disease makes the heart work harder because the lungs do not work properly. This can cause a strain on the heart, leading it to fail.  Diabetes. Diabetes increases the risk of heart failure. High blood sugar contributes to high fat (lipid) levels in the blood. Diabetes can also cause slow damage to tiny blood vessels that carry important nutrients to the heart muscle. When the heart does not get enough oxygen and food,  it can cause the heart to become weak and stiff. This leads to a heart that does not contract efficiently.  Other conditions can contribute to heart failure. These include abnormal heart rhythms, thyroid problems, and low blood counts (anemia). Certain unhealthy behaviors can increase the risk of heart failure, including:  Being overweight.  Smoking or chewing tobacco.  Eating foods high in fat and cholesterol.  Abusing illicit drugs or alcohol.  Lacking physical activity. SYMPTOMS  Heart failure symptoms may vary and can be hard to detect. Symptoms may include:  Shortness of breath with activity, such as climbing stairs.  Persistent cough.  Swelling of the feet, ankles, legs, or abdomen.  Unexplained weight gain.  Difficulty breathing when lying flat (orthopnea).  Waking from sleep because of the need to sit up and get more air.  Rapid heartbeat.  Fatigue and loss of energy.  Feeling light-headed, dizzy, or close to fainting.  Loss of appetite.  Nausea.  Increased urination during the night (nocturia). DIAGNOSIS  A diagnosis of heart failure is based on your history, symptoms, physical examination, and diagnostic tests. Diagnostic tests for heart failure may include:  Echocardiography.  Electrocardiography.  Chest X-ray.  Blood tests.  Exercise stress test.  Cardiac  angiography.  Radionuclide scans. TREATMENT  Treatment is aimed at managing the symptoms of heart failure. Medicines, behavioral changes, or surgical intervention may be necessary to treat heart failure.  Medicines to help treat heart failure may include:  Angiotensin-converting enzyme (ACE) inhibitors. This type of medicine blocks the effects of a blood protein called angiotensin-converting enzyme. ACE inhibitors relax (dilate) the blood vessels and help lower blood pressure.  Angiotensin receptor blockers (ARBs). This type of medicine blocks the actions of a blood protein called  angiotensin. Angiotensin receptor blockers dilate the blood vessels and help lower blood pressure.  Water pills (diuretics). Diuretics cause the kidneys to remove salt and water from the blood. The extra fluid is removed through urination. This loss of extra fluid lowers the volume of blood the heart pumps.  Beta blockers. These prevent the heart from beating too fast and improve heart muscle strength.  Digitalis. This increases the force of the heartbeat.  Healthy behavior changes include:  Obtaining and maintaining a healthy weight.  Stopping smoking or chewing tobacco.  Eating heart-healthy foods.  Limiting or avoiding alcohol.  Stopping illicit drug use.  Physical activity as directed by your health care provider.  Surgical treatment for heart failure may include:  A procedure to open blocked arteries, repair damaged heart valves, or remove damaged heart muscle tissue.  A pacemaker to improve heart muscle function and control certain abnormal heart rhythms.  An internal cardioverter defibrillator to treat certain serious abnormal heart rhythms.  A left ventricular assist device (LVAD) to assist the pumping ability of the heart. HOME CARE INSTRUCTIONS   Take medicines only as directed by your health care provider. Medicines are important in reducing the workload of your heart, slowing the progression of heart failure, and improving your symptoms.  Do not stop taking your medicine unless directed by your health care provider.  Do not skip any dose of medicine.  Refill your prescriptions before you run out of medicine. Your medicines are needed every day.  Engage in moderate physical activity if directed by your health care provider. Moderate physical activity can benefit some people. The elderly and people with severe heart failure should consult with a health care provider for physical activity recommendations.  Eat heart-healthy foods. Food choices should be free of  trans fat and low in saturated fat, cholesterol, and salt (sodium). Healthy choices include fresh or frozen fruits and vegetables, fish, lean meats, legumes, fat-free or low-fat dairy products, and whole grain or high fiber foods. Talk to a dietitian to learn more about heart-healthy foods.  Limit sodium if directed by your health care provider. Sodium restriction may reduce symptoms of heart failure in some people. Talk to a dietitian to learn more about heart-healthy seasonings.  Use healthy cooking methods. Healthy cooking methods include roasting, grilling, broiling, baking, poaching, steaming, or stir-frying. Talk to a dietitian to learn more about healthy cooking methods.  Limit fluids if directed by your health care provider. Fluid restriction may reduce symptoms of heart failure in some people.  Weigh yourself every day. Daily weights are important in the early recognition of excess fluid. You should weigh yourself every morning after you urinate and before you eat breakfast. Wear the same amount of clothing each time you weigh yourself. Record your daily weight. Provide your health care provider with your weight record.  Monitor and record your blood pressure if directed by your health care provider.  Check your pulse if directed by your health care provider.  Lose weight  if directed by your health care provider. Weight loss may reduce symptoms of heart failure in some people.  Stop smoking or chewing tobacco. Nicotine makes your heart work harder by causing your blood vessels to constrict. Do not use nicotine gum or patches before talking to your health care provider.  Keep all follow-up visits as directed by your health care provider. This is important.  Limit alcohol intake to no more than 1 drink per day for nonpregnant women and 2 drinks per day for men. One drink equals 12 ounces of beer, 5 ounces of wine, or 1 ounces of hard liquor. Drinking more than that is harmful to your  heart. Tell your health care provider if you drink alcohol several times a week. Talk with your health care provider about whether alcohol is safe for you. If your heart has already been damaged by alcohol or you have severe heart failure, drinking alcohol should be stopped completely.  Stop illicit drug use.  Stay up-to-date with immunizations. It is especially important to prevent respiratory infections through current pneumococcal and influenza immunizations.  Manage other health conditions such as hypertension, diabetes, thyroid disease, or abnormal heart rhythms as directed by your health care provider.  Learn to manage stress.  Plan rest periods when fatigued.  Learn strategies to manage high temperatures. If the weather is extremely hot:  Avoid vigorous physical activity.  Use air conditioning or fans or seek a cooler location.  Avoid caffeine and alcohol.  Wear loose-fitting, lightweight, and light-colored clothing.  Learn strategies to manage cold temperatures. If the weather is extremely cold:  Avoid vigorous physical activity.  Layer clothes.  Wear mittens or gloves, a hat, and a scarf when going outside.  Avoid alcohol.  Obtain ongoing education and support as needed.  Participate in or seek rehabilitation as needed to maintain or improve independence and quality of life. SEEK MEDICAL CARE IF:   You have a rapid weight gain.  You have increasing shortness of breath that is unusual for you.  You are unable to participate in your usual physical activities.  You tire easily.  You cough more than normal, especially with physical activity.  You have any or more swelling in areas such as your hands, feet, ankles, or abdomen.  You are unable to sleep because it is hard to breathe.  You feel like your heart is beating fast (palpitations).  You become dizzy or light-headed upon standing up. SEEK IMMEDIATE MEDICAL CARE IF:   You have difficulty  breathing.  There is a change in mental status such as decreased alertness or difficulty with concentration.  You have a pain or discomfort in your chest.  You have an episode of fainting (syncope). MAKE SURE YOU:   Understand these instructions.  Will watch your condition.  Will get help right away if you are not doing well or get worse.   This information is not intended to replace advice given to you by your health care provider. Make sure you discuss any questions you have with your health care provider.   Document Released: 02/10/2005 Document Revised: 06/27/2014 Document Reviewed: 03/12/2012 Elsevier Interactive Patient Education [employer].

## 2015-05-05 NOTE — Progress Notes (Signed)
At rest, pt on 1L [place] with sats 93%.   Oxygen removed, waited 5 mins, sats 86% RA at rest. Pt ambulated in hallway, sat range 76%-86% during walk. Pt returned to bed, 1L [place] applied, pt's sats recoved to 93%.  Pt denies SOB but is currently having exp wheezes.  RT called for neb tx.

## 2015-05-06 LAB — CULTURE, BLOOD (ROUTINE X 2)
Culture: NO GROWTH
Culture: NO GROWTH

## 2015-05-07 ENCOUNTER — Telehealth: Payer: Self-pay | Admitting: *Deleted

## 2015-05-07 NOTE — Telephone Encounter (Signed)
Oncology Nurse Navigator Documentation  Oncology Nurse Navigator Flowsheets 05/07/2015  Navigator Location [facility]  Navigator Encounter Type Telephone  Telephone Outgoing Call;Patient Update--spoke w/son. Hospice nurse and csw were with patient. Made him aware we'll get him called for a new appointment.  Abnormal Finding Date -  Treatment Initiated Date -  Patient Visit Type -  Treatment Phase -  Barriers/Navigation Needs Coordination of Care  Education -  Interventions Coordination of Care--POF to scheduler  Referrals -  Coordination of Care Appts w/ Dr. Irene Limbo  Support Groups/Services -  Acuity -  Time Spent with Patient 15

## 2015-05-08 ENCOUNTER — Telehealth: Payer: Self-pay | Admitting: Hematology

## 2015-05-08 NOTE — Telephone Encounter (Signed)
S.w. Pt and advised on March appt....the patient ok a;nd aware °

## 2015-05-11 ENCOUNTER — Telehealth: Payer: Self-pay | Admitting: *Deleted

## 2015-05-11 ENCOUNTER — Encounter: Payer: Self-pay | Admitting: Hematology

## 2015-05-11 ENCOUNTER — Telehealth: Payer: Self-pay | Admitting: Hematology

## 2015-05-11 ENCOUNTER — Ambulatory Visit (HOSPITAL_BASED_OUTPATIENT_CLINIC_OR_DEPARTMENT_OTHER): Payer: Medicare HMO | Admitting: Hematology

## 2015-05-11 VITALS — BP 118/68 | HR 103 | Temp 98.6°F | Resp 18 | Ht <= 58 in | Wt 138.6 lb

## 2015-05-11 DIAGNOSIS — G893 Neoplasm related pain (acute) (chronic): Secondary | ICD-10-CM | POA: Diagnosis not present

## 2015-05-11 DIAGNOSIS — C259 Malignant neoplasm of pancreas, unspecified: Secondary | ICD-10-CM

## 2015-05-11 DIAGNOSIS — D709 Neutropenia, unspecified: Secondary | ICD-10-CM | POA: Diagnosis not present

## 2015-05-11 DIAGNOSIS — C786 Secondary malignant neoplasm of retroperitoneum and peritoneum: Secondary | ICD-10-CM

## 2015-05-11 MED ORDER — LORAZEPAM 0.5 MG PO TABS: 0.5000 mg | ORAL_TABLET | Freq: Four times a day (QID) | ORAL | Status: DC | PRN

## 2015-05-11 NOTE — Telephone Encounter (Signed)
Per staff message and POF I have scheduled appts. Advised scheduler of appts and to move lab/flush JMW  

## 2015-05-11 NOTE — Telephone Encounter (Signed)
Pt confirmed labs/ov per 03/17 POF, gave pt AVS and Calendar.Cherylann Banas, sent msg to add chemo

## 2015-05-12 NOTE — Progress Notes (Signed)
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  She was initially thought to have a urinary tract infection and was treated with antibiotics.  Urine cultures were negative.  She completed a course of doxycycline for presumed bronchitis.  She also received 2 units of PRBCs for symptomatic anemia.  She had some issues with fluid overload that required diuresis.  She has now completed her oral antibiotics at home..  Notes fatigue.  Improving oral intake.  Her cycle 1 day 15 chemotherapy was due on 05/03/2015.  We have rescheduled this for the following week. Patient notes that she has changed her palliative care services agency and she'll be meeting them to define goals of care again this coming week.   She notes that she would like to focus on symptom control but does not want to stop pursuing chemotherapy at this time. Patient requested and was given a refill on her Ativan. Notes no abdominal distention.  Pain is very well controlled overall.  MEDICAL HISTORY:  . Patient Active Problem List   Diagnosis Date Noted  . S/P CABG x 2 05/04/2015  . CHF (congestive heart failure) ([place]) 05/04/2015  . Protein-calorie malnutrition, severe 05/03/2015  . Dizziness and giddiness   . Hypokalemia   . SIRS (systemic inflammatory response syndrome) ([facility])   . Septic shock due to urinary tract infection ([place]) 05/01/2015  . Acute blood loss anemia 05/01/2015  . Anemia of chronic disease 05/01/2015  . Leukocytosis 05/01/2015  . Malignant neoplasm of pancreas ([place])   .  Sepsis ([place])   . Constipation 04/26/2015  . Pancreatic adenocarcinoma ([place]) 04/14/2015  . Peritoneal metastases ([place]) 04/14/2015  . S/P laparoscopy 04/11/2015  . 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 Pancreatic mass 03/19/2015  . Ovarian mass, left 03/19/2015  . Omental mass   . CAD (coronary artery disease) 02/12/2015  . Hyperlipidemia 02/12/2015    SURGICAL HISTORY: Past Surgical History  Procedure Laterality Date  . Breast surgery Left     lymph nodes removed also  . Eye surgery Bilateral     cataracts removed  . Ovary surgery      one  tube removed , one ovary trimmed down  . Heart bypass      x 2, stent  . Coronary artery bypass graft  2001    x 2  . Cardiac catheterization  2001  . Eus N/A 03/08/2015    Procedure: UPPER ENDOSCOPIC ULTRASOUND (EUS) LINEAR;  Surgeon: Milus Banister, MD;  Location: [facility];  Service: Endoscopy;  Laterality: N/A;  radial linear  . Colonoscopy    . Laparoscopy N/A 04/11/2015    Procedure: LAPAROSCOPY DIAGNOSTIC;  Surgeon: Stark Klein, MD;  Location: [facility];  Service: General;  Laterality: N/A;    SOCIAL HISTORY: Social History   Social History  . Marital Status: Divorced    Spouse Name: N/A  . Number of Children: 3  . Years of Education: N/A   Occupational History  . retired    Social History Main Topics  . Smoking status: Former Smoker -- 45 years    Types:  Cigarettes    Quit date: 12/26/1999  . Smokeless tobacco: Never Used  . Alcohol Use: No  . Drug Use: No  . Sexual Activity: Not on file   Other Topics Concern  . Not on file   Social History Narrative   Retired from working with disabled individuals    Two sons and one daughter (daughter and one son have [place]   Divorced, lives alone; moved to [place] from [place] May 2016   She likes to Psychologist, occupational and exercise.     FAMILY HISTORY: Family History  Problem Relation Age of Onset  . Breast cancer Mother 17  . Heart disease Father   . Heart disease  Brother   . Esophageal cancer Daughter 76    died at 52  . Leukemia Son 42  . Colon cancer Neg Hx   . Stomach cancer Neg Hx     ALLERGIES:  is allergic to statins; ace inhibitors; codeine; and levaquin.  MEDICATIONS:  Current Outpatient Prescriptions  Medication Sig Dispense Refill  . acetaminophen (TYLENOL) 500 MG tablet Take 1,000 mg by mouth 2 (two) times daily as needed for headache.     . albuterol (PROVENTIL HFA;VENTOLIN HFA) 108 (90 Base) MCG/ACT inhaler Inhale 1-2 puffs into the lungs every 4 (four) hours as needed for wheezing or shortness of breath. 1 Inhaler 0  . aspirin EC 81 MG tablet Take 1 tablet (81 mg total) by mouth daily.    . carvedilol (COREG) 3.125 MG tablet Take 1 tablet (3.125 mg total) by mouth 2 (two) times daily with a meal. 60 tablet 0  . doxycycline (VIBRA-TABS) 100 MG tablet Take 1 tablet (100 mg total) by mouth 2 (two) times daily. 5 tablet 0  . feeding supplement, ENSURE ENLIVE, (ENSURE ENLIVE) LIQD Take 237 mLs by mouth 3 (three) times daily with meals. 237 mL 12  . fentaNYL (DURAGESIC - DOSED MCG/HR) 25 MCG/HR patch Place 1 patch (25 mcg total) onto the skin every 3 (three) days. 10 patch 0  . furosemide (LASIX) 20 MG tablet Take 0.5 tablets (10 mg total) by mouth daily. 30 tablet 0  . HYDROmorphone (DILAUDID) 2 MG tablet Take 1 tablet (2 mg total) by mouth every 4 (four) hours as needed for severe pain. 120 tablet 0  . lidocaine-prilocaine (EMLA) cream Apply to affected area once 30 g 3  . LORazepam (ATIVAN) 0.5 MG tablet Take 1 tablet (0.5 mg total) by mouth every 6 (six) hours as needed (Nausea or vomiting). 50 tablet 0  . mometasone-formoterol (DULERA) 200-5 MCG/ACT AERO Inhale 2 puffs into the lungs 2 (two) times daily. 1 Inhaler 0  . nitroGLYCERIN (NITROSTAT) 0.4 MG SL tablet Place 0.4 mg under the tongue every 5 (five) minutes as needed for chest pain. Reported on 03/19/2015    . omeprazole (PRILOSEC) 40 MG capsule Take 1 capsule (40 mg total) by  mouth 2 (two) times daily. 60 capsule 11  . ondansetron (ZOFRAN) 8 MG tablet Take 1 tablet (8 mg total) by mouth 2 (two) times daily as needed (Nausea or vomiting). 30 tablet 1  . Pancrelipase, Lip-Prot-Amyl, 24000 units CPEP Take 1 capsule (24,000 Units total) by mouth 3 (three) times daily with meals. 180 capsule 1  . Polyethyl Glycol-Propyl Glycol (SYSTANE OP) Apply 1 drop to eye daily as needed (for dry eyes).    . Polyvinyl Alcohol-Povidone (REFRESH OP) Apply 1 drop to eye daily as needed (for dry eyes).    Marland Kitchen senna-docusate (SENNA S) 8.6-50 MG tablet  Take 2 tablets by mouth 2 (two) times daily. 120 tablet 3   No current facility-administered medications for this visit.    REVIEW OF SYSTEMS:    10 Point review of Systems was done is negative except as noted above.  PHYSICAL EXAMINATION: ECOG PERFORMANCE STATUS: 1 - Symptomatic but completely ambulatory  . Filed Vitals:   05/11/15 1005  BP: 118/68  Pulse: 103  Temp: 98.6 F (37 C)  Resp: 18   Filed Weights   05/11/15 1005  Weight: 138 lb 9.6 oz (62.869 kg)   .Body mass index is 28.98 kg/(m^2).  GENERAL:alert, mild distress due to pain and uncertainty of diagnosis. SKIN: skin color, texture, turgor are normal, no rashes or significant lesions EYES: normal, conjunctiva are pink and non-injected, sclera clear OROPHARYNX:no exudate, no erythema and lips, buccal mucosa, and tongue normal  NECK: supple, no JVD, thyroid normal size, non-tender, without nodularity LYMPH:  no palpable lymphadenopathy in the cervical, axillary or inguinal LUNGS: clear to auscultation with normal respiratory effort HEART: regular rate & rhythm,  no murmurs and no lower extremity edema ABDOMEN: TTP epigastric regions, mild abdominal distention. normoactive BM , no rigidity or rebound. Healing laparoscopic surgical incisions. No open wounds. Musculoskeletal: no cyanosis of digits and no clubbing  PSYCH: alert & oriented x 3 with fluent speech NEURO:  no focal motor/sensory deficits  LABORATORY DATA:  I have reviewed the data as listed  . CBC Latest Ref Rng 05/03/2015 05/02/2015 05/01/2015  WBC 4.0 - 10.5 K/uL 12.2(H) 13.2(H) 18.1(H)  Hemoglobin 12.0 - 15.0 g/dL 10.0(L) 10.3(L) 6.7(LL)  Hematocrit 36.0 - 46.0 % 33.0(L) 33.5(L) 22.1(L)  Platelets 150 - 400 K/uL 457(H) 381 237   . CBC    Component Value Date/Time   WBC 12.2* 05/03/2015 0600   WBC 8.9 04/26/2015 0931   WBC 7.7 11/15/2014 1538   RBC 3.87 05/03/2015 0600   RBC 3.19* 04/26/2015 0931   RBC 5.07 11/15/2014 1538   HGB 10.0* 05/03/2015 0600   HGB 8.2* 04/26/2015 0931   HGB 14.3 11/15/2014 1538   HCT 33.0* 05/03/2015 0600   HCT 25.8* 04/26/2015 0931   HCT 44.2 11/15/2014 1538   PLT 457* 05/03/2015 0600   PLT 249 04/26/2015 0931   MCV 85.3 05/03/2015 0600   MCV 80.9 04/26/2015 0931   MCV 87.2 11/15/2014 1538   MCH 25.8* 05/03/2015 0600   MCH 25.7 04/26/2015 0931   MCH 28.1 11/15/2014 1538   MCHC 30.3 05/03/2015 0600   MCHC 31.8 04/26/2015 0931   MCHC 32.3 11/15/2014 1538   RDW 17.3* 05/03/2015 0600   RDW 14.7* 04/26/2015 0931   LYMPHSABS 2.0 04/30/2015 1920   LYMPHSABS 0.8* 04/26/2015 0931   MONOABS 2.0* 04/30/2015 1920   MONOABS 0.8 04/26/2015 0931   EOSABS 0.0 04/30/2015 1920   EOSABS 0.0 04/26/2015 0931   BASOSABS 0.0 04/30/2015 1920   BASOSABS 0.0 04/26/2015 0931    CMP Latest Ref Rng 05/04/2015 05/03/2015 05/02/2015  Glucose 65 - 99 mg/dL 92 96 93  BUN 6 - 20 mg/dL 7 6 8   Creatinine 0.44 - 1.00 mg/dL 0.61 0.55 0.55  Sodium 135 - 145 mmol/L 137 140 141  Potassium 3.5 - 5.1 mmol/L 4.2 4.0 2.9(L)  Chloride 101 - 111 mmol/L 101 106 105  CO2 22 - 32 mmol/L 29 24 26   Calcium 8.9 - 10.3 mg/dL 7.1(L) 7.5(L) 7.2(L)  Total Protein 6.5 - 8.1 g/dL - - -  Total Bilirubin 0.3 - 1.2 mg/dL - - -  Alkaline Phos 38 - 126 U/L - - -  AST 15 - 41 U/L - - -  ALT 14 - 54 U/L - - -    RADIOGRAPHIC STUDIES: I have personally reviewed the radiological images as listed and  agreed with the findings in the report. Dg Chest 2 View  04/30/2015  CLINICAL DATA:  New onset fever. Nausea this morning. Patient on chemotherapy (most recent 5 days prior) for pancreatic cancer. EXAM: CHEST  2 VIEW COMPARISON:  Chest radiographs 04/11/2014, PET-CT 03/29/2015 FINDINGS: Tip of the right chest port in the SVC. Patient is post median sternotomy. Cardiomediastinal contours are unchanged. There is a moderate retrocardiac hiatal hernia. Linear density in the right midlung may be atelectasis, pleural thickening or minimal fluid in the minor fissure. No confluent airspace disease. Minimal blunting of both costophrenic angles, may reflect small pleural effusions. No pneumothorax. Small pulmonary nodules on PET-CT not well seen radiographically. There are surgical clips in the left axilla. IMPRESSION: 1. No evidence of pneumonia. 2. Linear right midlung opacity may be atelectasis, pleural thickening or fluid in the right minor fissure. 3. Question small pleural effusions. Electronically Signed   By: Jeb Levering M.D.   On: 04/30/2015 20:02   Ct Head Wo Contrast  05/03/2015  CLINICAL DATA:  Dizzy with unsteady gait.  Stroke-like symptoms. EXAM: CT HEAD WITHOUT CONTRAST TECHNIQUE: Contiguous axial images were obtained from the base of the skull through the vertex without intravenous contrast. COMPARISON:  None. FINDINGS: No evidence for acute infarction, hemorrhage, mass lesion, hydrocephalus, or extra-axial fluid. Mild cerebral and cerebellar atrophy. Hypoattenuation of white matter suggesting chronic microvascular ischemic change. Calvarium intact. No sinus or mastoid air fluid levels. Vascular calcification affects the carotid siphons and distal vertebral arteries. IMPRESSION: Mild atrophy. No acute intracranial findings. No mass lesion is identified. Electronically Signed   By: Staci Righter M.D.   On: 05/03/2015 20:06   Ct Abdomen Pelvis W Contrast  04/30/2015  CLINICAL DATA:  New onset fever and  nausea. Abdominal tenderness. Recent diagnosis of pancreatic cancer. Most recent chemotherapy five days prior 04/26/2015. EXAM: CT ABDOMEN AND PELVIS WITH CONTRAST TECHNIQUE: Multidetector CT imaging of the abdomen and pelvis was performed using the standard protocol following bolus administration of intravenous contrast. CONTRAST:  1100mL OMNIPAQUE IOHEXOL 300 MG/ML  SOLN COMPARISON:  PET-CT 10/16/2015.  Abdominal CT 02/09/2015 FINDINGS: Lower chest: Tiny bilateral pleural effusions with adjacent atelectasis at the lung bases. Hiatal hernia unchanged in size. Coronary artery calcifications and atherosclerosis of the distal thoracic aorta. Liver: Scattered subcentimeter hypodensities throughout the liver, nonspecific, not significantly changed from prior exam. Hepatobiliary: Gallbladder physiologically distended, no calcified stone. There is a Phrygian cap. No biliary dilatation. Pancreas: Ill-defined hypodense lesion adjacent to the uncinate process of the pancreas and duodenum has diminished in size currently measuring 2.6 x 1.9 cm, previously 4.0 x 3.5 cm on prior CT. No pancreatic ductal dilatation. Spleen: No focal lesion. Adrenal glands: No nodule. Kidneys: Symmetric renal enhancement. No hydronephrosis. No perinephric stranding. Stomach/Bowel: Stomach is decompressed, hiatal hernia again seen. There are no dilated or thickened small bowel loops. Small volume of stool throughout the colon without colonic wall thickening. The appendix is not visualized. Vascular/Lymphatic: No retroperitoneal adenopathy. Abdominal aorta is normal in caliber. Dense atherosclerosis without aneurysm. Retroperitoneal mass encompasses the superior mesenteric artery as described previously. Omental nodularity and soft tissue density is, with minimal increase in the interim. For example, the anterior omental nodule measures 1.6 cm, previously 1.3 cm. The small bowel  mesenteric nodules are difficult to differentiate from mesenteric  ascites. Reproductive: Left ovarian mass again seen, 4.3 x 4.0 cm, not significantly changed allowing for differences in caliper placement. Uterus remains in situ. Right ovary not seen. Bladder: Completely decompressed. Other: Moderate volume of intra-abdominal and pelvic ascites, grossly unchanged to minimally increased from prior PET. Diffuse omental nodularity and soft tissue stranding. No free air. Musculoskeletal: There are no acute or suspicious osseous abnormalities. Degenerative change in the spine. IMPRESSION: 1. Decreased size of the soft tissue mass in the retroperitoneum, likely arising from the uncinate process of the pancreas versus less likely transverse duodenum. There is however increased size of omental nodules from prior PET. Diffuse omental and peritoneal nodularity again seen. Volume of intra-abdominal and pelvic ascites is unchanged to minimally increased. 2. Small pleural effusions with adjacent atelectasis at the lung bases. 3. Left ovarian lesion, unchanged in the interim. 4. Hiatal hernia again seen. Additional chronic findings as described. Electronically Signed   By: Jeb Levering M.D.   On: 04/30/2015 22:11   Ir Fluoro Guide Cv Line Right  04/26/2015  ADDENDUM REPORT: 04/26/2015 09:54 ADDENDUM: Nursing monitored the patient during sedation. Electronically Signed   By: Marybelle Killings M.D.   On: 04/26/2015 09:54  04/26/2015  CLINICAL DATA:  Pancreatic cancer EXAM: TUNNEL POWER PORT PLACEMENT WITH SUBCUTANEOUS POCKET UTILIZING ULTRASOUND & FLOUROSCOPY FLUOROSCOPY TIME:  24 seconds MEDICATIONS AND MEDICAL HISTORY: Versed 1.5 mg, Fentanyl 50 mcg. Additional Medications: As antibiotic prophylaxis, Ancef was ordered pre-procedure and administered intravenously within one hour of incision. ANESTHESIA/SEDATION: Moderate sedation time: 25 minutes CONTRAST:  None PROCEDURE: After written informed consent was obtained, patient was placed in the supine position on angiographic table. The right  neck and chest was prepped and draped in a sterile fashion. Lidocaine was utilized for local anesthesia. The right jugular vein was noted to be patent initially with ultrasound. Under sonographic guidance, a micropuncture needle was inserted into the right IJ vein (Ultrasound and fluoroscopic image documentation was performed). The needle was removed over an 018 wire which was exchanged for a Amplatz. This was advanced into the IVC. An 8-French dilator was advanced over the Amplatz. A small incision was made in the right upper chest over the anterior right second rib. Utilizing blunt dissection, a subcutaneous pocket was created in the caudal direction. The pocket was irrigated with a copious amount of sterile normal saline. The port catheter was tunneled from the chest incision, and out the neck incision. The reservoir was inserted into the subcutaneous pocket and secured with two 3-0 Ethilon stitches. A peel-away sheath was advanced over the Amplatz wire. The port catheter was cut to measure length and inserted through the peel-away sheath. The peel-away sheath was removed. The chest incision was closed with 3-0 Vicryl interrupted stitches for the subcutaneous tissue and a running of 4-0 Vicryl subcuticular stitch for the skin. The neck incision was closed with a 4-0 Vicryl subcuticular stitch. Derma-bond was applied to both surgical incisions. The port reservoir was flushed and instilled with heparinized saline. No complications. FINDINGS: A right IJ vein Port-A-Cath is in place with its tip at the cavoatrial junction. COMPLICATIONS: None IMPRESSION: Successful 8 French right internal jugular vein power port placement with its tip at the SVC/RA junction. Electronically Signed: By: Marybelle Killings M.D. On: 04/18/2015 16:22   Ir US Guide Vasc Access Right  04/26/2015  ADDENDUM REPORT: 04/26/2015 09:54 ADDENDUM: Nursing monitored the patient during sedation. Electronically Signed   By: Marybelle Killings  M.D.   On:  04/26/2015 09:54  04/26/2015  CLINICAL DATA:  Pancreatic cancer EXAM: TUNNEL POWER PORT PLACEMENT WITH SUBCUTANEOUS POCKET UTILIZING ULTRASOUND & FLOUROSCOPY FLUOROSCOPY TIME:  24 seconds MEDICATIONS AND MEDICAL HISTORY: Versed 1.5 mg, Fentanyl 50 mcg. Additional Medications: As antibiotic prophylaxis, Ancef was ordered pre-procedure and administered intravenously within one hour of incision. ANESTHESIA/SEDATION: Moderate sedation time: 25 minutes CONTRAST:  None PROCEDURE: After written informed consent was obtained, patient was placed in the supine position on angiographic table. The right neck and chest was prepped and draped in a sterile fashion. Lidocaine was utilized for local anesthesia. The right jugular vein was noted to be patent initially with ultrasound. Under sonographic guidance, a micropuncture needle was inserted into the right IJ vein (Ultrasound and fluoroscopic image documentation was performed). The needle was removed over an 018 wire which was exchanged for a Amplatz. This was advanced into the IVC. An 8-French dilator was advanced over the Amplatz. A small incision was made in the right upper chest over the anterior right second rib. Utilizing blunt dissection, a subcutaneous pocket was created in the caudal direction. The pocket was irrigated with a copious amount of sterile normal saline. The port catheter was tunneled from the chest incision, and out the neck incision. The reservoir was inserted into the subcutaneous pocket and secured with two 3-0 Ethilon stitches. A peel-away sheath was advanced over the Amplatz wire. The port catheter was cut to measure length and inserted through the peel-away sheath. The peel-away sheath was removed. The chest incision was closed with 3-0 Vicryl interrupted stitches for the subcutaneous tissue and a running of 4-0 Vicryl subcuticular stitch for the skin. The neck incision was closed with a 4-0 Vicryl subcuticular stitch. Derma-bond was applied to both  surgical incisions. The port reservoir was flushed and instilled with heparinized saline. No complications. FINDINGS: A right IJ vein Port-A-Cath is in place with its tip at the cavoatrial junction. COMPLICATIONS: None IMPRESSION: Successful 8 French right internal jugular vein power port placement with its tip at the SVC/RA junction. Electronically Signed: By: Marybelle Killings M.D. On: 04/18/2015 16:22   Dg Chest Port 1 View  05/03/2015  CLINICAL DATA:  Cough for 1 day, shortness of Breath EXAM: PORTABLE CHEST 1 VIEW COMPARISON:  04/30/2015 FINDINGS: Cardiomegaly again noted. Stable linear atelectasis or fluid in right minor fissure in right perihilar region. Right IJ Port-A-Cath is unchanged in position. Status post median sternotomy. Again noted surgical clips in left axilla. Mild left basilar atelectasis. Subtle mild perihilar bronchitic changes. No segmental infiltrate or pulmonary edema. IMPRESSION: No infiltrate or pulmonary edema. Subtle mild perihilar bronchitic changes. Status post median sternotomy. Stable right IJ port Port-A-Cath position. Again noted linear atelectasis or small amount of fluid in right minor fissure perihilar region. Mild left basilar atelectasis. Electronically Signed   By: Lahoma Crocker M.D.   On: 05/03/2015 08:51   Dg Chest Port 1 View  04/12/2015  CLINICAL DATA:  Oxygen desaturation EXAM: PORTABLE CHEST 1 VIEW COMPARISON:  03/29/2015 FINDINGS: Moderate to large hiatal hernia.  Mild cardiac enlargement. Mild left lower lobe atelectasis. Band of opacity right mid lung zone appears consistent with subsegmental atelectasis similar to prior study. IMPRESSION: Compare sent to CT scan, allowing for comparison of different modalities, shows no significant change with continued subsegmental atelectasis right mid lung zone. Electronically Signed   By: Skipper Cliche M.D.   On: 04/12/2015 13:47    ASSESSMENT & PLAN:   75 yo with  1) Stage IV metastatic Pancreatic Adenocarcinoma with  mesenteric/omental mets and concern for liver and possibly lung mets. EUS bx - atypical cells not definitive diagnosis of pancreatic adenocarcinoma -though this is the some likely diagnosis. CA 19-9 elevated. 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 laparoscopic surgical biopsy provided definitive diagnosis of pancreatic adenocarcinoma. Patient tolerated day 1 cycle 1 of gemcitabine and Abraxane quite well with no prohibitive toxicities.  She did not have any neutropenia but was admitted with some symptomatic anemia, acute bronchitis and subsequently had some fluid overload issues requiring diuresis.  She has completed doxycycline for her bronchitis.  Notes that she is feeling better though doesn't have the best appetite.  Pain is well-controlled.  . Wt Readings from Last 3 Encounters:  05/11/15 138 lb 9.6 oz (62.869 kg)  04/30/15 141 lb (63.957 kg)  04/26/15 141 lb 1.6 oz (64.003 kg)   Plan -we discussed her goals of care.  She notes that she certainly wants to keep her symptom control optimized. -She has home palliative care services.  Notes that she has changed agency after her recent hospitalization.  She notes that she meets with her palliative care program to determine further goals of care. -She clearly affirms to me that she does not want to stop palliative chemotherapy at this time. -encouraged better by mouth intake as well as nutritional supplements -we shall plan to repeat imaging and tumor markers after 2 cycles of treatment tolerance the patient denies any new symptoms.  2) Neoplasm related abdominal pain-well controlled at this time -Continue fentanyl patch and when necessary Dilaudid  3) Pancreatic exocrine deficiency -Continue Creon  #4 opiates related  constipation-resolved with adjustment of laxatives. Constipation is also partly related to her peritoneal metastases and painful hemorrhoids. Plan -continue current laxative regimen  4) significant h/o CAD on ASA. Systolic CHF and ejection fraction 30-35% -Continue beta blocker and aspirin as per cardiologist. -will need to be mindful of the possibility of fluid overload.  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 She notes that she was treated with a left sided lumpectomy, SLNB, adjuvant chemotherapy and RT and tamoxifen which she took only for 1 yr. -no current evidence of breast cancer recurrence.  RTC in next week for delayed cycle 1 day 15 of gemcitabine plus Abraxane and then a week later with with Dr Irene Limbo    Sullivan Lone MD [facility] Hematology/Oncology Physician [place]  (Office):       [phone] (Work cell):  [phone] (Fax):           [phone]

## 2015-05-15 ENCOUNTER — Ambulatory Visit: Payer: Medicare HMO

## 2015-05-15 ENCOUNTER — Other Ambulatory Visit (HOSPITAL_BASED_OUTPATIENT_CLINIC_OR_DEPARTMENT_OTHER): Payer: Medicare HMO

## 2015-05-15 ENCOUNTER — Ambulatory Visit (HOSPITAL_BASED_OUTPATIENT_CLINIC_OR_DEPARTMENT_OTHER): Payer: Medicare HMO

## 2015-05-15 ENCOUNTER — Other Ambulatory Visit: Payer: Self-pay | Admitting: Hematology

## 2015-05-15 ENCOUNTER — Other Ambulatory Visit: Payer: Self-pay | Admitting: *Deleted

## 2015-05-15 VITALS — BP 131/81 | HR 97 | Temp 97.8°F | Resp 18

## 2015-05-15 DIAGNOSIS — C786 Secondary malignant neoplasm of retroperitoneum and peritoneum: Secondary | ICD-10-CM

## 2015-05-15 DIAGNOSIS — R197 Diarrhea, unspecified: Secondary | ICD-10-CM

## 2015-05-15 DIAGNOSIS — Z95828 Presence of other vascular implants and grafts: Secondary | ICD-10-CM

## 2015-05-15 DIAGNOSIS — E876 Hypokalemia: Secondary | ICD-10-CM

## 2015-05-15 DIAGNOSIS — C259 Malignant neoplasm of pancreas, unspecified: Secondary | ICD-10-CM

## 2015-05-15 DIAGNOSIS — Z5111 Encounter for antineoplastic chemotherapy: Secondary | ICD-10-CM | POA: Diagnosis not present

## 2015-05-15 LAB — COMPREHENSIVE METABOLIC PANEL
ANION GAP: 13 meq/L — AB (ref 3–11)
AST: 18 U/L (ref 5–34)
Albumin: 2.2 g/dL — ABNORMAL LOW (ref 3.5–5.0)
Alkaline Phosphatase: 102 U/L (ref 40–150)
BILIRUBIN TOTAL: 0.51 mg/dL (ref 0.20–1.20)
BUN: 13.2 mg/dL (ref 7.0–26.0)
CALCIUM: 8.5 mg/dL (ref 8.4–10.4)
CO2: 31 meq/L — AB (ref 22–29)
CREATININE: 0.7 mg/dL (ref 0.6–1.1)
Chloride: 96 mEq/L — ABNORMAL LOW (ref 98–109)
EGFR: 80 mL/min/{1.73_m2} — ABNORMAL LOW (ref >90)
Glucose: 104 mg/dl (ref 70–140)
Potassium: 3 mEq/L — CL (ref 3.5–5.1)
Sodium: 140 mEq/L (ref 136–145)
TOTAL PROTEIN: 6.4 g/dL (ref 6.4–8.3)

## 2015-05-15 LAB — CBC WITH DIFFERENTIAL/PLATELET
BASO%: 0.2 % (ref 0.0–2.0)
Basophils Absolute: 0 10*3/uL (ref 0.0–0.1)
EOS%: 0.6 % (ref 0.0–7.0)
Eosinophils Absolute: 0.1 10*3/uL (ref 0.0–0.5)
HCT: 36.8 % (ref 34.8–46.6)
HGB: 11.2 g/dL — ABNORMAL LOW (ref 11.6–15.9)
LYMPH%: 11.8 % — ABNORMAL LOW (ref 14.0–49.7)
MCH: 25.5 pg (ref 25.1–34.0)
MCHC: 30.4 g/dL — ABNORMAL LOW (ref 31.5–36.0)
MCV: 83.8 fL (ref 79.5–101.0)
MONO#: 1.3 10*3/uL — AB (ref 0.1–0.9)
MONO%: 10.2 % (ref 0.0–14.0)
NEUT%: 77.2 % — ABNORMAL HIGH (ref 38.4–76.8)
NEUTROS ABS: 10.1 10*3/uL — AB (ref 1.5–6.5)
PLATELETS: 346 10*3/uL (ref 145–400)
RBC: 4.39 10*6/uL (ref 3.70–5.45)
RDW: 18.4 % — AB (ref 11.2–14.5)
WBC: 13.1 10*3/uL — ABNORMAL HIGH (ref 3.9–10.3)
lymph#: 1.6 10*3/uL (ref 0.9–3.3)

## 2015-05-15 MED ORDER — PACLITAXEL PROTEIN-BOUND CHEMO INJECTION 100 MG
100.0000 mg/m2 | Freq: Once | INTRAVENOUS | Status: AC
  Administered 2015-05-15: 175 mg via INTRAVENOUS
  Filled 2015-05-15: qty 35

## 2015-05-15 MED ORDER — POTASSIUM CHLORIDE CRYS ER 20 MEQ PO TBCR
40.0000 meq | EXTENDED_RELEASE_TABLET | Freq: Once | ORAL | Status: AC
  Administered 2015-05-15: 40 meq via ORAL
  Filled 2015-05-15: qty 2

## 2015-05-15 MED ORDER — SODIUM CHLORIDE 0.9 % IV SOLN
Freq: Once | INTRAVENOUS | Status: AC
  Administered 2015-05-15: 10:00:00 via INTRAVENOUS

## 2015-05-15 MED ORDER — POTASSIUM CHLORIDE 10 MEQ/100ML IV SOLN: 10.0000 meq | INTRAVENOUS | Status: DC

## 2015-05-15 MED ORDER — PALONOSETRON HCL INJECTION 0.25 MG/5ML
0.2500 mg | Freq: Once | INTRAVENOUS | Status: AC
  Administered 2015-05-15: 0.25 mg via INTRAVENOUS

## 2015-05-15 MED ORDER — SODIUM CHLORIDE 0.9% FLUSH
10.0000 mL | INTRAVENOUS | Status: DC | PRN
  Administered 2015-05-15: 10 mL
  Filled 2015-05-15: qty 10

## 2015-05-15 MED ORDER — SODIUM CHLORIDE 0.9% FLUSH
10.0000 mL | INTRAVENOUS | Status: DC | PRN
  Administered 2015-05-15: 10 mL via INTRAVENOUS
  Filled 2015-05-15: qty 10

## 2015-05-15 MED ORDER — SODIUM CHLORIDE 0.9 % IV SOLN
Freq: Once | INTRAVENOUS | Status: AC
  Administered 2015-05-15: 11:00:00 via INTRAVENOUS
  Filled 2015-05-15: qty 500

## 2015-05-15 MED ORDER — FENTANYL 25 MCG/HR TD PT72: 25.0000 ug | MEDICATED_PATCH | TRANSDERMAL | Status: AC

## 2015-05-15 MED ORDER — SODIUM CHLORIDE 0.9 % IV SOLN
10.0000 mg | Freq: Once | INTRAVENOUS | Status: AC
  Administered 2015-05-15: 10 mg via INTRAVENOUS
  Filled 2015-05-15: qty 1

## 2015-05-15 MED ORDER — POTASSIUM CHLORIDE ER 20 MEQ PO TBCR: 20.0000 meq | EXTENDED_RELEASE_TABLET | Freq: Every day | ORAL | Status: AC

## 2015-05-15 MED ORDER — HEPARIN SOD (PORK) LOCK FLUSH 100 UNIT/ML IV SOLN
500.0000 [IU] | Freq: Once | INTRAVENOUS | Status: AC | PRN
  Administered 2015-05-15: 500 [IU]
  Filled 2015-05-15: qty 5

## 2015-05-15 MED ORDER — FENTANYL 12 MCG/HR TD PT72: 12.5000 ug | MEDICATED_PATCH | TRANSDERMAL | Status: AC

## 2015-05-15 MED ORDER — PALONOSETRON HCL INJECTION 0.25 MG/5ML
INTRAVENOUS | Status: AC
  Filled 2015-05-15: qty 5

## 2015-05-15 MED ORDER — SODIUM CHLORIDE 0.9 % IV SOLN
1000.0000 mg/m2 | Freq: Once | INTRAVENOUS | Status: AC
  Administered 2015-05-15: 1634 mg via INTRAVENOUS
  Filled 2015-05-15: qty 42.98

## 2015-05-15 NOTE — Patient Instructions (Signed)

## 2015-05-15 NOTE — Patient Instructions (Addendum)
[facility] Discharge Instructions for Patients Receiving Chemotherapy  Today you received the following chemotherapy agents: Abraxane and Gemzar   To help prevent nausea and vomiting after your treatment, we encourage you to take your nausea medication as directed.    If you develop nausea and vomiting that is not controlled by your nausea medication, call the clinic.   BELOW ARE SYMPTOMS THAT SHOULD BE REPORTED IMMEDIATELY:  *FEVER GREATER THAN 100.5 F  *CHILLS WITH OR WITHOUT FEVER  NAUSEA AND VOMITING THAT IS NOT CONTROLLED WITH YOUR NAUSEA MEDICATION  *UNUSUAL SHORTNESS OF BREATH  *UNUSUAL BRUISING OR BLEEDING  TENDERNESS IN MOUTH AND THROAT WITH OR WITHOUT PRESENCE OF ULCERS  *URINARY PROBLEMS  *BOWEL PROBLEMS  UNUSUAL RASH Items with * indicate a potential emergency and should be followed up as soon as possible.  Feel free to call the clinic you have any questions or concerns. The clinic phone number is [phone].  Please show the [place] at check-in to the Emergency Department and triage nurse.     Hypokalemia Hypokalemia means that the amount of potassium in the blood is lower than normal.Potassium is a chemical, called an electrolyte, that helps regulate the amount of fluid in the body. It also stimulates muscle contraction and helps nerves function properly.Most of the body's potassium is inside of cells, and only a very small amount is in the blood. Because the amount in the blood is so small, minor changes can be life-threatening. CAUSES  Antibiotics.  Diarrhea or vomiting.  Using laxatives too much, which can cause diarrhea.  Chronic kidney disease.  Water pills (diuretics).  Eating disorders (bulimia).  Low magnesium level.  Sweating a lot. SIGNS AND SYMPTOMS  Weakness.  Constipation.  Fatigue.  Muscle cramps.  Mental confusion.  Skipped heartbeats or irregular heartbeat (palpitations).  Tingling or  numbness. DIAGNOSIS  Your health care provider can diagnose hypokalemia with blood tests. In addition to checking your potassium level, your health care provider may also check other lab tests. TREATMENT Hypokalemia can be treated with potassium supplements taken by mouth or adjustments in your current medicines. If your potassium level is very low, you may need to get potassium through a vein (IV) and be monitored in the hospital. A diet high in potassium is also helpful. Foods high in potassium are:  Nuts, such as peanuts and pistachios.  Seeds, such as sunflower seeds and pumpkin seeds.  Peas, lentils, and lima beans.  Whole grain and bran cereals and breads.  Fresh fruit and vegetables, such as apricots, avocado, bananas, cantaloupe, kiwi, oranges, tomatoes, asparagus, and potatoes.  Orange and tomato juices.  Red meats.  Fruit yogurt. HOME CARE INSTRUCTIONS  Take all medicines as prescribed by your health care provider.  Maintain a healthy diet by including nutritious food, such as fruits, vegetables, nuts, whole grains, and lean meats.  If you are taking a laxative, be sure to follow the directions on the label. SEEK MEDICAL CARE IF:  Your weakness gets worse.  You feel your heart pounding or racing.  You are vomiting or having diarrhea.  You are diabetic and having trouble keeping your blood glucose in the normal range. SEEK IMMEDIATE MEDICAL CARE IF:  You have chest pain, shortness of breath, or dizziness.  You are vomiting or having diarrhea for more than 2 days.  You faint. MAKE SURE YOU:   Understand these instructions.  Will watch your condition.  Will get help right away if you are not  doing well or get worse.   This information is not intended to replace advice given to you by your health care provider. Make sure you discuss any questions you have with your health care provider.   Document Released: 02/10/2005 Document Revised: 03/03/2014 Document  Reviewed: 08/13/2012 Elsevier Interactive Patient Education [employer].

## 2015-05-15 NOTE — Progress Notes (Signed)
Reviewed labs and assessment with Dr. Irene Limbo. Per Dr. Irene Limbo okay to proceed with treatment and prescription for potassium will be sent to patient's pharmacy, pt to receive oral and IV potassium while at clinic today and to collect stool sample for frequent diarrhea. Pt aware and verbalizes understanding.  Pt and VS stable at time of discharge. Pt aware and verbalizes understanding to pick up Potassium prescription from pharmacy.

## 2015-05-17 ENCOUNTER — Telehealth: Payer: Self-pay | Admitting: *Deleted

## 2015-05-17 ENCOUNTER — Other Ambulatory Visit: Payer: Self-pay | Admitting: Hematology

## 2015-05-17 DIAGNOSIS — R1115 Cyclical vomiting syndrome unrelated to migraine: Secondary | ICD-10-CM

## 2015-05-17 DIAGNOSIS — R112 Nausea with vomiting, unspecified: Secondary | ICD-10-CM | POA: Insufficient documentation

## 2015-05-17 MED ORDER — SCOPOLAMINE 1 MG/3DAYS TD PT72: 1.0000 | MEDICATED_PATCH | TRANSDERMAL | Status: AC | PRN

## 2015-05-17 MED ORDER — DEXAMETHASONE 4 MG PO TABS: 8.0000 mg | ORAL_TABLET | Freq: Every day | ORAL | Status: AC

## 2015-05-17 NOTE — Telephone Encounter (Signed)
Call received in [place] from pt's son, Cecilie Lowers, stating his mother has onset of vomiting x 1 this am and now continued nausea.  Per inquiry- vomiting occurred this am - pt then took 4 mg zofran.  She was able to eat cheerios for breakfast.  She then had reglan and at noon due to ongoing nausea she took ativan.  Nausea is still lingering and interfering with ability to hydrate well.  Per discussion with son, pt will take 8 mg zofran now.  Discussed need for increased hydration.  Informed Cecilie Lowers message will be sent to MD and RN at desk for review and return call for further recommendations.  Return call per this discussion given as [phone].

## 2015-05-17 NOTE — Telephone Encounter (Signed)
This is Dr. Grier Mitts patient

## 2015-05-17 NOTE — Telephone Encounter (Signed)
Corrected thank you 

## 2015-05-20 ENCOUNTER — Emergency Department (HOSPITAL_COMMUNITY): Payer: Medicare HMO

## 2015-05-20 ENCOUNTER — Inpatient Hospital Stay (HOSPITAL_COMMUNITY)
Admission: EM | Admit: 2015-05-20 | Discharge: 2015-05-26 | DRG: 435 | Disposition: A | Discharge status: Hospice | Payer: Medicare HMO | Attending: Internal Medicine | Admitting: Internal Medicine

## 2015-05-20 ENCOUNTER — Encounter (HOSPITAL_COMMUNITY): Payer: Self-pay

## 2015-05-20 DIAGNOSIS — R1110 Vomiting, unspecified: Secondary | ICD-10-CM

## 2015-05-20 DIAGNOSIS — K5903 Drug induced constipation: Secondary | ICD-10-CM | POA: Diagnosis present

## 2015-05-20 DIAGNOSIS — Z7952 Long term (current) use of systemic steroids: Secondary | ICD-10-CM

## 2015-05-20 DIAGNOSIS — Z881 Allergy status to other antibiotic agents status: Secondary | ICD-10-CM

## 2015-05-20 DIAGNOSIS — Z87891 Personal history of nicotine dependence: Secondary | ICD-10-CM

## 2015-05-20 DIAGNOSIS — Z803 Family history of malignant neoplasm of breast: Secondary | ICD-10-CM

## 2015-05-20 DIAGNOSIS — J209 Acute bronchitis, unspecified: Secondary | ICD-10-CM | POA: Diagnosis present

## 2015-05-20 DIAGNOSIS — Z888 Allergy status to other drugs, medicaments and biological substances status: Secondary | ICD-10-CM

## 2015-05-20 DIAGNOSIS — Z79899 Other long term (current) drug therapy: Secondary | ICD-10-CM

## 2015-05-20 DIAGNOSIS — E785 Hyperlipidemia, unspecified: Secondary | ICD-10-CM | POA: Diagnosis present

## 2015-05-20 DIAGNOSIS — G893 Neoplasm related pain (acute) (chronic): Secondary | ICD-10-CM | POA: Diagnosis present

## 2015-05-20 DIAGNOSIS — R197 Diarrhea, unspecified: Secondary | ICD-10-CM | POA: Diagnosis present

## 2015-05-20 DIAGNOSIS — I255 Ischemic cardiomyopathy: Secondary | ICD-10-CM | POA: Diagnosis present

## 2015-05-20 DIAGNOSIS — I2510 Atherosclerotic heart disease of native coronary artery without angina pectoris: Secondary | ICD-10-CM | POA: Diagnosis present

## 2015-05-20 DIAGNOSIS — Z7982 Long term (current) use of aspirin: Secondary | ICD-10-CM

## 2015-05-20 DIAGNOSIS — Z9221 Personal history of antineoplastic chemotherapy: Secondary | ICD-10-CM

## 2015-05-20 DIAGNOSIS — Z951 Presence of aortocoronary bypass graft: Secondary | ICD-10-CM

## 2015-05-20 DIAGNOSIS — C259 Malignant neoplasm of pancreas, unspecified: Secondary | ICD-10-CM | POA: Diagnosis not present

## 2015-05-20 DIAGNOSIS — Z800 Family history of malignant neoplasm of digestive organs: Secondary | ICD-10-CM

## 2015-05-20 DIAGNOSIS — I509 Heart failure, unspecified: Secondary | ICD-10-CM

## 2015-05-20 DIAGNOSIS — D638 Anemia in other chronic diseases classified elsewhere: Secondary | ICD-10-CM | POA: Diagnosis present

## 2015-05-20 DIAGNOSIS — Z806 Family history of leukemia: Secondary | ICD-10-CM

## 2015-05-20 DIAGNOSIS — R112 Nausea with vomiting, unspecified: Secondary | ICD-10-CM | POA: Diagnosis not present

## 2015-05-20 DIAGNOSIS — C786 Secondary malignant neoplasm of retroperitoneum and peritoneum: Secondary | ICD-10-CM

## 2015-05-20 DIAGNOSIS — T40605A Adverse effect of unspecified narcotics, initial encounter: Secondary | ICD-10-CM | POA: Diagnosis present

## 2015-05-20 DIAGNOSIS — C7800 Secondary malignant neoplasm of unspecified lung: Secondary | ICD-10-CM | POA: Diagnosis present

## 2015-05-20 DIAGNOSIS — Z885 Allergy status to narcotic agent status: Secondary | ICD-10-CM

## 2015-05-20 DIAGNOSIS — I5022 Chronic systolic (congestive) heart failure: Secondary | ICD-10-CM | POA: Diagnosis present

## 2015-05-20 DIAGNOSIS — T451X5A Adverse effect of antineoplastic and immunosuppressive drugs, initial encounter: Secondary | ICD-10-CM | POA: Diagnosis present

## 2015-05-20 DIAGNOSIS — Z66 Do not resuscitate: Secondary | ICD-10-CM | POA: Diagnosis present

## 2015-05-20 DIAGNOSIS — E43 Unspecified severe protein-calorie malnutrition: Secondary | ICD-10-CM | POA: Diagnosis present

## 2015-05-20 DIAGNOSIS — E876 Hypokalemia: Secondary | ICD-10-CM | POA: Diagnosis present

## 2015-05-20 DIAGNOSIS — Z8249 Family history of ischemic heart disease and other diseases of the circulatory system: Secondary | ICD-10-CM

## 2015-05-20 DIAGNOSIS — Z6826 Body mass index (BMI) 26.0-26.9, adult: Secondary | ICD-10-CM

## 2015-05-20 HISTORY — DX: Chronic systolic (congestive) heart failure: I50.22

## 2015-05-20 HISTORY — DX: Malignant neoplasm of pancreas, unspecified: C25.9

## 2015-05-20 LAB — CBC WITH DIFFERENTIAL/PLATELET
Basophils Absolute: 0 10*3/uL (ref 0.0–0.1)
Basophils Relative: 0 %
EOS PCT: 0 %
Eosinophils Absolute: 0 10*3/uL (ref 0.0–0.7)
HEMATOCRIT: 33.8 % — AB (ref 36.0–46.0)
Hemoglobin: 10.5 g/dL — ABNORMAL LOW (ref 12.0–15.0)
LYMPHS ABS: 0.5 10*3/uL — AB (ref 0.7–4.0)
LYMPHS PCT: 6 %
MCH: 25.7 pg — ABNORMAL LOW (ref 26.0–34.0)
MCHC: 31.1 g/dL (ref 30.0–36.0)
MCV: 82.6 fL (ref 78.0–100.0)
MONO ABS: 0.1 10*3/uL (ref 0.1–1.0)
MONOS PCT: 1 %
Neutro Abs: 6.8 10*3/uL (ref 1.7–7.7)
Neutrophils Relative %: 93 %
Platelets: 292 10*3/uL (ref 150–400)
RBC: 4.09 MIL/uL (ref 3.87–5.11)
RDW: 17.5 % — ABNORMAL HIGH (ref 11.5–15.5)
WBC: 7.3 10*3/uL (ref 4.0–10.5)

## 2015-05-20 LAB — COMPREHENSIVE METABOLIC PANEL
ALK PHOS: 92 U/L (ref 38–126)
ALT: 29 U/L (ref 14–54)
AST: 51 U/L — AB (ref 15–41)
Albumin: 2.8 g/dL — ABNORMAL LOW (ref 3.5–5.0)
Anion gap: 10 (ref 5–15)
BUN: 20 mg/dL (ref 6–20)
CHLORIDE: 96 mmol/L — AB (ref 101–111)
CO2: 30 mmol/L (ref 22–32)
CREATININE: 0.6 mg/dL (ref 0.44–1.00)
Calcium: 8.7 mg/dL — ABNORMAL LOW (ref 8.9–10.3)
GFR calc Af Amer: >60 mL/min (ref >60)
GFR calc non Af Amer: >60 mL/min (ref >60)
Glucose, Bld: 132 mg/dL — ABNORMAL HIGH (ref 65–99)
Potassium: 3.7 mmol/L (ref 3.5–5.1)
SODIUM: 136 mmol/L (ref 135–145)
Total Bilirubin: 0.7 mg/dL (ref 0.3–1.2)
Total Protein: 6.3 g/dL — ABNORMAL LOW (ref 6.5–8.1)

## 2015-05-20 LAB — LIPASE, BLOOD: Lipase: 26 U/L (ref 11–51)

## 2015-05-20 MED ORDER — ONDANSETRON HCL 4 MG/2ML IJ SOLN
4.0000 mg | Freq: Once | INTRAMUSCULAR | Status: AC
  Administered 2015-05-20: 4 mg via INTRAVENOUS
  Filled 2015-05-20: qty 2

## 2015-05-20 MED ORDER — ONDANSETRON HCL 4 MG/2ML IJ SOLN
4.0000 mg | Freq: Four times a day (QID) | INTRAMUSCULAR | Status: DC | PRN
  Administered 2015-05-21: 4 mg via INTRAVENOUS
  Filled 2015-05-20 (×2): qty 2

## 2015-05-20 MED ORDER — SODIUM CHLORIDE 0.9 % IV BOLUS (SEPSIS)
1000.0000 mL | Freq: Once | INTRAVENOUS | Status: AC
  Administered 2015-05-20: 1000 mL via INTRAVENOUS

## 2015-05-20 MED ORDER — FENTANYL 25 MCG/HR TD PT72
25.0000 ug | MEDICATED_PATCH | TRANSDERMAL | Status: DC
  Administered 2015-05-23 – 2015-05-26 (×2): 25 ug via TRANSDERMAL
  Filled 2015-05-20 (×2): qty 1

## 2015-05-20 MED ORDER — SCOPOLAMINE 1 MG/3DAYS TD PT72
1.0000 | MEDICATED_PATCH | TRANSDERMAL | Status: DC | PRN
  Filled 2015-05-20: qty 1

## 2015-05-20 MED ORDER — POTASSIUM CHLORIDE IN NACL 20-0.9 MEQ/L-% IV SOLN
INTRAVENOUS | Status: AC
  Administered 2015-05-21: 18:00:00 via INTRAVENOUS
  Filled 2015-05-20 (×2): qty 1000

## 2015-05-20 MED ORDER — LORAZEPAM 2 MG/ML IJ SOLN
0.5000 mg | Freq: Four times a day (QID) | INTRAMUSCULAR | Status: DC | PRN
  Administered 2015-05-21 – 2015-05-25 (×8): 0.5 mg via INTRAVENOUS
  Filled 2015-05-20 (×8): qty 1

## 2015-05-20 MED ORDER — HYDROMORPHONE HCL 1 MG/ML IJ SOLN
1.0000 mg | INTRAMUSCULAR | Status: DC | PRN
  Administered 2015-05-21 – 2015-05-25 (×10): 1 mg via INTRAVENOUS
  Filled 2015-05-20 (×11): qty 1

## 2015-05-20 MED ORDER — POLYETHYL GLYCOL-PROPYL GLYCOL 0.4-0.3 % OP GEL: Freq: Every day | OPHTHALMIC | Status: DC | PRN

## 2015-05-20 MED ORDER — ARTIFICIAL TEARS OP OINT
TOPICAL_OINTMENT | Freq: Every day | OPHTHALMIC | Status: DC | PRN
  Filled 2015-05-20: qty 3.5

## 2015-05-20 MED ORDER — FENTANYL 12 MCG/HR TD PT72
12.5000 ug | MEDICATED_PATCH | TRANSDERMAL | Status: DC
  Administered 2015-05-23 – 2015-05-26 (×2): 12.5 ug via TRANSDERMAL
  Filled 2015-05-20 (×2): qty 1

## 2015-05-20 MED ORDER — PANTOPRAZOLE SODIUM 40 MG IV SOLR
40.0000 mg | INTRAVENOUS | Status: DC
  Administered 2015-05-22 – 2015-05-23 (×2): 40 mg via INTRAVENOUS
  Filled 2015-05-20 (×5): qty 40

## 2015-05-20 MED ORDER — PROMETHAZINE HCL 25 MG/ML IJ SOLN
12.5000 mg | INTRAMUSCULAR | Status: DC | PRN
  Administered 2015-05-20: 12.5 mg via INTRAVENOUS
  Filled 2015-05-20: qty 1

## 2015-05-20 MED ORDER — NITROGLYCERIN 0.4 MG SL SUBL: 0.4000 mg | SUBLINGUAL_TABLET | SUBLINGUAL | Status: DC | PRN

## 2015-05-20 NOTE — ED Notes (Signed)
She c/o n/v--had chemo this past Tues.  She is awake, alert and in no distress.

## 2015-05-20 NOTE — ED Provider Notes (Signed)
CSN: [id number]     Arrival date & time 05/20/15  1406 History   First MD Initiated Contact with Patient 05/20/15 1519     Chief Complaint  Patient presents with  . Emesis      HPI Patient received a dose of chemotherapy for advanced pancreatic cancer on Tuesday.  She had vomiting the day after and received medication which seemed to resolve her symptoms.  Then she began vomiting again this morning.  She's had several episodes of vomiting.  Denies fever.  Last normal bowel movement was 2-3 days ago.  Denies significant pain. Past Medical History  Diagnosis Date  . Cancer ([place])   . Pancreatic cancer ([place])   . CAD (coronary artery disease)   . Systolic CHF, chronic ([place])   . Hyperlipidemia    Past Surgical History  Procedure Laterality Date  . Breast surgery Left     lymph nodes removed also  . Eye surgery Bilateral     cataracts removed  . Ovary surgery      one  tube removed , one ovary trimmed down  . Heart bypass      x 2, stent  . Coronary artery bypass graft  2001    x 2  . Cardiac catheterization  2001  . Eus N/A 03/08/2015    Procedure: UPPER ENDOSCOPIC ULTRASOUND (EUS) LINEAR;  Surgeon: Milus Banister, MD;  Location: [facility] ENDOSCOPY;  Service: Endoscopy;  Laterality: N/A;  radial linear  . Colonoscopy    . Laparoscopy N/A 04/11/2015    Procedure: LAPAROSCOPY DIAGNOSTIC;  Surgeon: Stark Klein, MD;  Location: [facility] OR;  Service: General;  Laterality: N/A;   Family History  Problem Relation Age of Onset  . Breast cancer Mother 83  . Heart disease Father   . Heart disease Brother   . Esophageal cancer Daughter 25    died at 40  . Leukemia Son 74  . Colon cancer Neg Hx   . Stomach cancer Neg Hx    Social History  Substance Use Topics  . Smoking status: Former Smoker -- 45 years    Types: Cigarettes    Quit date: 12/26/1999  . Smokeless tobacco: Never Used  . Alcohol Use: No   OB History    No data available     Review of Systems  All other systems reviewed  and are negative.     Allergies  Statins; Ace inhibitors; Codeine; and Levaquin  Home Medications   Prior to Admission medications   Medication Sig Start Date End Date Taking? Authorizing Provider  acetaminophen (TYLENOL) 500 MG tablet Take 1,000 mg by mouth 2 (two) times daily as needed for headache.    Yes Historical Provider, MD  albuterol (PROVENTIL HFA;VENTOLIN HFA) 108 (90 Base) MCG/ACT inhaler Inhale 1-2 puffs into the lungs every 4 (four) hours as needed for wheezing or shortness of breath. 05/05/15  Yes Modena Jansky, MD  aspirin EC 81 MG tablet Take 1 tablet (81 mg total) by mouth daily. 11/08/14  Yes Thayer Headings, MD  carvedilol (COREG) 3.125 MG tablet Take 1 tablet (3.125 mg total) by mouth 2 (two) times daily with a meal. 05/05/15  Yes Modena Jansky, MD  dexamethasone (DECADRON) 4 MG tablet Take 2 tablets (8 mg total) by mouth daily. For 3-4 days after chemotherapy for nausea 05/17/15  Yes Brunetta Genera, MD  feeding supplement, ENSURE ENLIVE, (ENSURE ENLIVE) LIQD Take 237 mLs by mouth 3 (three) times daily with meals. 05/05/15  Yes Modena Jansky, MD  fentaNYL (DURAGESIC - DOSED MCG/HR) 12 MCG/HR Place 1 patch (12.5 mcg total) onto the skin every 3 (three) days. Along with 25mcg/h patch 05/15/15  Yes Gautam Juleen China, MD  fentaNYL (DURAGESIC - DOSED MCG/HR) 25 MCG/HR patch Place 1 patch (25 mcg total) onto the skin every 3 (three) days. Along with 12.69mcg/h patch 05/15/15  Yes Gautam Juleen China, MD  furosemide (LASIX) 20 MG tablet Take 0.5 tablets (10 mg total) by mouth daily. 05/05/15  Yes Modena Jansky, MD  HYDROmorphone (DILAUDID) 2 MG tablet Take 1 tablet (2 mg total) by mouth every 4 (four) hours as needed for severe pain. 04/09/15  Yes Brunetta Genera, MD  lidocaine-prilocaine (EMLA) cream Apply to affected area once 04/17/15  Yes Gautam Juleen China, MD  LORazepam (ATIVAN) 0.5 MG tablet Take 1 tablet (0.5 mg total) by mouth every 6 (six) hours as needed  (Nausea or vomiting). 05/11/15  Yes Gautam Juleen China, MD  mometasone-formoterol [facility]) 200-5 MCG/ACT AERO Inhale 2 puffs into the lungs 2 (two) times daily. 05/05/15  Yes Modena Jansky, MD  nitroGLYCERIN (NITROSTAT) 0.4 MG SL tablet Place 0.4 mg under the tongue every 5 (five) minutes as needed for chest pain. Reported on 03/19/2015   Yes Historical Provider, MD  omeprazole (PRILOSEC) 40 MG capsule Take 1 capsule (40 mg total) by mouth 2 (two) times daily. 01/24/15  Yes Irene Shipper, MD  ondansetron (ZOFRAN) 8 MG tablet Take 1 tablet (8 mg total) by mouth 2 (two) times daily as needed (Nausea or vomiting). 04/17/15  Yes Brunetta Genera, MD  Pancrelipase, Lip-Prot-Amyl, 24000 units CPEP Take 1 capsule (24,000 Units total) by mouth 3 (three) times daily with meals. 03/22/15  Yes Brunetta Genera, MD  Polyethyl Glycol-Propyl Glycol (SYSTANE OP) Apply 1 drop to eye daily as needed (for dry eyes).   Yes Historical Provider, MD  potassium chloride 20 MEQ TBCR Take 20 mEq by mouth daily. 05/15/15  Yes Brunetta Genera, MD  scopolamine (TRANSDERM-SCOP) 1 MG/3DAYS Place 1 patch (1.5 mg total) onto the skin every 3 (three) days as needed (for nausea). 05/17/15  Yes Brunetta Genera, MD  senna-docusate (SENNA S) 8.6-50 MG tablet Take 2 tablets by mouth 2 (two) times daily. 04/17/15  Yes Brunetta Genera, MD  Polyvinyl Alcohol-Povidone (REFRESH OP) Apply 1 drop to eye daily as needed (for dry eyes).    Historical Provider, MD   BP 121/71 mmHg  Pulse 89  Temp(Src) 98.1 F (36.7 C) (Oral)  Resp 16  Ht 4\' 10"  (1.473 m)  Wt 128 lb 1.4 oz (58.1 kg)  BMI 26.78 kg/m2  SpO2 94% Physical Exam  Constitutional: She is oriented to person, place, and time. She appears well-developed and well-nourished. No distress.  HENT:  Head: Normocephalic and atraumatic.  Eyes: Pupils are equal, round, and reactive to light.  Neck: Normal range of motion.  Cardiovascular: Normal rate and intact distal pulses.    Pulmonary/Chest: No respiratory distress.  Abdominal: Soft. Normal appearance and bowel sounds are normal. She exhibits no distension. There is no tenderness. There is no rebound.  Musculoskeletal: Normal range of motion.  Neurological: She is alert and oriented to person, place, and time. No cranial nerve deficit.  Skin: Skin is warm and dry. No rash noted.  Nursing note and vitals reviewed.   ED Course  Procedures (including critical care time) Labs Review Labs Reviewed  COMPREHENSIVE METABOLIC PANEL - Abnormal; Notable for the  following:    Chloride 96 (*)    Glucose, Bld 132 (*)    Calcium 8.7 (*)    Total Protein 6.3 (*)    Albumin 2.8 (*)    AST 51 (*)    All other components within normal limits  CBC WITH DIFFERENTIAL/PLATELET - Abnormal; Notable for the following:    Hemoglobin 10.5 (*)    HCT 33.8 (*)    MCH 25.7 (*)    RDW 17.5 (*)    Lymphs Abs 0.5 (*)    All other components within normal limits  CBC WITH DIFFERENTIAL/PLATELET - Abnormal; Notable for the following:    RBC 3.81 (*)    Hemoglobin 9.8 (*)    HCT 31.5 (*)    MCH 25.7 (*)    RDW 17.7 (*)    All other components within normal limits  COMPREHENSIVE METABOLIC PANEL - Abnormal; Notable for the following:    Potassium 3.3 (*)    Calcium 8.3 (*)    Total Protein 5.3 (*)    Albumin 2.5 (*)    All other components within normal limits  CBC - Abnormal; Notable for the following:    WBC 3.8 (*)    RBC 3.59 (*)    Hemoglobin 9.4 (*)    HCT 30.1 (*)    RDW 17.8 (*)    All other components within normal limits  BASIC METABOLIC PANEL - Abnormal; Notable for the following:    Creatinine, Ser 0.39 (*)    Calcium 8.2 (*)    All other components within normal limits  CBC - Abnormal; Notable for the following:    RBC 3.57 (*)    Hemoglobin 9.1 (*)    HCT 29.1 (*)    MCH 25.5 (*)    RDW 17.6 (*)    Platelets 128 (*)    All other components within normal limits  BASIC METABOLIC PANEL - Abnormal; Notable  for the following:    Potassium 3.4 (*)    Calcium 8.1 (*)    All other components within normal limits  MAGNESIUM - Abnormal; Notable for the following:    Magnesium 1.3 (*)    All other components within normal limits  GASTROINTESTINAL PANEL BY PCR, STOOL (REPLACES STOOL CULTURE)  C DIFFICILE QUICK SCREEN W PCR REFLEX  LIPASE, BLOOD    Imaging Review No results found. I have personally reviewed and evaluated these images and lab results as part of my medical decision-making.    MDM   Final diagnoses:  Intractable vomiting with nausea, vomiting of unspecified type        Leonard Schwartz, MD 05/23/15 [phone]

## 2015-05-20 NOTE — ED Notes (Signed)
Patient transported to X-ray 

## 2015-05-20 NOTE — H&P (Signed)
[facility] History and Physical  RENNATA SELVAGGIO [id number] DOB: May 15, 1940 DOA: 05/20/2015  Referring physician: Leonard Schwartz, M.D. PCP: [name], NP   Chief Complaint: Nausea and vomiting.  HPI: Tammy Schmitt is a 75 y.o. female with a past medical history of pancreatic cancer, CAD, systolic CHF, hyperlipidemia who comes to the emergency department with complaints of nausea and emesis multiple times a day after she had chemotherapy this past Tuesday. She denies fever, chills, diarrhea, constipation, hematochezia or melena.  When seen, the patient was in no acute distress. She is stated she was feeling better after treatment. Workup in the emergency department shows hematology and chemistry labs similar to baseline and negative plain films of abdomen and chest.     Review of Systems:  Constitutional:  No weight loss, night sweats, Fevers, chills, fatigue.  HEENT:  No headaches, Difficulty swallowing,Tooth/dental problems,Sore throat,  No sneezing, itching, ear ache, nasal congestion, post nasal drip,  Cardio-vascular:  No chest pain, Orthopnea, PND, swelling in lower extremities, anasarca, dizziness, palpitations  GI:  Positive abdominal pain, nausea, vomiting,  No heartburn, indigestion,diarrhea, change in bowel habits, loss of appetite  Resp:  No shortness of breath with exertion or at rest. No excess mucus, no productive cough, No non-productive cough, No coughing up of blood.No change in color of mucus.No wheezing.No chest wall deformity  Skin:  no rash or lesions.  GU:  no dysuria, change in color of urine, no urgency or frequency. No flank pain.  Musculoskeletal:  No joint pain or swelling. No decreased range of motion. No back pain.  Psych:  No change in mood or affect. No depression or anxiety. No memory loss.   Past Medical History  Diagnosis Date  . Cancer ([place])   . Pancreatic cancer ([place])   . CAD (coronary artery disease)   . Systolic  CHF, chronic ([place])   . Hyperlipidemia    Past Surgical History  Procedure Laterality Date  . Breast surgery Left     lymph nodes removed also  . Eye surgery Bilateral     cataracts removed  . Ovary surgery      one  tube removed , one ovary trimmed down  . Heart bypass      x 2, stent  . Coronary artery bypass graft  2001    x 2  . Cardiac catheterization  2001  . Eus N/A 03/08/2015    Procedure: UPPER ENDOSCOPIC ULTRASOUND (EUS) LINEAR;  Surgeon: Milus Banister, MD;  Location: [facility];  Service: Endoscopy;  Laterality: N/A;  radial linear  . Colonoscopy    . Laparoscopy N/A 04/11/2015    Procedure: LAPAROSCOPY DIAGNOSTIC;  Surgeon: Stark Klein, MD;  Location: [facility];  Service: General;  Laterality: N/A;   Social History:  reports that she quit smoking about 15 years ago. Her smoking use included Cigarettes. She quit after 45 years of use. She has never used smokeless tobacco. She reports that she does not drink alcohol or use illicit drugs.  Allergies  Allergen Reactions  . Statins Other (See Comments)    Muscle pain    . Ace Inhibitors     From Previous Records  . Codeine Other (See Comments)    Doesn't feel well   . Levaquin [Levofloxacin In D5w]     From previous records    Family History  Problem Relation Age of Onset  . Breast cancer Mother 55  . Heart disease Father   . Heart disease Brother   .  Esophageal cancer Daughter 28    died at 49  . Leukemia Son 89  . Colon cancer Neg Hx   . Stomach cancer Neg Hx     Prior to Admission medications   Medication Sig Start Date End Date Taking? Authorizing Provider  acetaminophen (TYLENOL) 500 MG tablet Take 1,000 mg by mouth 2 (two) times daily as needed for headache.    Yes Historical Provider, MD  albuterol (PROVENTIL HFA;VENTOLIN HFA) 108 (90 Base) MCG/ACT inhaler Inhale 1-2 puffs into the lungs every 4 (four) hours as needed for wheezing or shortness of breath. 05/05/15  Yes Modena Jansky, MD  aspirin EC 81  MG tablet Take 1 tablet (81 mg total) by mouth daily. 11/08/14  Yes Thayer Headings, MD  carvedilol (COREG) 3.125 MG tablet Take 1 tablet (3.125 mg total) by mouth 2 (two) times daily with a meal. 05/05/15  Yes Modena Jansky, MD  dexamethasone (DECADRON) 4 MG tablet Take 2 tablets (8 mg total) by mouth daily. For 3-4 days after chemotherapy for nausea 05/17/15  Yes Brunetta Genera, MD  feeding supplement, ENSURE ENLIVE, (ENSURE ENLIVE) LIQD Take 237 mLs by mouth 3 (three) times daily with meals. 05/05/15  Yes Modena Jansky, MD  fentaNYL (DURAGESIC - DOSED MCG/HR) 12 MCG/HR Place 1 patch (12.5 mcg total) onto the skin every 3 (three) days. Along with 77mcg/h patch 05/15/15  Yes Gautam Juleen China, MD  fentaNYL (DURAGESIC - DOSED MCG/HR) 25 MCG/HR patch Place 1 patch (25 mcg total) onto the skin every 3 (three) days. Along with 12.32mcg/h patch 05/15/15  Yes Gautam Juleen China, MD  furosemide (LASIX) 20 MG tablet Take 0.5 tablets (10 mg total) by mouth daily. 05/05/15  Yes Modena Jansky, MD  HYDROmorphone (DILAUDID) 2 MG tablet Take 1 tablet (2 mg total) by mouth every 4 (four) hours as needed for severe pain. 04/09/15  Yes Brunetta Genera, MD  lidocaine-prilocaine (EMLA) cream Apply to affected area once 04/17/15  Yes Gautam Juleen China, MD  LORazepam (ATIVAN) 0.5 MG tablet Take 1 tablet (0.5 mg total) by mouth every 6 (six) hours as needed (Nausea or vomiting). 05/11/15  Yes Gautam Juleen China, MD  mometasone-formoterol [facility]) 200-5 MCG/ACT AERO Inhale 2 puffs into the lungs 2 (two) times daily. 05/05/15  Yes Modena Jansky, MD  nitroGLYCERIN (NITROSTAT) 0.4 MG SL tablet Place 0.4 mg under the tongue every 5 (five) minutes as needed for chest pain. Reported on 03/19/2015   Yes Historical Provider, MD  omeprazole (PRILOSEC) 40 MG capsule Take 1 capsule (40 mg total) by mouth 2 (two) times daily. 01/24/15  Yes Irene Shipper, MD  ondansetron (ZOFRAN) 8 MG tablet Take 1 tablet (8 mg total) by  mouth 2 (two) times daily as needed (Nausea or vomiting). 04/17/15  Yes Brunetta Genera, MD  Pancrelipase, Lip-Prot-Amyl, 24000 units CPEP Take 1 capsule (24,000 Units total) by mouth 3 (three) times daily with meals. 03/22/15  Yes Brunetta Genera, MD  Polyethyl Glycol-Propyl Glycol (SYSTANE OP) Apply 1 drop to eye daily as needed (for dry eyes).   Yes Historical Provider, MD  potassium chloride 20 MEQ TBCR Take 20 mEq by mouth daily. 05/15/15  Yes Brunetta Genera, MD  scopolamine (TRANSDERM-SCOP) 1 MG/3DAYS Place 1 patch (1.5 mg total) onto the skin every 3 (three) days as needed (for nausea). 05/17/15  Yes Brunetta Genera, MD  senna-docusate (SENNA S) 8.6-50 MG tablet Take 2 tablets by mouth 2 (two) times  daily. 04/17/15  Yes Brunetta Genera, MD  Polyvinyl Alcohol-Povidone (REFRESH OP) Apply 1 drop to eye daily as needed (for dry eyes).    Historical Provider, MD   Physical Exam: Filed Vitals:   05/20/15 1415 05/20/15 1806 05/20/15 2112 05/20/15 2134  BP: 113/74 134/74 118/61 152/62  Pulse: 95 93 90 99  Temp: 98.1 F (36.7 C)  98.8 F (37.1 C) 98.4 F (36.9 C)  TempSrc: Oral  Oral Oral  Resp: 21 16 16 12   Height:    4\' 10"  (1.473 m)  Weight:    58.1 kg (128 lb 1.4 oz)  SpO2: 94% 93% 95% 95%    Wt Readings from Schmitt 3 Encounters:  05/20/15 58.1 kg (128 lb 1.4 oz)  05/11/15 62.869 kg (138 lb 9.6 oz)  04/30/15 63.957 kg (141 lb)    General:  Appears calm and comfortable. Looks chronically ill. Eyes: PERRL, normal lids, irises & conjunctiva ENT: grossly normal hearing, lips & tongue. Oral mucosa is mildly dry. Neck: no LAD, masses or thyromegaly Cardiovascular: RRR, no m/r/g. No LE edema. Telemetry: SR, no arrhythmias  Respiratory: CTA bilaterally, no w/r/r. Normal respiratory effort. Abdomen: soft, Mild epigastric discomfort, no guarding, no rebound. Skin: no rash or induration seen on limited exam Musculoskeletal: grossly normal tone BUE/BLE Psychiatric: grossly  normal mood and affect, speech fluent and appropriate Neurologic: Awake, alert, oriented 4, grossly non-focal.          Labs on Admission:  Basic Metabolic Panel:  Recent Labs Lab 05/15/15 0858 05/20/15 1624  NA 140 136  K 3.0* 3.7  CL  --  96*  CO2 31* 30  GLUCOSE 104 132*  BUN 13.2 20  CREATININE 0.7 0.60  CALCIUM 8.5 8.7*   Liver Function Tests:  Recent Labs Lab 05/15/15 0858 05/20/15 1624  AST 18 51*  ALT <9 29  ALKPHOS 102 92  BILITOT 0.51 0.7  PROT 6.4 6.3*  ALBUMIN 2.2* 2.8*    Recent Labs Lab 05/20/15 1624  LIPASE 26   CBC:  Recent Labs Lab 05/15/15 0858 05/20/15 1624  WBC 13.1* 7.3  NEUTROABS 10.1* 6.8  HGB 11.2* 10.5*  HCT 36.8 33.8*  MCV 83.8 82.6  PLT 346 292    BNP (Schmitt 3 results)  Recent Labs  05/04/15 0500  BNP 861.0*    Radiological Exams on Admission: Dg Abd Acute W/chest  05/20/2015  CLINICAL DATA:  She c/o n/v--had chemo this past Tues. She is awake, alert and in no distress.Hx of pancreatic cancer. EXAM: DG ABDOMEN ACUTE W/ 1V CHEST COMPARISON:  05/03/2015 FINDINGS: Normal bowel gas pattern. No free air. No evidence of renal or ureteral stones. Changes from cardiac surgery are stable. Cardiac silhouette is normal in size and configuration. There is a moderate hiatal hernia. No mediastinal or hilar masses. Small area of focal thickening noted along the minor fissure, stable. Mild linear atelectasis at the left lung base. IMPRESSION: 1. No acute findings in the abdomen pelvis. No evidence of bowel obstruction or free air. 2. No acute cardiopulmonary disease. Electronically Signed   By: Lajean Manes M.D.   On: 05/20/2015 16:03    Assessment/Plan Principal Problem:   Intractable nausea and vomiting Admit to MedSurg/observation. Gentle IV hydration. Continue antiemetics. Protonix 40 mg IVP every 24 hours.  Active Problems:   CAD (coronary artery disease) Stable. No chest pain. Resume current medications once tolerating by  mouth.    Malignant neoplasm of pancreas [facility]) Continue treatment per oncology.  Anemia of chronic disease Monitor H&H.    CHF (congestive heart failure) (HCC) Stable and compensated. Resume current medications once tolerating oral intake.. Gentle and time limited IV hydration.  Code Status: DO NOT RESUSCITATE. DVT Prophylaxis: Lovenox. Family Communication:  Disposition Plan: Admit for gentle IV hydration and symptoms management.  Time spent: About 70 minutes were used in the process of this admission.  Reubin Milan, M.D. [facility] Pager [phone].

## 2015-05-21 DIAGNOSIS — Z951 Presence of aortocoronary bypass graft: Secondary | ICD-10-CM | POA: Diagnosis not present

## 2015-05-21 DIAGNOSIS — E43 Unspecified severe protein-calorie malnutrition: Secondary | ICD-10-CM | POA: Diagnosis present

## 2015-05-21 DIAGNOSIS — Z806 Family history of leukemia: Secondary | ICD-10-CM | POA: Diagnosis not present

## 2015-05-21 DIAGNOSIS — E876 Hypokalemia: Secondary | ICD-10-CM | POA: Diagnosis present

## 2015-05-21 DIAGNOSIS — E785 Hyperlipidemia, unspecified: Secondary | ICD-10-CM | POA: Diagnosis present

## 2015-05-21 DIAGNOSIS — J209 Acute bronchitis, unspecified: Secondary | ICD-10-CM | POA: Diagnosis present

## 2015-05-21 DIAGNOSIS — Z9221 Personal history of antineoplastic chemotherapy: Secondary | ICD-10-CM | POA: Diagnosis not present

## 2015-05-21 DIAGNOSIS — Z79899 Other long term (current) drug therapy: Secondary | ICD-10-CM | POA: Diagnosis not present

## 2015-05-21 DIAGNOSIS — C251 Malignant neoplasm of body of pancreas: Secondary | ICD-10-CM | POA: Diagnosis not present

## 2015-05-21 DIAGNOSIS — C257 Malignant neoplasm of other parts of pancreas: Secondary | ICD-10-CM | POA: Diagnosis not present

## 2015-05-21 DIAGNOSIS — Z7952 Long term (current) use of systemic steroids: Secondary | ICD-10-CM | POA: Diagnosis not present

## 2015-05-21 DIAGNOSIS — C786 Secondary malignant neoplasm of retroperitoneum and peritoneum: Secondary | ICD-10-CM | POA: Diagnosis present

## 2015-05-21 DIAGNOSIS — Z8249 Family history of ischemic heart disease and other diseases of the circulatory system: Secondary | ICD-10-CM | POA: Diagnosis not present

## 2015-05-21 DIAGNOSIS — I25119 Atherosclerotic heart disease of native coronary artery with unspecified angina pectoris: Secondary | ICD-10-CM | POA: Diagnosis not present

## 2015-05-21 DIAGNOSIS — I2510 Atherosclerotic heart disease of native coronary artery without angina pectoris: Secondary | ICD-10-CM | POA: Diagnosis present

## 2015-05-21 DIAGNOSIS — Z87891 Personal history of nicotine dependence: Secondary | ICD-10-CM | POA: Diagnosis not present

## 2015-05-21 DIAGNOSIS — G893 Neoplasm related pain (acute) (chronic): Secondary | ICD-10-CM | POA: Diagnosis present

## 2015-05-21 DIAGNOSIS — Z6826 Body mass index (BMI) 26.0-26.9, adult: Secondary | ICD-10-CM | POA: Diagnosis not present

## 2015-05-21 DIAGNOSIS — K5903 Drug induced constipation: Secondary | ICD-10-CM | POA: Diagnosis present

## 2015-05-21 DIAGNOSIS — I5022 Chronic systolic (congestive) heart failure: Secondary | ICD-10-CM | POA: Diagnosis present

## 2015-05-21 DIAGNOSIS — D638 Anemia in other chronic diseases classified elsewhere: Secondary | ICD-10-CM | POA: Diagnosis present

## 2015-05-21 DIAGNOSIS — T451X5A Adverse effect of antineoplastic and immunosuppressive drugs, initial encounter: Secondary | ICD-10-CM | POA: Diagnosis present

## 2015-05-21 DIAGNOSIS — Z885 Allergy status to narcotic agent status: Secondary | ICD-10-CM | POA: Diagnosis not present

## 2015-05-21 DIAGNOSIS — C259 Malignant neoplasm of pancreas, unspecified: Secondary | ICD-10-CM | POA: Diagnosis present

## 2015-05-21 DIAGNOSIS — Z803 Family history of malignant neoplasm of breast: Secondary | ICD-10-CM | POA: Diagnosis not present

## 2015-05-21 DIAGNOSIS — I5021 Acute systolic (congestive) heart failure: Secondary | ICD-10-CM | POA: Diagnosis not present

## 2015-05-21 DIAGNOSIS — R197 Diarrhea, unspecified: Secondary | ICD-10-CM | POA: Diagnosis present

## 2015-05-21 DIAGNOSIS — Z881 Allergy status to other antibiotic agents status: Secondary | ICD-10-CM | POA: Diagnosis not present

## 2015-05-21 DIAGNOSIS — R112 Nausea with vomiting, unspecified: Secondary | ICD-10-CM | POA: Diagnosis present

## 2015-05-21 DIAGNOSIS — Z7982 Long term (current) use of aspirin: Secondary | ICD-10-CM | POA: Diagnosis not present

## 2015-05-21 DIAGNOSIS — Z66 Do not resuscitate: Secondary | ICD-10-CM | POA: Diagnosis present

## 2015-05-21 DIAGNOSIS — Z800 Family history of malignant neoplasm of digestive organs: Secondary | ICD-10-CM | POA: Diagnosis not present

## 2015-05-21 DIAGNOSIS — Z888 Allergy status to other drugs, medicaments and biological substances status: Secondary | ICD-10-CM | POA: Diagnosis not present

## 2015-05-21 DIAGNOSIS — T40605A Adverse effect of unspecified narcotics, initial encounter: Secondary | ICD-10-CM | POA: Diagnosis present

## 2015-05-21 DIAGNOSIS — R1110 Vomiting, unspecified: Secondary | ICD-10-CM | POA: Diagnosis not present

## 2015-05-21 DIAGNOSIS — C7800 Secondary malignant neoplasm of unspecified lung: Secondary | ICD-10-CM | POA: Diagnosis present

## 2015-05-21 DIAGNOSIS — I255 Ischemic cardiomyopathy: Secondary | ICD-10-CM | POA: Diagnosis present

## 2015-05-21 LAB — COMPREHENSIVE METABOLIC PANEL
ALK PHOS: 73 U/L (ref 38–126)
ALT: 23 U/L (ref 14–54)
AST: 39 U/L (ref 15–41)
Albumin: 2.5 g/dL — ABNORMAL LOW (ref 3.5–5.0)
Anion gap: 9 (ref 5–15)
BUN: 18 mg/dL (ref 6–20)
CALCIUM: 8.3 mg/dL — AB (ref 8.9–10.3)
CO2: 29 mmol/L (ref 22–32)
CREATININE: 0.54 mg/dL (ref 0.44–1.00)
Chloride: 102 mmol/L (ref 101–111)
Glucose, Bld: 94 mg/dL (ref 65–99)
Potassium: 3.3 mmol/L — ABNORMAL LOW (ref 3.5–5.1)
Sodium: 140 mmol/L (ref 135–145)
Total Bilirubin: 0.7 mg/dL (ref 0.3–1.2)
Total Protein: 5.3 g/dL — ABNORMAL LOW (ref 6.5–8.1)

## 2015-05-21 LAB — CBC WITH DIFFERENTIAL/PLATELET
Basophils Absolute: 0 10*3/uL (ref 0.0–0.1)
Basophils Relative: 0 %
EOS PCT: 0 %
Eosinophils Absolute: 0 10*3/uL (ref 0.0–0.7)
HCT: 31.5 % — ABNORMAL LOW (ref 36.0–46.0)
HEMOGLOBIN: 9.8 g/dL — AB (ref 12.0–15.0)
Lymphocytes Relative: 24 %
Lymphs Abs: 1.4 10*3/uL (ref 0.7–4.0)
MCH: 25.7 pg — AB (ref 26.0–34.0)
MCHC: 31.1 g/dL (ref 30.0–36.0)
MCV: 82.7 fL (ref 78.0–100.0)
Monocytes Absolute: 0.3 10*3/uL (ref 0.1–1.0)
Monocytes Relative: 5 %
NEUTROS PCT: 71 %
Neutro Abs: 4.1 10*3/uL (ref 1.7–7.7)
PLATELETS: 230 10*3/uL (ref 150–400)
RBC: 3.81 MIL/uL — AB (ref 3.87–5.11)
RDW: 17.7 % — ABNORMAL HIGH (ref 11.5–15.5)
WBC: 5.8 10*3/uL (ref 4.0–10.5)

## 2015-05-21 MED ORDER — ALBUTEROL SULFATE (2.5 MG/3ML) 0.083% IN NEBU: 2.5000 mg | INHALATION_SOLUTION | RESPIRATORY_TRACT | Status: DC | PRN

## 2015-05-21 MED ORDER — MOMETASONE FURO-FORMOTEROL FUM 200-5 MCG/ACT IN AERO
2.0000 | INHALATION_SPRAY | Freq: Two times a day (BID) | RESPIRATORY_TRACT | Status: DC
  Administered 2015-05-21 – 2015-05-26 (×10): 2 via RESPIRATORY_TRACT
  Filled 2015-05-21: qty 8.8

## 2015-05-21 MED ORDER — ASPIRIN EC 81 MG PO TBEC
81.0000 mg | DELAYED_RELEASE_TABLET | Freq: Every day | ORAL | Status: DC
  Administered 2015-05-21 – 2015-05-26 (×6): 81 mg via ORAL
  Filled 2015-05-21 (×6): qty 1

## 2015-05-21 MED ORDER — CARVEDILOL 3.125 MG PO TABS
3.1250 mg | ORAL_TABLET | Freq: Two times a day (BID) | ORAL | Status: DC
  Administered 2015-05-21 – 2015-05-23 (×5): 3.125 mg via ORAL
  Filled 2015-05-21 (×8): qty 1

## 2015-05-21 MED ORDER — SODIUM CHLORIDE 0.9% FLUSH
10.0000 mL | INTRAVENOUS | Status: DC | PRN
  Administered 2015-05-23 – 2015-05-24 (×2): 10 mL
  Filled 2015-05-21 (×2): qty 40

## 2015-05-21 MED ORDER — POTASSIUM CHLORIDE CRYS ER 20 MEQ PO TBCR
40.0000 meq | EXTENDED_RELEASE_TABLET | Freq: Once | ORAL | Status: AC
  Administered 2015-05-21: 40 meq via ORAL
  Filled 2015-05-21: qty 2

## 2015-05-21 MED ORDER — PANCRELIPASE (LIP-PROT-AMYL) 12000-38000 UNITS PO CPEP
24000.0000 [IU] | ORAL_CAPSULE | Freq: Three times a day (TID) | ORAL | Status: DC
  Administered 2015-05-21 – 2015-05-26 (×15): 24000 [IU] via ORAL
  Filled 2015-05-21 (×18): qty 2

## 2015-05-21 MED ORDER — ALBUTEROL SULFATE HFA 108 (90 BASE) MCG/ACT IN AERS: 1.0000 | INHALATION_SPRAY | RESPIRATORY_TRACT | Status: DC | PRN

## 2015-05-21 NOTE — Progress Notes (Signed)
Pt arrived to unit room 1507 via stretcher w/ brother at bedside. VS taken pt oriented to room and callbell with no complications. Gait unsteady, general weakness. No pain. Initial assessment completed. Pt guide at the bedside. Will continue to monitor pt

## 2015-05-21 NOTE — Progress Notes (Signed)
PROGRESS NOTE  Tammy Schmitt [id number] DOB: Dec 07, 1940 DOA: 05/20/2015 PCP: [name], NP  Assessment/Plan: nausea and vomiting/diarrhea Gentle IV hydration. Continue antiemetics. Protonix 40 mg IV every 24 hours -clears -GI pathogen panel   CAD (coronary artery disease) Stable. No chest pain. Resume current medications once tolerating by mouth.   Malignant neoplasm of pancreas [facility]) Continue treatment per oncology.   Anemia of chronic disease Monitor H&H.   CHF (congestive heart failure) (HCC) Stable and compensated. Resume current medications once tolerating oral intake.. Gentle and time limited IV hydration.  Hypokalemia -replete  Code Status: DNR Family Communication: patient Disposition Plan:    Consultants:    Procedures:      HPI/Subjective: Still nausea  Objective: Filed Vitals:   05/20/15 2134 05/21/15 0516  BP: 152/62 150/84  Pulse: 99 95  Temp: 98.4 F (36.9 C) 97.8 F (36.6 C)  Resp: 12 12   No intake or output data in the 24 hours ending 05/21/15 1149 Filed Weights   05/20/15 2134  Weight: 58.1 kg (128 lb 1.4 oz)    Exam:   General:  Ill appearing  Cardiovascular: rrr  Respiratory: clear  Abdomen: +BS, tender to palpation  Musculoskeletal: no edema   Data Reviewed: Basic Metabolic Panel:  Recent Labs Lab 05/15/15 0858 05/20/15 1624 05/21/15 0830  NA 140 136 140  K 3.0* 3.7 3.3*  CL  --  96* 102  CO2 31* 30 29  GLUCOSE 104 132* 94  BUN 13.2 20 18   CREATININE 0.7 0.60 0.54  CALCIUM 8.5 8.7* 8.3*   Liver Function Tests:  Recent Labs Lab 05/15/15 0858 05/20/15 1624 05/21/15 0830  AST 18 51* 39  ALT 9 29 23   ALKPHOS 102 92 73  BILITOT 0.51 0.7 0.7  PROT 6.4 6.3* 5.3*  ALBUMIN 2.2* 2.8* 2.5*    Recent Labs Lab 05/20/15 1624  LIPASE 26   No results for input(s): AMMONIA in the last 168 hours. CBC:  Recent Labs Lab 05/15/15 0858 05/20/15 1624 05/21/15 0830  WBC 13.1* 7.3  5.8  NEUTROABS 10.1* 6.8 4.1  HGB 11.2* 10.5* 9.8*  HCT 36.8 33.8* 31.5*  MCV 83.8 82.6 82.7  PLT 346 292 230   Cardiac Enzymes: No results for input(s): CKTOTAL, CKMB, CKMBINDEX, TROPONINI in the last 168 hours. BNP (last 3 results)  Recent Labs  05/04/15 0500  BNP 861.0*    ProBNP (last 3 results) No results for input(s): PROBNP in the last 8760 hours.  CBG: No results for input(s): GLUCAP in the last 168 hours.  No results found for this or any previous visit (from the past 240 hour(s)).   Studies: Dg Abd Acute W/chest  05/20/2015  CLINICAL DATA:  She c/o n/v--had chemo this past Tues. She is awake, alert and in no distress.Hx of pancreatic cancer. EXAM: DG ABDOMEN ACUTE W/ 1V CHEST COMPARISON:  05/03/2015 FINDINGS: Normal bowel gas pattern. No free air. No evidence of renal or ureteral stones. Changes from cardiac surgery are stable. Cardiac silhouette is normal in size and configuration. There is a moderate hiatal hernia. No mediastinal or hilar masses. Small area of focal thickening noted along the minor fissure, stable. Mild linear atelectasis at the left lung base. IMPRESSION: 1. No acute findings in the abdomen pelvis. No evidence of bowel obstruction or free air. 2. No acute cardiopulmonary disease. Electronically Signed   By: Lajean Manes M.D.   On: 05/20/2015 16:03    Scheduled Meds: . aspirin EC  81 mg Oral  Daily  . carvedilol  3.125 mg Oral BID WC  . [START ON 05/23/2015] fentaNYL  12.5 mcg Transdermal Q72H  . [START ON 05/23/2015] fentaNYL  25 mcg Transdermal Q72H  . mometasone-formoterol  2 puff Inhalation BID  . Pancrelipase (Lip-Prot-Amyl)  1 capsule Oral TID WC  . pantoprazole (PROTONIX) IV  40 mg Intravenous Q24H   Continuous Infusions: . 0.9 % NaCl with KCl 20 mEq / L     Antibiotics Given (last 72 hours)    None      Principal Problem:   Intractable nausea and vomiting Active Problems:   CAD (coronary artery disease)   Malignant neoplasm of  pancreas (HCC)   Anemia of chronic disease   CHF (congestive heart failure) ([place])    Time spent: 25 min    [facility] Pager [phone]. If 7PM-7AM, please contact night-coverage at [email or web address], password [facility] 05/21/2015, 11:49 AM

## 2015-05-21 NOTE — Progress Notes (Signed)
Initial Nutrition Assessment  DOCUMENTATION CODES:   Severe malnutrition in context of acute illness/injury  INTERVENTION:  - Diet advancement as medically feasible - RD will attempt to obtain Ensure Compact from [place] [address] for when diet advanced - RD will continue to monitor for additional needs  NUTRITION DIAGNOSIS:   Inadequate protein intake related to other (see comment) (current diet order) as evidenced by other (see comment) (CLD does not meet estimated protein needs.)  GOAL:   Patient will meet greater than or equal to 90% of their needs  MONITOR:   Diet advancement, Weight trends, Labs, I & O's  REASON FOR ASSESSMENT:   Malnutrition Screening Tool  ASSESSMENT:   75 y.o. female with a past medical history of pancreatic cancer, CAD, systolic CHF, hyperlipidemia who comes to the emergency department with complaints of nausea and emesis multiple times a day after she had chemotherapy this past Tuesday. She denies fever, chills, diarrhea, constipation, hematochezia or melena.  Pt seen for MST. BMi indicates overweight status. No intakes documented since admission as pt was NPO until diet advancement prior to lunch. Pt currently on CLD and pt states she ordered chicken broth and iced tea for lunch. Pt recently d/c'ed and was being followed by RD/dietetic intern earlier this month. Pt states that appetite continues to be poor. She states favorite foods used to include cake, bread, and pasta but she now lacks the desire to eat even these favorite foods. Pt denies abdominal pain or nausea/vomiting today. Pt states that PTA she was experiencing these symptoms for several days and that 3/25 she ate 1.25 Saltine crackers which she kept down but no other solid foods since then. Pt states she continues with poor desire to eat but feels this is a result of advanced cancer and she is not concerned about poor appetite and intakes. Pt states she was drinking Ensure Compact PTA when she  was unable to find anything else to eat but does not consume this item consistently.   Pt refuses all other nutrition supplements. Call placed to [facility] [address] to request a few bottles of this supplement to provide pt for when diet is advanced. Physical assessment shows mild fat and moderate muscle wasting. Pt reports stable weight recently but chart review indicates 10 lb weight loss (7% wt loss) in the past 9 days which is significant for time frame.   Not meeting needs. Medications reviewed; 24000 units Creon TID with meals, PRN IV Zofran, 40 mg IV Protonix. IVF: NS-20 mEq KCl @ 50 mL/hr. Labs reviewed; Cl: 96 mmol/L, Ca: 8.7 mg/dL.   Diet Order:  Diet clear liquid Room service appropriate?: Yes; Fluid consistency:: Thin  Skin:  Reviewed, no issues  Last BM:  3/27  Height:   Ht Readings from Last 1 Encounters:  05/20/15 4\' 10"  (1.473 m)    Weight:   Wt Readings from Last 1 Encounters:  05/20/15 128 lb 1.4 oz (58.1 kg)    Ideal Body Weight:  42.18 kg (kg)  BMI:  Body mass index is 26.78 kg/(m^2).  Estimated Nutritional Needs:   Kcal:  1750-1920 (30-33 kcal/kg)  Protein:  81-93 grams (1.4-1.6 grams/kg)  Fluid:  >/= 1.8 L/day  EDUCATION NEEDS:   No education needs identified at this time     Jarome Matin, RD, LDN Inpatient Clinical Dietitian Pager # [phone] After hours/weekend pager # [phone]

## 2015-05-21 NOTE — Care Management Obs Status (Signed)
[place] NOTIFICATION   Patient Details  Name: Tammy Schmitt MRN: [id number] Date of Birth: [birth year]   Medicare Observation Status Notification Given:  Yes    Leeroy Cha, RN 05/21/2015, 11:28 AM

## 2015-05-22 ENCOUNTER — Ambulatory Visit: Payer: Medicare HMO | Admitting: Hematology

## 2015-05-22 LAB — BASIC METABOLIC PANEL
ANION GAP: 9 (ref 5–15)
BUN: 13 mg/dL (ref 6–20)
CO2: 27 mmol/L (ref 22–32)
Calcium: 8.2 mg/dL — ABNORMAL LOW (ref 8.9–10.3)
Chloride: 104 mmol/L (ref 101–111)
Creatinine, Ser: 0.39 mg/dL — ABNORMAL LOW (ref 0.44–1.00)
GFR calc Af Amer: >60 mL/min (ref >60)
GLUCOSE: 90 mg/dL (ref 65–99)
POTASSIUM: 3.5 mmol/L (ref 3.5–5.1)
Sodium: 140 mmol/L (ref 135–145)

## 2015-05-22 LAB — C DIFFICILE QUICK SCREEN W PCR REFLEX
C DIFFICILE (CDIFF) INTERP: NEGATIVE
C Diff antigen: NEGATIVE
C Diff toxin: NEGATIVE

## 2015-05-22 LAB — CBC
HEMATOCRIT: 30.1 % — AB (ref 36.0–46.0)
Hemoglobin: 9.4 g/dL — ABNORMAL LOW (ref 12.0–15.0)
MCH: 26.2 pg (ref 26.0–34.0)
MCHC: 31.2 g/dL (ref 30.0–36.0)
MCV: 83.8 fL (ref 78.0–100.0)
PLATELETS: 167 10*3/uL (ref 150–400)
RBC: 3.59 MIL/uL — AB (ref 3.87–5.11)
RDW: 17.8 % — ABNORMAL HIGH (ref 11.5–15.5)
WBC: 3.8 10*3/uL — AB (ref 4.0–10.5)

## 2015-05-22 LAB — GASTROINTESTINAL PANEL BY PCR, STOOL (REPLACES STOOL CULTURE)
Adenovirus F40/41: NOT DETECTED
Astrovirus: NOT DETECTED
CRYPTOSPORIDIUM: NOT DETECTED
Campylobacter species: NOT DETECTED
Cyclospora cayetanensis: NOT DETECTED
E. COLI O157: NOT DETECTED
ENTAMOEBA HISTOLYTICA: NOT DETECTED
ENTEROAGGREGATIVE E COLI (EAEC): NOT DETECTED
Enteropathogenic E coli (EPEC): NOT DETECTED
Enterotoxigenic E coli (ETEC): NOT DETECTED
GIARDIA LAMBLIA: NOT DETECTED
NOROVIRUS GI/GII: NOT DETECTED
Plesimonas shigelloides: NOT DETECTED
Rotavirus A: NOT DETECTED
SALMONELLA SPECIES: NOT DETECTED
SAPOVIRUS (I, II, IV, AND V): NOT DETECTED
SHIGELLA/ENTEROINVASIVE E COLI (EIEC): NOT DETECTED
Shiga like toxin producing E coli (STEC): NOT DETECTED
VIBRIO CHOLERAE: NOT DETECTED
Vibrio species: NOT DETECTED
YERSINIA ENTEROCOLITICA: NOT DETECTED

## 2015-05-22 MED ORDER — METRONIDAZOLE IN NACL 5-0.79 MG/ML-% IV SOLN
500.0000 mg | Freq: Three times a day (TID) | INTRAVENOUS | Status: DC
  Administered 2015-05-22 (×2): 500 mg via INTRAVENOUS
  Filled 2015-05-22 (×3): qty 100

## 2015-05-22 MED ORDER — SACCHAROMYCES BOULARDII 250 MG PO CAPS
250.0000 mg | ORAL_CAPSULE | Freq: Two times a day (BID) | ORAL | Status: DC
  Administered 2015-05-22 – 2015-05-26 (×8): 250 mg via ORAL
  Filled 2015-05-22 (×9): qty 1

## 2015-05-22 MED ORDER — DIPHENOXYLATE-ATROPINE 2.5-0.025 MG PO TABS
2.0000 | ORAL_TABLET | ORAL | Status: AC
  Administered 2015-05-22: 2 via ORAL
  Filled 2015-05-22: qty 2

## 2015-05-22 MED ORDER — POTASSIUM CHLORIDE IN NACL 20-0.9 MEQ/L-% IV SOLN
INTRAVENOUS | Status: AC
  Administered 2015-05-22: 22:00:00 via INTRAVENOUS
  Filled 2015-05-22: qty 1000

## 2015-05-22 MED ORDER — DIPHENOXYLATE-ATROPINE 2.5-0.025 MG PO TABS
1.0000 | ORAL_TABLET | Freq: Four times a day (QID) | ORAL | Status: DC | PRN
  Administered 2015-05-23 – 2015-05-24 (×2): 1 via ORAL
  Filled 2015-05-22 (×2): qty 1

## 2015-05-22 NOTE — Progress Notes (Signed)
Chaplain paged to assist Ms Tappen with Advanced Directive Preparation.  Ms Reihl states that she has waited "several days" to complete a HCPOA. She is anxious to get "her affairs in order," including discussions about a Last Will and Testament, Funeral Preparation, and any other documents the hospital can assist with at this time. She seems to feel her death is close at hand and her anxiety over not receiving such help is heightened as time goes by. Chaplain discussed with Ms Malczewski what questions will be need to be answered for a completed HCPOA. She seemed competent to deal with these issues and stated she understood each question. However, she asked for the document to be left with her and a chaplain or social worker to come early on March 29 (tomorrow as this is being written) to complete the process. No promises were made as to when or if that would take place. I promise was made to alert daytime chaplains of this desire.  Ms Koffler is clearly preparing for her death. Recommend a chaplain follow-up to assist with the HCPOA and just as importantly assist with any other EOL issues.  Sallee Lange. Altonio Schwertner, DMin, MDiv Chaplain

## 2015-05-22 NOTE — Progress Notes (Signed)
PROGRESS NOTE  Tammy Schmitt [id number] DOB: 12-15-1940 DOA: 05/20/2015 PCP: [name], NP   Tammy Schmitt is a 75 y.o. female with a past medical history of pancreatic cancer, CAD, systolic CHF, hyperlipidemia who comes to the emergency department with complaints of nausea, emesis and diarrhea multiple times a day after she had chemotherapy this past Tuesday.  Assessment/Plan: nausea and vomiting/diarrhea Gentle IV hydration. Continue antiemetics. Protonix 40 mg IV every 24 hours -advance diet -GI pathogen panel -add flagyl until back   CAD (coronary artery disease) Stable. No chest pain. Resume current medications once tolerating by mouth.   Malignant neoplasm of pancreas [facility]) Continue treatment per oncology.   Anemia of chronic disease Monitor H&H.   CHF (congestive heart failure) (HCC) Stable and compensated. Resume current medications once tolerating oral intake.. Gentle and time limited IV hydration.  Hypokalemia -replete  Code Status: DNR Family Communication: patient Disposition Plan:    Consultants:    Procedures:      HPI/Subjective: Would like to try to advance diet Still with diarrhea   Objective: Filed Vitals:   05/21/15 2223 05/22/15 0447  BP: 109/56 122/59  Pulse: 76 86  Temp: 97.8 F (36.6 C) 98.3 F (36.8 C)  Resp: 16 16    Intake/Output Summary (Last 24 hours) at 05/22/15 1223 Last data filed at 05/22/15 0800  Gross per 24 hour  Intake    480 ml  Output      0 ml  Net    480 ml   Filed Weights   05/20/15 2134  Weight: 58.1 kg (128 lb 1.4 oz)    Exam:   General:  Ill appearing  Cardiovascular: rrr  Respiratory: clear  Abdomen: +BS, tender to palpation  Musculoskeletal: no edema   Data Reviewed: Basic Metabolic Panel:  Recent Labs Lab 05/20/15 1624 05/21/15 0830 05/22/15 0500  NA 136 140 140  K 3.7 3.3* 3.5  CL 96* 102 104  CO2 30 29 27   GLUCOSE 132* 94 90  BUN 20 18 13     CREATININE 0.60 0.54 0.39*  CALCIUM 8.7* 8.3* 8.2*   Liver Function Tests:  Recent Labs Lab 05/20/15 1624 05/21/15 0830  AST 51* 39  ALT 29 23  ALKPHOS 92 73  BILITOT 0.7 0.7  PROT 6.3* 5.3*  ALBUMIN 2.8* 2.5*    Recent Labs Lab 05/20/15 1624  LIPASE 26   No results for input(s): AMMONIA in the last 168 hours. CBC:  Recent Labs Lab 05/20/15 1624 05/21/15 0830 05/22/15 0500  WBC 7.3 5.8 3.8*  NEUTROABS 6.8 4.1  --   HGB 10.5* 9.8* 9.4*  HCT 33.8* 31.5* 30.1*  MCV 82.6 82.7 83.8  PLT 292 230 167   Cardiac Enzymes: No results for input(s): CKTOTAL, CKMB, CKMBINDEX, TROPONINI in the last 168 hours. BNP (last 3 results)  Recent Labs  05/04/15 0500  BNP 861.0*    ProBNP (last 3 results) No results for input(s): PROBNP in the last 8760 hours.  CBG: No results for input(s): GLUCAP in the last 168 hours.  No results found for this or any previous visit (from the past 240 hour(s)).   Studies: Dg Abd Acute W/chest  05/20/2015  CLINICAL DATA:  She c/o n/v--had chemo this past Tues. She is awake, alert and in no distress.Hx of pancreatic cancer. EXAM: DG ABDOMEN ACUTE W/ 1V CHEST COMPARISON:  05/03/2015 FINDINGS: Normal bowel gas pattern. No free air. No evidence of renal or ureteral stones. Changes from cardiac surgery are stable.  Cardiac silhouette is normal in size and configuration. There is a moderate hiatal hernia. No mediastinal or hilar masses. Small area of focal thickening noted along the minor fissure, stable. Mild linear atelectasis at the left lung base. IMPRESSION: 1. No acute findings in the abdomen pelvis. No evidence of bowel obstruction or free air. 2. No acute cardiopulmonary disease. Electronically Signed   By: Lajean Manes M.D.   On: 05/20/2015 16:03    Scheduled Meds: . aspirin EC  81 mg Oral Daily  . carvedilol  3.125 mg Oral BID WC  . [START ON 05/23/2015] fentaNYL  12.5 mcg Transdermal Q72H  . [START ON 05/23/2015] fentaNYL  25 mcg  Transdermal Q72H  . lipase/protease/amylase  24,000 Units Oral TID WC  . metronidazole  500 mg Intravenous Q8H  . mometasone-formoterol  2 puff Inhalation BID  . pantoprazole (PROTONIX) IV  40 mg Intravenous Q24H   Continuous Infusions:   Antibiotics Given (last 72 hours)    Date/Time Action Medication Dose Rate   05/22/15 1020 Given   metroNIDAZOLE (FLAGYL) IVPB 500 mg 500 mg 100 mL/hr      Principal Problem:   Intractable nausea and vomiting Active Problems:   CAD (coronary artery disease)   Malignant neoplasm of pancreas (HCC)   Anemia of chronic disease   CHF (congestive heart failure) ([place])    Time spent: 25 min    [facility] Pager [phone]. If 7PM-7AM, please contact night-coverage at [email or web address], password [facility] 05/22/2015, 12:23 PM  LOS: 1 day

## 2015-05-22 NOTE — Clinical Documentation Improvement (Signed)
Internal Medicine  Do you agree with the Registered Dietician's 05/21/15 assessment of "Severe malnutrition in context of acute illness/injury"?   Document Severity - Severe(third degree), Moderate (second degree), Mild (first degree)  Other condition  Unable to clinically determine  Supporting Information:  BMI= 26.78   Please exercise your independent, professional judgment when responding. A specific answer is not anticipated or expected.   Thank You, Rolm Gala, RN, [place] [phone]

## 2015-05-23 DIAGNOSIS — E43 Unspecified severe protein-calorie malnutrition: Secondary | ICD-10-CM

## 2015-05-23 DIAGNOSIS — I25119 Atherosclerotic heart disease of native coronary artery with unspecified angina pectoris: Secondary | ICD-10-CM

## 2015-05-23 DIAGNOSIS — I5021 Acute systolic (congestive) heart failure: Secondary | ICD-10-CM

## 2015-05-23 DIAGNOSIS — C251 Malignant neoplasm of body of pancreas: Secondary | ICD-10-CM

## 2015-05-23 DIAGNOSIS — D638 Anemia in other chronic diseases classified elsewhere: Secondary | ICD-10-CM

## 2015-05-23 LAB — CBC
HCT: 29.1 % — ABNORMAL LOW (ref 36.0–46.0)
HEMOGLOBIN: 9.1 g/dL — AB (ref 12.0–15.0)
MCH: 25.5 pg — AB (ref 26.0–34.0)
MCHC: 31.3 g/dL (ref 30.0–36.0)
MCV: 81.5 fL (ref 78.0–100.0)
Platelets: 128 10*3/uL — ABNORMAL LOW (ref 150–400)
RBC: 3.57 MIL/uL — AB (ref 3.87–5.11)
RDW: 17.6 % — ABNORMAL HIGH (ref 11.5–15.5)
WBC: 5.2 10*3/uL (ref 4.0–10.5)

## 2015-05-23 LAB — BASIC METABOLIC PANEL
ANION GAP: 6 (ref 5–15)
BUN: 9 mg/dL (ref 6–20)
CHLORIDE: 105 mmol/L (ref 101–111)
CO2: 27 mmol/L (ref 22–32)
CREATININE: 0.6 mg/dL (ref 0.44–1.00)
Calcium: 8.1 mg/dL — ABNORMAL LOW (ref 8.9–10.3)
GFR calc non Af Amer: >60 mL/min (ref >60)
Glucose, Bld: 98 mg/dL (ref 65–99)
POTASSIUM: 3.4 mmol/L — AB (ref 3.5–5.1)
SODIUM: 138 mmol/L (ref 135–145)

## 2015-05-23 LAB — MAGNESIUM: MAGNESIUM: 1.3 mg/dL — AB (ref 1.7–2.4)

## 2015-05-23 MED ORDER — POTASSIUM CHLORIDE 10 MEQ/100ML IV SOLN
10.0000 meq | INTRAVENOUS | Status: AC
  Administered 2015-05-23 (×4): 10 meq via INTRAVENOUS
  Filled 2015-05-23 (×4): qty 100

## 2015-05-23 MED ORDER — POTASSIUM CHLORIDE CRYS ER 20 MEQ PO TBCR
60.0000 meq | EXTENDED_RELEASE_TABLET | Freq: Once | ORAL | Status: DC
  Filled 2015-05-23 (×2): qty 3

## 2015-05-23 NOTE — Progress Notes (Signed)
Provided support at bedside and completed and notarized Advance Directive.   Tammy Schmitt wishes to explore donating her body to research after her death.  I will follow up with her for continued support and any information I discover about the process of body donation.    [place], [place]

## 2015-05-23 NOTE — Progress Notes (Signed)
Nutrition Follow-up  DOCUMENTATION CODES:   Severe malnutrition in context of acute illness/injury  INTERVENTION:   - Provided patient with two bottles of Ensure Compact. - Encouraged good PO intake of meals and supplements. - Will continue to monitor for nutrition needs.   NUTRITION DIAGNOSIS:   Inadequate oral intake related to poor appetite (early satiety) as evidenced by per patient/family report.  New  GOAL:   Patient will meet greater than or equal to 90% of their needs  Currently unmet  MONITOR:   PO intake, Labs, Weight trends, I & O's, Skin, Diet advancement  ASSESSMENT:   75 y.o. female with a past medical history of pancreatic cancer, CAD, systolic CHF, hyperlipidemia who comes to the emergency department with complaints of nausea and emesis multiple times a day after she had chemotherapy this past Tuesday. She denies fever, chills, diarrhea, constipation, hematochezia or melena.  Patient reports that her nausea is now under control and that she has been able to eat more of her meals.  Patient was advanced to a full liquid diet on 3/28.  States that she had juice, chicken soup, and an [place] [employer] for breakfast, per chart review 50% meal completion.  Provided patient with two Ensure Compact supplements from the [facility].  Patient was thankful for them and stated that she would consume one if she was feeling hungry this afternoon.  Patient declined offer for other snacks or supplements.   Patient is still not meeting her kcal or protein needs.  Patient stated that she had a late breakfast and had not ordered lunch yet.  Encouraged patient to order lunch so she would have a little something to eat throughout the day.  Will continue to monitor for nutrition needs.  Diet Order:  Diet full liquid Room service appropriate?: Yes; Fluid consistency:: Thin  Skin:  Reviewed, no issues  Last BM:  3/27  Height:   Ht Readings from Last 1 Encounters:  05/20/15 4\' 10"   (1.473 m)    Weight:   Wt Readings from Last 1 Encounters:  05/20/15 128 lb 1.4 oz (58.1 kg)    Ideal Body Weight:  42.18 kg (kg)  BMI:  Body mass index is 26.78 kg/(m^2).  Estimated Nutritional Needs:   Kcal:  1750-1920 (30-33 kcal/kg)  Protein:  81-93 grams (1.4-1.6 grams/kg)  Fluid:  >/= 1.8 L/day  EDUCATION NEEDS:   No education needs identified at this time  Tammy Schmitt, Dietetic Intern Pager: [phone]

## 2015-05-23 NOTE — Progress Notes (Signed)
PROGRESS NOTE  MHIA MATHRE [id number] DOB: 16-Feb-1941 DOA: 05/20/2015 PCP: [name], NP   Subjective: Would like to try to advance diet Still with diarrhea  HPI: Tammy Schmitt is a 75 y.o. female with a past medical history of pancreatic cancer, CAD, systolic CHF, hyperlipidemia who comes to the emergency department with complaints of nausea, emesis and diarrhea multiple times a day after she had chemotherapy this past Tuesday.  Assessment/Plan:  Nausea and vomiting/diarrhea This is likely secondary to recent chemotherapy, came in with nausea, vomiting and diarrhea. Treat symptomatically with antiemetics, continue IV fluids. Protonix 40 mg IV every 24 hours GI pathogen and C. difficile is negative, if needed will use Imodium or Lomotil.  CAD (coronary artery disease) Stable. No chest pain. Resume current medications once tolerating by mouth.  Malignant neoplasm of pancreas [facility]) Continue treatment per oncology.  Anemia of chronic disease Monitor H&H.  CHF (congestive heart failure) (HCC) Stable and compensated. Resume current medications once tolerating oral intake.. Gentle and time limited IV hydration.  Hypokalemia -replete  Code Status: DNR Family Communication: patient Disposition Plan:    Consultants:    Procedures:      Objective: Filed Vitals:   05/23/15 0513 05/23/15 0739  BP: 121/71   Pulse: 87 89  Temp: 98.1 F (36.7 C)   Resp: 16 16   No intake or output data in the 24 hours ending 05/23/15 1553 Filed Weights   05/20/15 2134  Weight: 58.1 kg (128 lb 1.4 oz)    Exam:   General:  Ill appearing  Cardiovascular: rrr  Respiratory: clear  Abdomen: +BS, tender to palpation  Musculoskeletal: no edema   Data Reviewed: Basic Metabolic Panel:  Recent Labs Lab 05/20/15 1624 05/21/15 0830 05/22/15 0500 05/23/15 0509  NA 136 140 140 138  K 3.7 3.3* 3.5 3.4*  CL 96* 102 104 105  CO2 30 29 27 27   GLUCOSE  132* 94 90 98  BUN 20 18 13 9   CREATININE 0.60 0.54 0.39* 0.60  CALCIUM 8.7* 8.3* 8.2* 8.1*  MG  --   --   --  1.3*   Liver Function Tests:  Recent Labs Lab 05/20/15 1624 05/21/15 0830  AST 51* 39  ALT 29 23  ALKPHOS 92 73  BILITOT 0.7 0.7  PROT 6.3* 5.3*  ALBUMIN 2.8* 2.5*    Recent Labs Lab 05/20/15 1624  LIPASE 26   No results for input(s): AMMONIA in the last 168 hours. CBC:  Recent Labs Lab 05/20/15 1624 05/21/15 0830 05/22/15 0500 05/23/15 0509  WBC 7.3 5.8 3.8* 5.2  NEUTROABS 6.8 4.1  --   --   HGB 10.5* 9.8* 9.4* 9.1*  HCT 33.8* 31.5* 30.1* 29.1*  MCV 82.6 82.7 83.8 81.5  PLT 292 230 167 128*   Cardiac Enzymes: No results for input(s): CKTOTAL, CKMB, CKMBINDEX, TROPONINI in the last 168 hours. BNP (last 3 results)  Recent Labs  05/04/15 0500  BNP 861.0*    ProBNP (last 3 results) No results for input(s): PROBNP in the last 8760 hours.  CBG: No results for input(s): GLUCAP in the last 168 hours.  Recent Results (from the past 240 hour(s))  Gastrointestinal Panel by PCR , Stool     Status: None   Collection Time: 05/21/15  2:38 PM  Result Value Ref Range Status   Campylobacter species NOT DETECTED NOT DETECTED Final   Plesimonas shigelloides NOT DETECTED NOT DETECTED Final   Salmonella species NOT DETECTED NOT DETECTED Final  Yersinia enterocolitica NOT DETECTED NOT DETECTED Final   Vibrio species NOT DETECTED NOT DETECTED Final   Vibrio cholerae NOT DETECTED NOT DETECTED Final   Enteroaggregative E coli (EAEC) NOT DETECTED NOT DETECTED Final   Enteropathogenic E coli (EPEC) NOT DETECTED NOT DETECTED Final   Enterotoxigenic E coli (ETEC) NOT DETECTED NOT DETECTED Final   Shiga like toxin producing E coli (STEC) NOT DETECTED NOT DETECTED Final   E. coli O157 NOT DETECTED NOT DETECTED Final   Shigella/Enteroinvasive E coli (EIEC) NOT DETECTED NOT DETECTED Final   Cryptosporidium NOT DETECTED NOT DETECTED Final   Cyclospora cayetanensis  NOT DETECTED NOT DETECTED Final   Entamoeba histolytica NOT DETECTED NOT DETECTED Final   Giardia lamblia NOT DETECTED NOT DETECTED Final   Adenovirus F40/41 NOT DETECTED NOT DETECTED Final   Astrovirus NOT DETECTED NOT DETECTED Final   Norovirus GI/GII NOT DETECTED NOT DETECTED Final   Rotavirus A NOT DETECTED NOT DETECTED Final   Sapovirus (I, II, IV, and V) NOT DETECTED NOT DETECTED Final  C difficile quick scan w PCR reflex     Status: None   Collection Time: 05/21/15  2:38 PM  Result Value Ref Range Status   C Diff antigen NEGATIVE NEGATIVE Final   C Diff toxin NEGATIVE NEGATIVE Final   C Diff interpretation Negative for toxigenic C. difficile  Final     Studies: No results found.  Scheduled Meds: . aspirin EC  81 mg Oral Daily  . carvedilol  3.125 mg Oral BID WC  . fentaNYL  12.5 mcg Transdermal Q72H  . fentaNYL  25 mcg Transdermal Q72H  . lipase/protease/amylase  24,000 Units Oral TID WC  . mometasone-formoterol  2 puff Inhalation BID  . pantoprazole (PROTONIX) IV  40 mg Intravenous Q24H  . saccharomyces boulardii  250 mg Oral BID   Continuous Infusions:   Antibiotics Given (last 72 hours)    Date/Time Action Medication Dose Rate   05/22/15 1020 Given   metroNIDAZOLE (FLAGYL) IVPB 500 mg 500 mg 100 mL/hr   05/22/15 1741 Given   metroNIDAZOLE (FLAGYL) IVPB 500 mg 500 mg 100 mL/hr      Principal Problem:   Intractable nausea and vomiting Active Problems:   CAD (coronary artery disease)   Malignant neoplasm of pancreas (HCC)   Anemia of chronic disease   Protein-calorie malnutrition, severe   CHF (congestive heart failure) ([place])    Time spent: 25 min    [facility] Pager [phone]. If 7PM-7AM, please contact night-coverage at [email or web address], password [facility] 05/23/2015, 3:53 PM  LOS: 2 days

## 2015-05-23 NOTE — Progress Notes (Signed)
CSW consulted to assist with HCPOA .  PN reviewed. [facility] is assisting pt with HCPOA at tis time. CSW signing off.  Werner Lean LCSW [phone]

## 2015-05-24 ENCOUNTER — Encounter: Payer: Self-pay | Admitting: *Deleted

## 2015-05-24 DIAGNOSIS — C786 Secondary malignant neoplasm of retroperitoneum and peritoneum: Secondary | ICD-10-CM

## 2015-05-24 DIAGNOSIS — C257 Malignant neoplasm of other parts of pancreas: Secondary | ICD-10-CM

## 2015-05-24 DIAGNOSIS — J40 Bronchitis, not specified as acute or chronic: Secondary | ICD-10-CM

## 2015-05-24 DIAGNOSIS — R1110 Vomiting, unspecified: Secondary | ICD-10-CM

## 2015-05-24 DIAGNOSIS — C7800 Secondary malignant neoplasm of unspecified lung: Secondary | ICD-10-CM

## 2015-05-24 DIAGNOSIS — G893 Neoplasm related pain (acute) (chronic): Secondary | ICD-10-CM

## 2015-05-24 DIAGNOSIS — I509 Heart failure, unspecified: Secondary | ICD-10-CM

## 2015-05-24 LAB — CBC
HEMATOCRIT: 29.3 % — AB (ref 36.0–46.0)
Hemoglobin: 9.1 g/dL — ABNORMAL LOW (ref 12.0–15.0)
MCH: 25.8 pg — ABNORMAL LOW (ref 26.0–34.0)
MCHC: 31.1 g/dL (ref 30.0–36.0)
MCV: 83 fL (ref 78.0–100.0)
Platelets: 115 10*3/uL — ABNORMAL LOW (ref 150–400)
RBC: 3.53 MIL/uL — AB (ref 3.87–5.11)
RDW: 18 % — ABNORMAL HIGH (ref 11.5–15.5)
WBC: 6.4 10*3/uL (ref 4.0–10.5)

## 2015-05-24 LAB — BASIC METABOLIC PANEL
Anion gap: 6 (ref 5–15)
BUN: 7 mg/dL (ref 6–20)
CHLORIDE: 107 mmol/L (ref 101–111)
CO2: 26 mmol/L (ref 22–32)
Calcium: 8 mg/dL — ABNORMAL LOW (ref 8.9–10.3)
Creatinine, Ser: 0.46 mg/dL (ref 0.44–1.00)
GFR calc non Af Amer: >60 mL/min (ref >60)
Glucose, Bld: 97 mg/dL (ref 65–99)
POTASSIUM: 4 mmol/L (ref 3.5–5.1)
SODIUM: 139 mmol/L (ref 135–145)

## 2015-05-24 MED ORDER — CARVEDILOL 3.125 MG PO TABS
3.1250 mg | ORAL_TABLET | Freq: Two times a day (BID) | ORAL | Status: DC
  Administered 2015-05-24 – 2015-05-25 (×3): 3.125 mg via ORAL
  Filled 2015-05-24 (×6): qty 1

## 2015-05-24 MED ORDER — PANTOPRAZOLE SODIUM 40 MG PO TBEC
40.0000 mg | DELAYED_RELEASE_TABLET | ORAL | Status: DC
  Administered 2015-05-24 – 2015-05-25 (×2): 40 mg via ORAL
  Filled 2015-05-24 (×2): qty 1

## 2015-05-24 NOTE — Care Management Important Message (Signed)
Important Message  Patient Details  Name: ANNALESE FANTE MRN: [id number] Date of Birth: Nov 23, 1940   Medicare Important Message Given:  Yes    Camillo Flaming 05/24/2015, 9:06 AMImportant Message  Patient Details  Name: LILLYAN STAEBELL MRN: [id number] Date of Birth: 06-08-1940   Medicare Important Message Given:  Yes    Camillo Flaming 05/24/2015, 9:06 AM

## 2015-05-24 NOTE — Progress Notes (Signed)
**Note Tammy Schmitt** PROGRESS NOTE  [name] [id number] DOB: 26-Aug-1940 DOA: 05/20/2015 PCP: [name], NP   Subjective: Diet advanced, still has some nausea but no vomiting. Diarrhea resolved.  HPI: Tammy Schmitt is a 75 y.o. female with a past medical history of pancreatic cancer, CAD, systolic CHF, hyperlipidemia who comes to the emergency department with complaints of nausea, emesis and diarrhea multiple times a day after she had chemotherapy this past Tuesday.  Assessment/Plan:  Nausea and vomiting/diarrhea This is likely secondary to recent chemotherapy, came in with nausea, vomiting and diarrhea. Treat symptomatically with antiemetics, continue IV fluids. Protonix 40 mg IV every 24 hours GI pathogen and C. difficile is negative, use Lomotil if needed.  CAD (coronary artery disease) Stable. No chest pain. Resume current medications once tolerating by mouth.  Malignant neoplasm of pancreas [facility]) Continue treatment per oncology.  Anemia of chronic disease Monitor H&H.  CHF (congestive heart failure) (HCC) Stable and compensated. Resume current medications once tolerating oral intake.. Gentle and time limited IV hydration.  Hypokalemia -repleted with oral supplements.  Code Status: DNR Family Communication: patient Disposition Plan:    Consultants:    Procedures:      Objective: Filed Vitals:   05/24/15 0845 05/24/15 1405  BP: 106/52 108/67  Pulse: 82 86  Temp: 98.1 F (36.7 C) 98.2 F (36.8 C)  Resp: 13 12    Intake/Output Summary (Last 24 hours) at 05/24/15 1434 Last data filed at 05/24/15 0949  Gross per 24 hour  Intake    350 ml  Output      0 ml  Net    350 ml   Filed Weights   05/20/15 2134  Weight: 58.1 kg (128 lb 1.4 oz)    Exam:   General:  Ill appearing  Cardiovascular: rrr  Respiratory: clear  Abdomen: +BS, tender to palpation  Musculoskeletal: no edema   Data Reviewed: Basic Metabolic Panel:  Recent  Labs Lab 05/20/15 1624 05/21/15 0830 05/22/15 0500 05/23/15 0509 05/24/15 0347  NA 136 140 140 138 139  K 3.7 3.3* 3.5 3.4* 4.0  CL 96* 102 104 105 107  CO2 30 29 27 27 26   GLUCOSE 132* 94 90 98 97  BUN 20 18 13 9 7   CREATININE 0.60 0.54 0.39* 0.60 0.46  CALCIUM 8.7* 8.3* 8.2* 8.1* 8.0*  MG  --   --   --  1.3*  --    Liver Function Tests:  Recent Labs Lab 05/20/15 1624 05/21/15 0830  AST 51* 39  ALT 29 23  ALKPHOS 92 73  BILITOT 0.7 0.7  PROT 6.3* 5.3*  ALBUMIN 2.8* 2.5*    Recent Labs Lab 05/20/15 1624  LIPASE 26   No results for input(s): AMMONIA in the last 168 hours. CBC:  Recent Labs Lab 05/20/15 1624 05/21/15 0830 05/22/15 0500 05/23/15 0509 05/24/15 0347  WBC 7.3 5.8 3.8* 5.2 6.4  NEUTROABS 6.8 4.1  --   --   --   HGB 10.5* 9.8* 9.4* 9.1* 9.1*  HCT 33.8* 31.5* 30.1* 29.1* 29.3*  MCV 82.6 82.7 83.8 81.5 83.0  PLT 292 230 167 128* 115*   Cardiac Enzymes: No results for input(s): CKTOTAL, CKMB, CKMBINDEX, TROPONINI in the last 168 hours. BNP (last 3 results)  Recent Labs  05/04/15 0500  BNP 861.0*    ProBNP (last 3 results) No results for input(s): PROBNP in the last 8760 hours.  CBG: No results for input(s): GLUCAP in the last 168 hours.  Recent Results (from  the past 240 hour(s))  Gastrointestinal Panel by PCR , Stool     Status: None   Collection Time: 05/21/15  2:38 PM  Result Value Ref Range Status   Campylobacter species NOT DETECTED NOT DETECTED Final   Plesimonas shigelloides NOT DETECTED NOT DETECTED Final   Salmonella species NOT DETECTED NOT DETECTED Final   Yersinia enterocolitica NOT DETECTED NOT DETECTED Final   Vibrio species NOT DETECTED NOT DETECTED Final   Vibrio cholerae NOT DETECTED NOT DETECTED Final   Enteroaggregative E coli (EAEC) NOT DETECTED NOT DETECTED Final   Enteropathogenic E coli (EPEC) NOT DETECTED NOT DETECTED Final   Enterotoxigenic E coli (ETEC) NOT DETECTED NOT DETECTED Final   Shiga like toxin  producing E coli (STEC) NOT DETECTED NOT DETECTED Final   E. coli O157 NOT DETECTED NOT DETECTED Final   Shigella/Enteroinvasive E coli (EIEC) NOT DETECTED NOT DETECTED Final   Cryptosporidium NOT DETECTED NOT DETECTED Final   Cyclospora cayetanensis NOT DETECTED NOT DETECTED Final   Entamoeba histolytica NOT DETECTED NOT DETECTED Final   Giardia lamblia NOT DETECTED NOT DETECTED Final   Adenovirus F40/41 NOT DETECTED NOT DETECTED Final   Astrovirus NOT DETECTED NOT DETECTED Final   Norovirus GI/GII NOT DETECTED NOT DETECTED Final   Rotavirus A NOT DETECTED NOT DETECTED Final   Sapovirus (I, II, IV, and V) NOT DETECTED NOT DETECTED Final  C difficile quick scan w PCR reflex     Status: None   Collection Time: 05/21/15  2:38 PM  Result Value Ref Range Status   C Diff antigen NEGATIVE NEGATIVE Final   C Diff toxin NEGATIVE NEGATIVE Final   C Diff interpretation Negative for toxigenic C. difficile  Final     Studies: No results found.  Scheduled Meds: . aspirin EC  81 mg Oral Daily  . carvedilol  3.125 mg Oral BID WC  . fentaNYL  12.5 mcg Transdermal Q72H  . fentaNYL  25 mcg Transdermal Q72H  . lipase/protease/amylase  24,000 Units Oral TID WC  . mometasone-formoterol  2 puff Inhalation BID  . pantoprazole  40 mg Oral Q24H  . saccharomyces boulardii  250 mg Oral BID   Continuous Infusions:   Antibiotics Given (last 72 hours)    Date/Time Action Medication Dose Rate   05/22/15 1020 Given   metroNIDAZOLE (FLAGYL) IVPB 500 mg 500 mg 100 mL/hr   05/22/15 1741 Given   metroNIDAZOLE (FLAGYL) IVPB 500 mg 500 mg 100 mL/hr      Principal Problem:   Intractable nausea and vomiting Active Problems:   CAD (coronary artery disease)   Malignant neoplasm of pancreas (HCC)   Anemia of chronic disease   Protein-calorie malnutrition, severe   CHF (congestive heart failure) ([place])    Time spent: 25 min    [facility] Pager [phone]. If 7PM-7AM, please  contact night-coverage at [email or web address], password [email or web address] 05/24/2015, 2:34 PM  LOS: 3 days

## 2015-05-24 NOTE — Progress Notes (Signed)
Chaplain follow up with patient for support around end of life concerns.  Pt and chaplain explored whole body donation to medical schools and chaplain provided information from state medical examiner's office and [facility].    Tammy Schmitt reported she has not conversed with her son about her end of life wishes, as she feels "that conversation will not go well."  He has little support and she describes him wanting her to "do everything," whereas she feels ready to move toward palliative / EOL care.  Tammy Schmitt's brothers are her HCPOA per Advance Directive.    Will follow up in AM for continued support.    [place], [place]

## 2015-05-24 NOTE — Progress Notes (Signed)
PHARMACIST - PHYSICIAN COMMUNICATION  CONCERNING: IV to Oral Route Change Policy  RECOMMENDATION: This patient is receiving pantoprazole by the intravenous route.  Based on criteria approved by the Pharmacy and Therapeutics Committee, the intravenous medication(s) is/are being converted to the equivalent oral dose form(s).   DESCRIPTION: These criteria include:  The patient is eating (either orally or via tube) and/or has been taking other orally administered medications for a least 24 hours  The patient has no evidence of active gastrointestinal bleeding or impaired GI absorption (gastrectomy, short bowel, patient on TNA or NPO).  If you have questions about this conversion, please contact the Pharmacy Department  []   [phone] )  [facility] []   [phone] )  [facility] []   [phone] )  [facility] []   [phone] )  [facility] [x]   [phone] )  [place], PharmD candidate 05/24/2015 8:14 AM

## 2015-05-24 NOTE — Progress Notes (Signed)
Oncology Nurse Navigator Documentation  Oncology Nurse Navigator Flowsheets 05/24/2015  Navigator Location [facility] On[facility]  Navigator Encounter Type Other--Hospital visit  Telephone -  Abnormal Finding Date -  Treatment Initiated Date -  Patient Visit Type Inpatient  Treatment Phase -  Barriers/Navigation Needs Family concerns;Coordination of Care--expresses desire to transition to Hospice care in her home; concerned w/talking to her son about it. She does not want any further treatment.  Education Coping with Diagnosis/ Prognosis;Accessing Care/ Finding Providers  Interventions Coordination of Care--made Dr. Irene Limbo aware of her request. He will be seeing her today and make Hospice referral. Supportive listening provided.  Referrals -  Coordination of Care -  Support Groups/Services -  Acuity -  Time Spent with Patient [place] is very matter of fact about her condition and prognosis. She wants to be home and to be kept comfortable. Knows the conversation with her son may be difficult. He has no spouse or children or close friends since he moved here with her.

## 2015-05-25 LAB — BASIC METABOLIC PANEL
ANION GAP: 8 (ref 5–15)
BUN: 7 mg/dL (ref 6–20)
CHLORIDE: 106 mmol/L (ref 101–111)
CO2: 26 mmol/L (ref 22–32)
Calcium: 8 mg/dL — ABNORMAL LOW (ref 8.9–10.3)
Creatinine, Ser: 0.46 mg/dL (ref 0.44–1.00)
GFR calc non Af Amer: >60 mL/min (ref >60)
Glucose, Bld: 107 mg/dL — ABNORMAL HIGH (ref 65–99)
Potassium: 3.5 mmol/L (ref 3.5–5.1)
Sodium: 140 mmol/L (ref 135–145)

## 2015-05-25 NOTE — Progress Notes (Signed)
Nutrition Follow-up  DOCUMENTATION CODES:   Severe malnutrition in context of acute illness/injury  INTERVENTION:   - Continue encouraging PO intake of Regular Diet - Will continue to monitor for nutritional needs.  NUTRITION DIAGNOSIS:   Inadequate oral intake related to poor appetite (early satiety) as evidenced by per patient/family report.  Improving  GOAL:   Patient will meet greater than or equal to 90% of their needs  Minimal needs met on average.  MONITOR:   PO intake, Labs, Weight trends, I & O's, Skin, Diet advancement  REASON FOR ASSESSMENT:   Malnutrition Screening Tool    ASSESSMENT:   75 y.o. female with a past medical history of pancreatic cancer, CAD, systolic CHF, hyperlipidemia who comes to the emergency department with complaints of nausea and emesis multiple times a day after she had chemotherapy this past Tuesday. She denies fever, chills, diarrhea, constipation, hematochezia or melena.  Patient reports that her appetite is much improved, stating that she is able to eat a more than PTA.  Meal completion records of 100% per chart review.  Patient says she is still ordering smaller meals, but is able to eat all of the food she orders.  She reports that she occassionally has mild nausea but overall it is well controlled.  Patient hopes to get discharged home soon now that she knows she is tolerating a regular diet.  Reports that she can tell a huge difference when she coordinates her eating with the Creon medication.  Patient likely meeting minimal needs on average.  Will continue to monitor for needs. Medications reviewed: Creon - 24,000 units, protonix. Labs reviewed.   Diet Order:  Diet regular Room service appropriate?: Yes; Fluid consistency:: Thin  Skin:  Reviewed, no issues  Last BM:  3/27  Height:   Ht Readings from Last 1 Encounters:  05/20/15 _0  (1.473 m)    Weight:   Wt Readings from Last 1 Encounters:  05/20/15 128 lb 1.4 oz  (58.1 kg)    Ideal Body Weight:  42.18 kg (kg)  BMI:  Body mass index is 26.78 kg/(m^2).  Estimated Nutritional Needs:   Kcal:  1750-1920 (30-33 kcal/kg)  Protein:  81-93 grams (1.4-1.6 grams/kg)  Fluid:  >/= 1.8 L/day  EDUCATION NEEDS:   No education needs identified at this time  Veronda Prude, Dietetic Intern Pager: [phone]

## 2015-05-25 NOTE — Progress Notes (Signed)
PROGRESS NOTE  Tammy Schmitt [id number] DOB: 1940/03/15 DOA: 05/20/2015 PCP: [name], NP   Subjective: Nausea, vomiting and diarrhea resolved. Diet advanced, still feeling very weak. Seen today by oncology and transition to full hospice.  HPI: Tammy Schmitt is a 75 y.o. female with a past medical history of pancreatic cancer, CAD, systolic CHF, hyperlipidemia who comes to the emergency department with complaints of nausea, emesis and diarrhea multiple times a day after she had chemotherapy this past Tuesday.  Assessment/Plan:  Nausea and vomiting/diarrhea This is likely secondary to recent chemotherapy, came in with nausea, vomiting and diarrhea. Treat symptomatically with antiemetics, continue IV fluids. Protonix 40 mg IV every 24 hours GI pathogen and C. difficile is negative, use Lomotil if needed. Started to tolerate regular diet, likely to discharge in the morning.  CAD (coronary artery disease) Stable. No chest pain. Resume current medications once tolerating by mouth.  Malignant neoplasm of pancreas [facility]) Continue treatment per oncology.  Anemia of chronic disease Monitor H&H.  CHF (congestive heart failure) (HCC) Stable and compensated. Resume current medications once tolerating oral intake.. Gentle and time limited IV hydration.  Hypokalemia -repleted with oral supplements.  Severe protein calorie malnutrition In context of malignant neoplasm of the pancreas.  Code Status: DNR Family Communication: patient Disposition Plan:    Consultants:    Procedures:      Objective: Filed Vitals:   05/25/15 0901 05/25/15 1419  BP: 97/52 105/55  Pulse: 88 91  Temp:  98.7 F (37.1 C)  Resp:  16    Intake/Output Summary (Last 24 hours) at 05/25/15 1554 Last data filed at 05/25/15 0800  Gross per 24 hour  Intake    240 ml  Output      0 ml  Net    240 ml   Filed Weights   05/20/15 2134  Weight: 58.1 kg (128 lb 1.4 oz)     Exam:   General:  Ill appearing  Cardiovascular: rrr  Respiratory: clear  Abdomen: +BS, tender to palpation  Musculoskeletal: no edema   Data Reviewed: Basic Metabolic Panel:  Recent Labs Lab 05/21/15 0830 05/22/15 0500 05/23/15 0509 05/24/15 0347 05/25/15 0425  NA 140 140 138 139 140  K 3.3* 3.5 3.4* 4.0 3.5  CL 102 104 105 107 106  CO2 29 27 27 26 26   GLUCOSE 94 90 98 97 107*  BUN 18 13 9 7 7   CREATININE 0.54 0.39* 0.60 0.46 0.46  CALCIUM 8.3* 8.2* 8.1* 8.0* 8.0*  MG  --   --  1.3*  --   --    Liver Function Tests:  Recent Labs Lab 05/20/15 1624 05/21/15 0830  AST 51* 39  ALT 29 23  ALKPHOS 92 73  BILITOT 0.7 0.7  PROT 6.3* 5.3*  ALBUMIN 2.8* 2.5*    Recent Labs Lab 05/20/15 1624  LIPASE 26   No results for input(s): AMMONIA in the last 168 hours. CBC:  Recent Labs Lab 05/20/15 1624 05/21/15 0830 05/22/15 0500 05/23/15 0509 05/24/15 0347  WBC 7.3 5.8 3.8* 5.2 6.4  NEUTROABS 6.8 4.1  --   --   --   HGB 10.5* 9.8* 9.4* 9.1* 9.1*  HCT 33.8* 31.5* 30.1* 29.1* 29.3*  MCV 82.6 82.7 83.8 81.5 83.0  PLT 292 230 167 128* 115*   Cardiac Enzymes: No results for input(s): CKTOTAL, CKMB, CKMBINDEX, TROPONINI in the last 168 hours. BNP (last 3 results)  Recent Labs  05/04/15 0500  BNP 861.0*  ProBNP (last 3 results) No results for input(s): PROBNP in the last 8760 hours.  CBG: No results for input(s): GLUCAP in the last 168 hours.  Recent Results (from the past 240 hour(s))  Gastrointestinal Panel by PCR , Stool     Status: None   Collection Time: 05/21/15  2:38 PM  Result Value Ref Range Status   Campylobacter species NOT DETECTED NOT DETECTED Final   Plesimonas shigelloides NOT DETECTED NOT DETECTED Final   Salmonella species NOT DETECTED NOT DETECTED Final   Yersinia enterocolitica NOT DETECTED NOT DETECTED Final   Vibrio species NOT DETECTED NOT DETECTED Final   Vibrio cholerae NOT DETECTED NOT DETECTED Final    Enteroaggregative E coli (EAEC) NOT DETECTED NOT DETECTED Final   Enteropathogenic E coli (EPEC) NOT DETECTED NOT DETECTED Final   Enterotoxigenic E coli (ETEC) NOT DETECTED NOT DETECTED Final   Shiga like toxin producing E coli (STEC) NOT DETECTED NOT DETECTED Final   E. coli O157 NOT DETECTED NOT DETECTED Final   Shigella/Enteroinvasive E coli (EIEC) NOT DETECTED NOT DETECTED Final   Cryptosporidium NOT DETECTED NOT DETECTED Final   Cyclospora cayetanensis NOT DETECTED NOT DETECTED Final   Entamoeba histolytica NOT DETECTED NOT DETECTED Final   Giardia lamblia NOT DETECTED NOT DETECTED Final   Adenovirus F40/41 NOT DETECTED NOT DETECTED Final   Astrovirus NOT DETECTED NOT DETECTED Final   Norovirus GI/GII NOT DETECTED NOT DETECTED Final   Rotavirus A NOT DETECTED NOT DETECTED Final   Sapovirus (I, II, IV, and V) NOT DETECTED NOT DETECTED Final  C difficile quick scan w PCR reflex     Status: None   Collection Time: 05/21/15  2:38 PM  Result Value Ref Range Status   C Diff antigen NEGATIVE NEGATIVE Final   C Diff toxin NEGATIVE NEGATIVE Final   C Diff interpretation Negative for toxigenic C. difficile  Final     Studies: No results found.  Scheduled Meds: . aspirin EC  81 mg Oral Daily  . carvedilol  3.125 mg Oral BID WC  . fentaNYL  12.5 mcg Transdermal Q72H  . fentaNYL  25 mcg Transdermal Q72H  . lipase/protease/amylase  24,000 Units Oral TID WC  . mometasone-formoterol  2 puff Inhalation BID  . pantoprazole  40 mg Oral Q24H  . saccharomyces boulardii  250 mg Oral BID   Continuous Infusions:   Antibiotics Given (last 72 hours)    Date/Time Action Medication Dose Rate   05/22/15 1741 Given   metroNIDAZOLE (FLAGYL) IVPB 500 mg 500 mg 100 mL/hr      Principal Problem:   Intractable nausea and vomiting Active Problems:   CAD (coronary artery disease)   Malignant neoplasm of pancreas (HCC)   Anemia of chronic disease   Protein-calorie malnutrition, severe   CHF  (congestive heart failure) ([place])    Time spent: 25 min    [facility] Pager [phone]. If 7PM-7AM, please contact night-coverage at [email or web address], password [facility] 05/25/2015, 3:54 PM  LOS: 4 days

## 2015-05-25 NOTE — Progress Notes (Signed)
Pt had been active with [facility] up until 05/14/15 when she revoked hospice to persue treatment (information received from [place] rep).  If pt wants to resume hospice services will need MD to order CM consult to offer hospice choice.  CM will continue to follow. Marney Doctor RN,BSN,NCM [phone]

## 2015-05-25 NOTE — Progress Notes (Signed)
[facility]   HEMATOLOGY/ONCOLOGY INPATIENT PROGRESS NOTE  Date of Service: 05/24/2015  Inpatient Attending: .Verlee Monte, MD   SUBJECTIVE  I met with Mrs. Tammy Schmitt last evening with her brother at bedside. We discussed her current symptoms. She notes her diarrhea is getting better but still having some nausea. Certainly couldn't be chemotherapy associated diarrhea she does have significant omental involvement as well which could cause some of her symptoms. Also notably she was on doxycycline for bronchitis and this can cause antibiotic associated diarrhea. GI panel and C. difficile was negative. Patient overall notes anorexia fatigue with declining functional status and chooses to not proceed with any other additional chemotherapy. She prefers to proceed with comfort cares and home hospice services at this time which we discussed was quite a reasonable approach. She reached that logical conclusion after discussing options including single agent gemcitabine, switching chemotherapy regimens. She appears quite comfortable with this decision and notes that she is working on trying to donate her body for the advancement of medical science.  OBJECTIVE:  NAD  PHYSICAL EXAMINATION: . Filed Vitals:   05/24/15 2049 05/25/15 0513 05/25/15 0901 05/25/15 1044  BP:  95/55 97/52   Pulse:  78 88   Temp:  98.1 F (36.7 C)    TempSrc:  Oral    Resp:  15    Height:      Weight:      SpO2: 96% 96%  92%   Filed Weights   05/20/15 2134  Weight: 128 lb 1.4 oz (58.1 kg)   .Body mass index is 26.78 kg/(m^2).  GENERAL:alert, in no acute distress and fatigued appearing LUNGS: clear to auscultation HEART: regular rate & rhythm ABDOMEN: Abdomen soft, non-tender, normoactive bowel sounds  Musculoskeletal: no cyanosis of digits and no clubbing  PSYCH: alert & oriented x 3 with fluent speech NEURO: no focal motor/sensory deficits  MEDICAL HISTORY:  Past Medical History  Diagnosis Date  . Cancer  ([place])   . Pancreatic cancer ([place])   . CAD (coronary artery disease)   . Systolic CHF, chronic ([place])   . Hyperlipidemia     SURGICAL HISTORY: Past Surgical History  Procedure Laterality Date  . Breast surgery Left     lymph nodes removed also  . Eye surgery Bilateral     cataracts removed  . Ovary surgery      one  tube removed , one ovary trimmed down  . Heart bypass      x 2, stent  . Coronary artery bypass graft  2001    x 2  . Cardiac catheterization  2001  . Eus N/A 03/08/2015    Procedure: UPPER ENDOSCOPIC ULTRASOUND (EUS) LINEAR;  Surgeon: Milus Banister, MD;  Location: [facility];  Service: Endoscopy;  Laterality: N/A;  radial linear  . Colonoscopy    . Laparoscopy N/A 04/11/2015    Procedure: LAPAROSCOPY DIAGNOSTIC;  Surgeon: Stark Klein, MD;  Location: [facility];  Service: General;  Laterality: N/A;    SOCIAL HISTORY: Social History   Social History  . Marital Status: Divorced    Spouse Name: N/A  . Number of Children: 3  . Years of Education: N/A   Occupational History  . retired    Social History Main Topics  . Smoking status: Former Smoker -- 45 years    Types: Cigarettes    Quit date: 12/26/1999  . Smokeless tobacco: Never Used  . Alcohol Use: No  . Drug Use: No  . Sexual Activity: Not on file  Other Topics Concern  . Not on file   Social History Narrative   Retired from working with disabled individuals    Two sons and one daughter (daughter and one son have [place]   Divorced, lives alone; moved to [place] from [place] May 2016   She likes to Psychologist, occupational and exercise.     FAMILY HISTORY: Family History  Problem Relation Age of Onset  . Breast cancer Mother 50  . Heart disease Father   . Heart disease Brother   . Esophageal cancer Daughter 66    died at 78  . Leukemia Son 29  . Colon cancer Neg Hx   . Stomach cancer Neg Hx     ALLERGIES:  is allergic to statins; ace inhibitors; codeine; and levaquin.  MEDICATIONS:  Scheduled Meds: . aspirin  EC  81 mg Oral Daily  . carvedilol  3.125 mg Oral BID WC  . fentaNYL  12.5 mcg Transdermal Q72H  . fentaNYL  25 mcg Transdermal Q72H  . lipase/protease/amylase  24,000 Units Oral TID WC  . mometasone-formoterol  2 puff Inhalation BID  . pantoprazole  40 mg Oral Q24H  . saccharomyces boulardii  250 mg Oral BID   Continuous Infusions:  PRN Meds:.albuterol, artificial tears, diphenoxylate-atropine, HYDROmorphone (DILAUDID) injection, LORazepam, nitroGLYCERIN, ondansetron (ZOFRAN) IV, promethazine, scopolamine, sodium chloride flush  REVIEW OF SYSTEMS:    10 Point review of Systems was done is negative except as noted above.   LABORATORY DATA:  I have reviewed the data as listed  . CBC Latest Ref Rng 05/24/2015 05/23/2015 05/22/2015  WBC 4.0 - 10.5 K/uL 6.4 5.2 3.8(L)  Hemoglobin 12.0 - 15.0 g/dL 9.1(L) 9.1(L) 9.4(L)  Hematocrit 36.0 - 46.0 % 29.3(L) 29.1(L) 30.1(L)  Platelets 150 - 400 K/uL 115(L) 128(L) 167    . CMP Latest Ref Rng 05/25/2015 05/24/2015 05/23/2015  Glucose 65 - 99 mg/dL 107(H) 97 98  BUN 6 - 20 mg/dL '7 7 9  ' Creatinine 0.44 - 1.00 mg/dL 0.46 0.46 0.60  Sodium 135 - 145 mmol/L 140 139 138  Potassium 3.5 - 5.1 mmol/L 3.5 4.0 3.4(L)  Chloride 101 - 111 mmol/L 106 107 105  CO2 22 - 32 mmol/L '26 26 27  ' Calcium 8.9 - 10.3 mg/dL 8.0(L) 8.0(L) 8.1(L)  Total Protein 6.5 - 8.1 g/dL - - -  Total Bilirubin 0.3 - 1.2 mg/dL - - -  Alkaline Phos 38 - 126 U/L - - -  AST 15 - 41 U/L - - -  ALT 14 - 54 U/L - - -     RADIOGRAPHIC STUDIES: I have personally reviewed the radiological images as listed and agreed with the findings in the report. Dg Chest 2 View  04/30/2015  CLINICAL DATA:  New onset fever. Nausea this morning. Patient on chemotherapy (most recent 5 days prior) for pancreatic cancer. EXAM: CHEST  2 VIEW COMPARISON:  Chest radiographs 04/11/2014, PET-CT 03/29/2015 FINDINGS: Tip of the right chest port in the SVC. Patient is post median sternotomy. Cardiomediastinal  contours are unchanged. There is a moderate retrocardiac hiatal hernia. Linear density in the right midlung may be atelectasis, pleural thickening or minimal fluid in the minor fissure. No confluent airspace disease. Minimal blunting of both costophrenic angles, may reflect small pleural effusions. No pneumothorax. Small pulmonary nodules on PET-CT not well seen radiographically. There are surgical clips in the left axilla. IMPRESSION: 1. No evidence of pneumonia. 2. Linear right midlung opacity may be atelectasis, pleural thickening or fluid in the right minor fissure.  3. Question small pleural effusions. Electronically Signed   By: Jeb Levering M.D.   On: 04/30/2015 20:02   Ct Head Wo Contrast  05/03/2015  CLINICAL DATA:  Dizzy with unsteady gait.  Stroke-like symptoms. EXAM: CT HEAD WITHOUT CONTRAST TECHNIQUE: Contiguous axial images were obtained from the base of the skull through the vertex without intravenous contrast. COMPARISON:  None. FINDINGS: No evidence for acute infarction, hemorrhage, mass lesion, hydrocephalus, or extra-axial fluid. Mild cerebral and cerebellar atrophy. Hypoattenuation of white matter suggesting chronic microvascular ischemic change. Calvarium intact. No sinus or mastoid air fluid levels. Vascular calcification affects the carotid siphons and distal vertebral arteries. IMPRESSION: Mild atrophy. No acute intracranial findings. No mass lesion is identified. Electronically Signed   By: Staci Righter M.D.   On: 05/03/2015 20:06   Ct Abdomen Pelvis W Contrast  04/30/2015  CLINICAL DATA:  New onset fever and nausea. Abdominal tenderness. Recent diagnosis of pancreatic cancer. Most recent chemotherapy five days prior 04/26/2015. EXAM: CT ABDOMEN AND PELVIS WITH CONTRAST TECHNIQUE: Multidetector CT imaging of the abdomen and pelvis was performed using the standard protocol following bolus administration of intravenous contrast. CONTRAST:  174m OMNIPAQUE IOHEXOL 300 MG/ML  SOLN  COMPARISON:  PET-CT 10/16/2015.  Abdominal CT 02/09/2015 FINDINGS: Lower chest: Tiny bilateral pleural effusions with adjacent atelectasis at the lung bases. Hiatal hernia unchanged in size. Coronary artery calcifications and atherosclerosis of the distal thoracic aorta. Liver: Scattered subcentimeter hypodensities throughout the liver, nonspecific, not significantly changed from prior exam. Hepatobiliary: Gallbladder physiologically distended, no calcified stone. There is a Phrygian cap. No biliary dilatation. Pancreas: Ill-defined hypodense lesion adjacent to the uncinate process of the pancreas and duodenum has diminished in size currently measuring 2.6 x 1.9 cm, previously 4.0 x 3.5 cm on prior CT. No pancreatic ductal dilatation. Spleen: No focal lesion. Adrenal glands: No nodule. Kidneys: Symmetric renal enhancement. No hydronephrosis. No perinephric stranding. Stomach/Bowel: Stomach is decompressed, hiatal hernia again seen. There are no dilated or thickened small bowel loops. Small volume of stool throughout the colon without colonic wall thickening. The appendix is not visualized. Vascular/Lymphatic: No retroperitoneal adenopathy. Abdominal aorta is normal in caliber. Dense atherosclerosis without aneurysm. Retroperitoneal mass encompasses the superior mesenteric artery as described previously. Omental nodularity and soft tissue density is, with minimal increase in the interim. For example, the anterior omental nodule measures 1.6 cm, previously 1.3 cm. The small bowel mesenteric nodules are difficult to differentiate from mesenteric ascites. Reproductive: Left ovarian mass again seen, 4.3 x 4.0 cm, not significantly changed allowing for differences in caliper placement. Uterus remains in situ. Right ovary not seen. Bladder: Completely decompressed. Other: Moderate volume of intra-abdominal and pelvic ascites, grossly unchanged to minimally increased from prior PET. Diffuse omental nodularity and soft  tissue stranding. No free air. Musculoskeletal: There are no acute or suspicious osseous abnormalities. Degenerative change in the spine. IMPRESSION: 1. Decreased size of the soft tissue mass in the retroperitoneum, likely arising from the uncinate process of the pancreas versus less likely transverse duodenum. There is however increased size of omental nodules from prior PET. Diffuse omental and peritoneal nodularity again seen. Volume of intra-abdominal and pelvic ascites is unchanged to minimally increased. 2. Small pleural effusions with adjacent atelectasis at the lung bases. 3. Left ovarian lesion, unchanged in the interim. 4. Hiatal hernia again seen. Additional chronic findings as described. Electronically Signed   By: MJeb LeveringM.D.   On: 04/30/2015 22:11   Dg Chest Port 1 View  05/03/2015  CLINICAL DATA:  Cough for 1 day, shortness of Breath EXAM: PORTABLE CHEST 1 VIEW COMPARISON:  04/30/2015 FINDINGS: Cardiomegaly again noted. Stable linear atelectasis or fluid in right minor fissure in right perihilar region. Right IJ Port-A-Cath is unchanged in position. Status post median sternotomy. Again noted surgical clips in left axilla. Mild left basilar atelectasis. Subtle mild perihilar bronchitic changes. No segmental infiltrate or pulmonary edema. IMPRESSION: No infiltrate or pulmonary edema. Subtle mild perihilar bronchitic changes. Status post median sternotomy. Stable right IJ port Port-A-Cath position. Again noted linear atelectasis or small amount of fluid in right minor fissure perihilar region. Mild left basilar atelectasis. Electronically Signed   By: Lahoma Crocker M.D.   On: 05/03/2015 08:51   Dg Abd Acute W/chest  05/20/2015  CLINICAL DATA:  She c/o n/v--had chemo this past Tues. She is awake, alert and in no distress.Hx of pancreatic cancer. EXAM: DG ABDOMEN ACUTE W/ 1V CHEST COMPARISON:  05/03/2015 FINDINGS: Normal bowel gas pattern. No free air. No evidence of renal or ureteral stones.  Changes from cardiac surgery are stable. Cardiac silhouette is normal in size and configuration. There is a moderate hiatal hernia. No mediastinal or hilar masses. Small area of focal thickening noted along the minor fissure, stable. Mild linear atelectasis at the left lung base. IMPRESSION: 1. No acute findings in the abdomen pelvis. No evidence of bowel obstruction or free air. 2. No acute cardiopulmonary disease. Electronically Signed   By: Lajean Manes M.D.   On: 05/20/2015 16:03    ASSESSMENT & PLAN:   #1 metastatic pancreatic cancer with present reticulocyte and omental metastases, lung metastases and possible pulmonary metastases. Patient is status post 1 cycle of gemcitabine and Abraxane. After the day 1 cycle 1 dose of chemotherapy she was admitted with acute bronchitis and fluid overload. Now after day 15 cycle 1 of chemotherapy she is admitted with significant nausea and vomiting. Abdominal x-ray showed no evidence of bowel obstruction or free air. GI pathogen panel is negative. C. difficile was negative.  Likely chemotherapy-induced diarrhea worsens or antibiotic-related diarrhea due to doxycycline. Patient was on significant laxatives to treat her opiate related constipation. 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 She notes that she was treated with a left sided lumpectomy, SLNB, adjuvant chemotherapy and RT and tamoxifen which she took only for 1 yr. 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 -Agree with when necessary Lomotil . -Typically would not use probiotics with ongoing chemotherapy but it would be okay in her case given that she has chosen to forego any additional chemotherapy and transfer to home hospice services and  continued comfort cares . -Social worker evaluation for referral to hospice services with her current agency that is currently providing palliative care services . -Continue current pain medications . -Zofran when necessary , scopolamine patch and dexamethasone 4 mg daily for nausea . -Continue Creon with meals to avoid malabsorption related diarrhea .  -Follow-up with Dr. KIrene Limboon an as-needed basis on discharge .   GSullivan LoneMD MPalmerAAHIVMS SHorizon Eye Care PaCCandescent Eye Surgicenter LLCHematology/Oncology Physician CGood [facility] (Office):       [phone](Work cell):  [phone](Fax):           [phone]

## 2015-05-26 LAB — BASIC METABOLIC PANEL
ANION GAP: 8 (ref 5–15)
BUN: 9 mg/dL (ref 6–20)
CALCIUM: 7.9 mg/dL — AB (ref 8.9–10.3)
CO2: 25 mmol/L (ref 22–32)
Chloride: 106 mmol/L (ref 101–111)
Creatinine, Ser: 0.41 mg/dL — ABNORMAL LOW (ref 0.44–1.00)
GFR calc Af Amer: >60 mL/min (ref >60)
GFR calc non Af Amer: >60 mL/min (ref >60)
Glucose, Bld: 96 mg/dL (ref 65–99)
POTASSIUM: 3.4 mmol/L — AB (ref 3.5–5.1)
SODIUM: 139 mmol/L (ref 135–145)

## 2015-05-26 MED ORDER — HYDROMORPHONE HCL 2 MG PO TABS: 2.0000 mg | ORAL_TABLET | ORAL | Status: AC | PRN

## 2015-05-26 MED ORDER — HEPARIN SOD (PORK) LOCK FLUSH 100 UNIT/ML IV SOLN
500.0000 [IU] | INTRAVENOUS | Status: AC | PRN
  Administered 2015-05-26: 500 [IU]

## 2015-05-26 MED ORDER — POTASSIUM CHLORIDE CRYS ER 20 MEQ PO TBCR
40.0000 meq | EXTENDED_RELEASE_TABLET | Freq: Once | ORAL | Status: DC
  Filled 2015-05-26: qty 2

## 2015-05-26 MED ORDER — LORAZEPAM 0.5 MG PO TABS: 0.5000 mg | ORAL_TABLET | Freq: Four times a day (QID) | ORAL | Status: AC | PRN

## 2015-05-26 NOTE — Care Management Note (Signed)
Case Management Note  Patient Details  Name: Tammy Schmitt MRN: [id number] Date of Birth: Mar 19, 1940  Subjective/Objective:     metastatic pancreatic cancer                Action/Plan: Discharge Planning: AVS reviewed:   NCM spoke to pt at bedside. States she had [facility] in the past and wanted [facility] again with their service. She has hospital bed and oxygen at home. Also has a RW and tub seat. [place], spoke to [place], [place] RN # [phone]. Pt is not active with [facility]. Pt would be a new referral. States she would review information and follow up with pt at home. Verified contact number and address for Hospice On call RN. Pt states her son is at home to assist with her care.    Expected Discharge Date:  05/26/2015            Expected Discharge Plan:  Home w Hospice Care  In-House Referral:  NA  Discharge planning Services  CM Consult  Post Acute Care Choice:  Hospice Choice offered to:  Patient  DME Arranged:  N/A DME Agency:  NA  HH Arranged:  RN [place] Agency:  [place]  Status of Service:  Completed, signed off  Medicare Important Message Given:  Yes Date Medicare IM Given:    Medicare IM give by:    Date Additional Medicare IM Given:    Additional Medicare Important Message give by:     If discussed at [place] of Stay Meetings, dates discussed:    Additional Comments:  Erenest Rasher, RN 05/26/2015, 1:10 PM

## 2015-05-26 NOTE — Discharge Summary (Signed)
Physician Discharge Summary  KINNIDI GRATTON [id number] DOB: 25-Sep-1940 DOA: 05/20/2015  PCP: [name], NP  Admit date: 05/20/2015 Discharge date: 05/26/2015  Time spent: 40 minutes  Recommendations for Outpatient Follow-up:  1. Follow-up with hospice services at home.   Discharge Diagnoses:  Principal Problem:   Intractable nausea and vomiting Active Problems:   CAD (coronary artery disease)   Malignant neoplasm of pancreas (HCC)   Anemia of chronic disease   Protein-calorie malnutrition, severe   CHF (congestive heart failure) ([place])   Discharge Condition: Stable  Diet recommendation: Heart healthy  Filed Weights   05/20/15 2134  Weight: 58.1 kg (128 lb 1.4 oz)    History of present illness:  Tammy Schmitt is a 75 y.o. female with a past medical history of pancreatic cancer, CAD, systolic CHF, hyperlipidemia who comes to the emergency department with complaints of nausea and emesis multiple times a day after she had chemotherapy this past Tuesday. She denies fever, chills, diarrhea, constipation, hematochezia or melena.  When seen, the patient was in no acute distress. She is stated she was feeling better after treatment. Workup in the emergency department shows hematology and chemistry labs similar to baseline and negative plain films of abdomen and chest.  Hospital Course:   Nausea and vomiting/diarrhea This is likely secondary to recent chemotherapy, came in with nausea, vomiting and diarrhea. Treat symptomatically with antiemetics and IV fluids. GI pathogen and C. difficile is negative, use Lomotil if needed. This is all resolved, patient tolerated regular diet very well, discharged home to follow-up with hospice services at home.  CAD (coronary artery disease) Stable. No chest pain. Resume current medications once tolerating by mouth.  Malignant neoplasm of pancreas [facility]) Continue treatment per oncology.  Anemia of chronic disease Monitor  H&H.  CHF (congestive heart failure) (HCC) Stable and compensated. Was on gentle IV fluid hydration did not show any signs of decompensation. Home medications restarted at the time of discharge.  Hypokalemia -Repleted with oral supplements. On day of discharge given 40 mEq of potassium because of K of 3.4.  Severe protein calorie malnutrition In context of malignant neoplasm of the pancreas.   Procedures:  None  Consultations:  Oncology  Discharge Exam: Filed Vitals:   05/25/15 1419 05/26/15 0506  BP: 105/55 101/54  Pulse: 91 85  Temp: 98.7 F (37.1 C) 98.6 F (37 C)  Resp: 16 14   General: Alert and awake, oriented x3, not in any acute distress. HEENT: anicteric sclera, pupils reactive to light and accommodation, EOMI CVS: S1-S2 clear, no murmur rubs or gallops Chest: clear to auscultation bilaterally, no wheezing, rales or rhonchi Abdomen: soft nontender, nondistended, normal bowel sounds, no organomegaly Extremities: no cyanosis, clubbing or edema noted bilaterally Neuro: Cranial nerves II-XII intact, no focal neurological deficits  Discharge Instructions   Discharge Instructions    Diet - low sodium heart healthy    Complete by:  As directed      Increase activity slowly    Complete by:  As directed           Current Discharge Medication List    CONTINUE these medications which have CHANGED   Details  HYDROmorphone (DILAUDID) 2 MG tablet Take 1 tablet (2 mg total) by mouth every 4 (four) hours as needed for severe pain. Qty: 20 tablet, Refills: 0    LORazepam (ATIVAN) 0.5 MG tablet Take 1 tablet (0.5 mg total) by mouth every 6 (six) hours as needed (Nausea or vomiting). 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      albuterol (PROVENTIL HFA;VENTOLIN HFA) 108 (90 Base) MCG/ACT inhaler Inhale 1-2 puffs into the lungs every 4 (four) hours as needed for wheezing or shortness of breath. Qty: 1 Inhaler, Refills: 0    aspirin EC 81 MG tablet Take 1 tablet (81 mg total) by mouth daily.    carvedilol (COREG) 3.125 MG tablet Take 1 tablet (3.125 mg total) by mouth 2 (two) times daily with a meal. Qty: 60 tablet, Refills: 0    dexamethasone (DECADRON) 4 MG tablet Take 2 tablets (8 mg total) by mouth daily. For 3-4 days after chemotherapy for nausea Qty: 30 tablet, Refills: 1    feeding supplement, ENSURE ENLIVE, (ENSURE ENLIVE) LIQD Take 237 mLs by mouth 3 (three) times daily with meals. Qty: 237 mL, Refills: 12    fentaNYL (DURAGESIC - DOSED MCG/HR) 12 MCG/HR Place 1 patch (12.5 mcg total) onto the skin every 3 (three) days. Along with 62mcg/h patch Qty: 10 patch, Refills: 0    fentaNYL (DURAGESIC - DOSED MCG/HR) 25 MCG/HR patch Place 1 patch (25 mcg total) onto the skin every 3 (three) days. Along with 12.63mcg/h patch Qty: 10 patch, Refills: 0    furosemide (LASIX) 20 MG tablet Take 0.5 tablets (10 mg total) by mouth daily. 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 Qty: 1 Inhaler, Refills: 0    nitroGLYCERIN (NITROSTAT) 0.4 MG SL tablet Place 0.4 mg under the tongue every 5 (five) minutes as needed for chest pain. Reported on 03/19/2015    omeprazole (PRILOSEC) 40 MG capsule Take 1 capsule (40 mg total) by mouth 2 (two) times daily. Qty: 60 capsule, Refills: 11    ondansetron (ZOFRAN) 8 MG tablet Take 1 tablet (8 mg total) by mouth 2 (two) times daily as needed (Nausea or vomiting). Qty: 30 tablet, Refills: 1   Associated Diagnoses:  Pancreatic adenocarcinoma ([place]); Peritoneal metastases (HCC)    Pancrelipase, Lip-Prot-Amyl, 24000 units CPEP Take 1 capsule (24,000 Units total) by mouth 3 (three) times daily with meals. Qty: 180 capsule, Refills: 1    Polyethyl Glycol-Propyl Glycol (SYSTANE OP) Apply 1 drop to eye daily as needed (for dry eyes).    potassium chloride 20 MEQ TBCR Take 20 mEq by mouth daily. Qty: 15 tablet, Refills: 0    scopolamine (TRANSDERM-SCOP) 1 MG/3DAYS Place 1 patch (1.5 mg total) onto the skin every 3 (three) days as needed (for nausea). Qty: 5 patch, Refills: 1    senna-docusate (SENNA S) 8.6-50 MG tablet Take 2 tablets by mouth 2 (two) times daily. Qty: 120 tablet, Refills: 3    Polyvinyl Alcohol-Povidone (REFRESH OP) Apply 1 drop to eye daily as needed (for dry eyes).       Allergies  Allergen Reactions  . Statins Other (See Comments)    Muscle pain    . Ace Inhibitors     From Previous Records  . Codeine Other (See Comments)    Doesn't feel well   . Levaquin [Levofloxacin In D5w]  From previous records   Follow-up Information    Follow up with [name], NP In 1 week.   Specialty:  Family Medicine   Contact information:   [address] [place] [address] [phone]       Follow up with [facility].   Why:  Hospice RN    Contact information:   [address] [place] [address] [phone]        The results of significant diagnostics from this hospitalization (including imaging, microbiology, ancillary and laboratory) are listed below for reference.    Significant Diagnostic Studies: Dg Chest 2 View  04/30/2015  CLINICAL DATA:  New onset fever. Nausea this morning. Patient on chemotherapy (most recent 5 days prior) for pancreatic cancer. EXAM: CHEST  2 VIEW COMPARISON:  Chest radiographs 04/11/2014, PET-CT 03/29/2015 FINDINGS: Tip of the right chest port in the SVC. Patient is post median sternotomy. Cardiomediastinal contours  are unchanged. There is a moderate retrocardiac hiatal hernia. Linear density in the right midlung may be atelectasis, pleural thickening or minimal fluid in the minor fissure. No confluent airspace disease. Minimal blunting of both costophrenic angles, may reflect small pleural effusions. No pneumothorax. Small pulmonary nodules on PET-CT not well seen radiographically. There are surgical clips in the left axilla. IMPRESSION: 1. No evidence of pneumonia. 2. Linear right midlung opacity may be atelectasis, pleural thickening or fluid in the right minor fissure. 3. Question small pleural effusions. Electronically Signed   By: Jeb Levering M.D.   On: 04/30/2015 20:02   Ct Head Wo Contrast  05/03/2015  CLINICAL DATA:  Dizzy with unsteady gait.  Stroke-like symptoms. EXAM: CT HEAD WITHOUT CONTRAST TECHNIQUE: Contiguous axial images were obtained from the base of the skull through the vertex without intravenous contrast. COMPARISON:  None. FINDINGS: No evidence for acute infarction, hemorrhage, mass lesion, hydrocephalus, or extra-axial fluid. Mild cerebral and cerebellar atrophy. Hypoattenuation of white matter suggesting chronic microvascular ischemic change. Calvarium intact. No sinus or mastoid air fluid levels. Vascular calcification affects the carotid siphons and distal vertebral arteries. IMPRESSION: Mild atrophy. No acute intracranial findings. No mass lesion is identified. Electronically Signed   By: Staci Righter M.D.   On: 05/03/2015 20:06   Ct Abdomen Pelvis W Contrast  04/30/2015  CLINICAL DATA:  New onset fever and nausea. Abdominal tenderness. Recent diagnosis of pancreatic cancer. Most recent chemotherapy five days prior 04/26/2015. EXAM: CT ABDOMEN AND PELVIS WITH CONTRAST TECHNIQUE: Multidetector CT imaging of the abdomen and pelvis was performed using the standard protocol following bolus administration of intravenous contrast. CONTRAST:  173mL OMNIPAQUE IOHEXOL 300 MG/ML  SOLN COMPARISON:   PET-CT 10/16/2015.  Abdominal CT 02/09/2015 FINDINGS: Lower chest: Tiny bilateral pleural effusions with adjacent atelectasis at the lung bases. Hiatal hernia unchanged in size. Coronary artery calcifications and atherosclerosis of the distal thoracic aorta. Liver: Scattered subcentimeter hypodensities throughout the liver, nonspecific, not significantly changed from prior exam. Hepatobiliary: Gallbladder physiologically distended, no calcified stone. There is a Phrygian cap. No biliary dilatation. Pancreas: Ill-defined hypodense lesion adjacent to the uncinate process of the pancreas and duodenum has diminished in size currently measuring 2.6 x 1.9 cm, previously 4.0 x 3.5 cm on prior CT. No pancreatic ductal dilatation. Spleen: No focal lesion. Adrenal glands: No nodule. Kidneys: Symmetric renal enhancement. No hydronephrosis. No perinephric stranding. Stomach/Bowel: Stomach is decompressed, hiatal hernia again seen. There are no dilated or thickened small bowel loops. Small volume of stool throughout the colon without colonic wall thickening. The  appendix is not visualized. Vascular/Lymphatic: No retroperitoneal adenopathy. Abdominal aorta is normal in caliber. Dense atherosclerosis without aneurysm. Retroperitoneal mass encompasses the superior mesenteric artery as described previously. Omental nodularity and soft tissue density is, with minimal increase in the interim. For example, the anterior omental nodule measures 1.6 cm, previously 1.3 cm. The small bowel mesenteric nodules are difficult to differentiate from mesenteric ascites. Reproductive: Left ovarian mass again seen, 4.3 x 4.0 cm, not significantly changed allowing for differences in caliper placement. Uterus remains in situ. Right ovary not seen. Bladder: Completely decompressed. Other: Moderate volume of intra-abdominal and pelvic ascites, grossly unchanged to minimally increased from prior PET. Diffuse omental nodularity and soft tissue stranding.  No free air. Musculoskeletal: There are no acute or suspicious osseous abnormalities. Degenerative change in the spine. IMPRESSION: 1. Decreased size of the soft tissue mass in the retroperitoneum, likely arising from the uncinate process of the pancreas versus less likely transverse duodenum. There is however increased size of omental nodules from prior PET. Diffuse omental and peritoneal nodularity again seen. Volume of intra-abdominal and pelvic ascites is unchanged to minimally increased. 2. Small pleural effusions with adjacent atelectasis at the lung bases. 3. Left ovarian lesion, unchanged in the interim. 4. Hiatal hernia again seen. Additional chronic findings as described. Electronically Signed   By: Jeb Levering M.D.   On: 04/30/2015 22:11   Dg Chest Port 1 View  05/03/2015  CLINICAL DATA:  Cough for 1 day, shortness of Breath EXAM: PORTABLE CHEST 1 VIEW COMPARISON:  04/30/2015 FINDINGS: Cardiomegaly again noted. Stable linear atelectasis or fluid in right minor fissure in right perihilar region. Right IJ Port-A-Cath is unchanged in position. Status post median sternotomy. Again noted surgical clips in left axilla. Mild left basilar atelectasis. Subtle mild perihilar bronchitic changes. No segmental infiltrate or pulmonary edema. IMPRESSION: No infiltrate or pulmonary edema. Subtle mild perihilar bronchitic changes. Status post median sternotomy. Stable right IJ port Port-A-Cath position. Again noted linear atelectasis or small amount of fluid in right minor fissure perihilar region. Mild left basilar atelectasis. Electronically Signed   By: Lahoma Crocker M.D.   On: 05/03/2015 08:51   Dg Abd Acute W/chest  05/20/2015  CLINICAL DATA:  She c/o n/v--had chemo this past Tues. She is awake, alert and in no distress.Hx of pancreatic cancer. EXAM: DG ABDOMEN ACUTE W/ 1V CHEST COMPARISON:  05/03/2015 FINDINGS: Normal bowel gas pattern. No free air. No evidence of renal or ureteral stones. Changes from  cardiac surgery are stable. Cardiac silhouette is normal in size and configuration. There is a moderate hiatal hernia. No mediastinal or hilar masses. Small area of focal thickening noted along the minor fissure, stable. Mild linear atelectasis at the left lung base. IMPRESSION: 1. No acute findings in the abdomen pelvis. No evidence of bowel obstruction or free air. 2. No acute cardiopulmonary disease. Electronically Signed   By: Lajean Manes M.D.   On: 05/20/2015 16:03    Microbiology: Recent Results (from the past 240 hour(s))  Gastrointestinal Panel by PCR , Stool     Status: None   Collection Time: 05/21/15  2:38 PM  Result Value Ref Range Status   Campylobacter species NOT DETECTED NOT DETECTED Final   Plesimonas shigelloides NOT DETECTED NOT DETECTED Final   Salmonella species NOT DETECTED NOT DETECTED Final   Yersinia enterocolitica NOT DETECTED NOT DETECTED Final   Vibrio species NOT DETECTED NOT DETECTED Final   Vibrio cholerae NOT DETECTED NOT DETECTED Final   Enteroaggregative E coli (EAEC)  NOT DETECTED NOT DETECTED Final   Enteropathogenic E coli (EPEC) NOT DETECTED NOT DETECTED Final   Enterotoxigenic E coli (ETEC) NOT DETECTED NOT DETECTED Final   Shiga like toxin producing E coli (STEC) NOT DETECTED NOT DETECTED Final   E. coli O157 NOT DETECTED NOT DETECTED Final   Shigella/Enteroinvasive E coli (EIEC) NOT DETECTED NOT DETECTED Final   Cryptosporidium NOT DETECTED NOT DETECTED Final   Cyclospora cayetanensis NOT DETECTED NOT DETECTED Final   Entamoeba histolytica NOT DETECTED NOT DETECTED Final   Giardia lamblia NOT DETECTED NOT DETECTED Final   Adenovirus F40/41 NOT DETECTED NOT DETECTED Final   Astrovirus NOT DETECTED NOT DETECTED Final   Norovirus GI/GII NOT DETECTED NOT DETECTED Final   Rotavirus A NOT DETECTED NOT DETECTED Final   Sapovirus (I, II, IV, and V) NOT DETECTED NOT DETECTED Final  C difficile quick scan w PCR reflex     Status: None   Collection Time:  05/21/15  2:38 PM  Result Value Ref Range Status   C Diff antigen NEGATIVE NEGATIVE Final   C Diff toxin NEGATIVE NEGATIVE Final   C Diff interpretation Negative for toxigenic C. difficile  Final     Labs: Basic Metabolic Panel:  Recent Labs Lab 05/22/15 0500 05/23/15 0509 05/24/15 0347 05/25/15 0425 05/26/15 0455  NA 140 138 139 140 139  K 3.5 3.4* 4.0 3.5 3.4*  CL 104 105 107 106 106  CO2 27 27 26 26 25   GLUCOSE 90 98 97 107* 96  BUN 13 9 7 7 9   CREATININE 0.39* 0.60 0.46 0.46 0.41*  CALCIUM 8.2* 8.1* 8.0* 8.0* 7.9*  MG  --  1.3*  --   --   --    Liver Function Tests:  Recent Labs Lab 05/20/15 1624 05/21/15 0830  AST 51* 39  ALT 29 23  ALKPHOS 92 73  BILITOT 0.7 0.7  PROT 6.3* 5.3*  ALBUMIN 2.8* 2.5*    Recent Labs Lab 05/20/15 1624  LIPASE 26   No results for input(s): AMMONIA in the last 168 hours. CBC:  Recent Labs Lab 05/20/15 1624 05/21/15 0830 05/22/15 0500 05/23/15 0509 05/24/15 0347  WBC 7.3 5.8 3.8* 5.2 6.4  NEUTROABS 6.8 4.1  --   --   --   HGB 10.5* 9.8* 9.4* 9.1* 9.1*  HCT 33.8* 31.5* 30.1* 29.1* 29.3*  MCV 82.6 82.7 83.8 81.5 83.0  PLT 292 230 167 128* 115*   Cardiac Enzymes: No results for input(s): CKTOTAL, CKMB, CKMBINDEX, TROPONINI in the last 168 hours. BNP: BNP (last 3 results)  Recent Labs  05/04/15 0500  BNP 861.0*    ProBNP (last 3 results) No results for input(s): PROBNP in the last 8760 hours.  CBG: No results for input(s): GLUCAP in the last 168 hours.   Signed:  Birdie Hopes MD.  [facility] 05/26/2015, 11:57 AM

## 2015-05-26 NOTE — Progress Notes (Signed)
Went over d/c instructions.  Patient verbalized understanding.  Left hospital with hard scripts and personal belongings via w/c and personal vehicle.

## 2015-06-25 DEATH — deceased

## 2015-08-27 ENCOUNTER — Other Ambulatory Visit: Payer: Self-pay | Admitting: Nurse Practitioner

## 2017-09-22 IMAGING — CT CT ABDOMEN W/ CM
2 of 5 series · 14 of 46 positions shown, 16 images · IV contrast (OMNIPAQUE)
Comparison: No priors.

CLINICAL DATA: 74-year-old female with 3-4 month history of
epigastric pain. Left-sided breast cancer diagnosed in 3112 status
post left lumpectomy.

EXAM:
CT ABDOMEN WITH CONTRAST
TECHNIQUE: Multidetector CT imaging of the abdomen was performed using the
standard protocol following bolus administration of intravenous
contrast.
CONTRAST:  100mL OMNIPAQUE IOHEXOL 300 MG/ML  SOLN

[Series 2: rtn a/p with · axial · 0.74mm/px · z∈[-272,-82]mm · 11 of 46 slices shown, 13 images]
[im 4/46  soft-tissue]
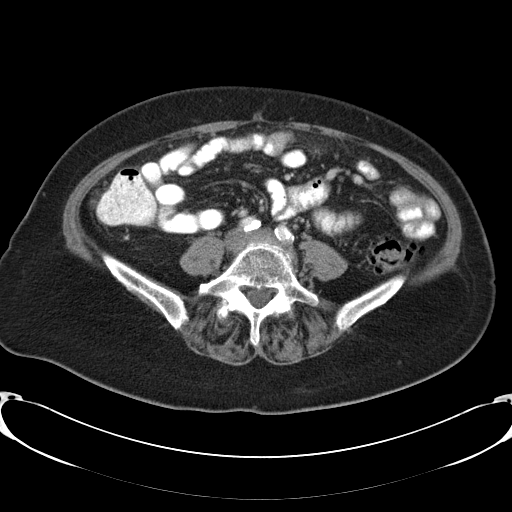
[im 4/46  bone]
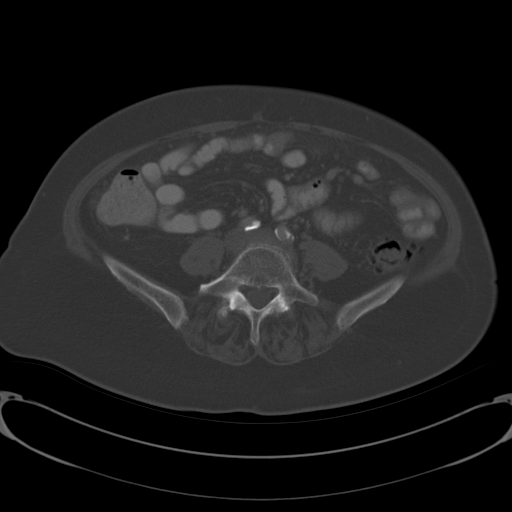
[im 7/46  soft-tissue]
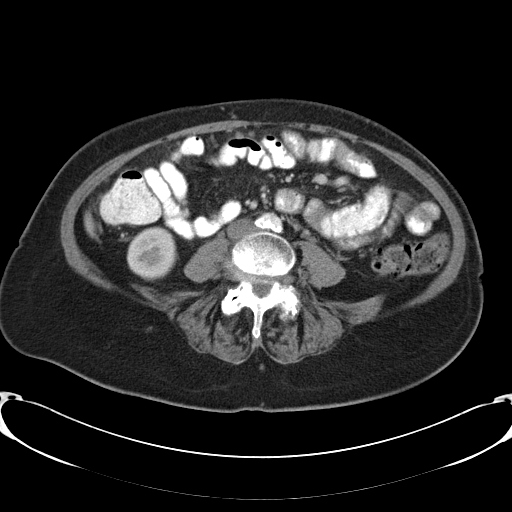
[im 10/46  soft-tissue]
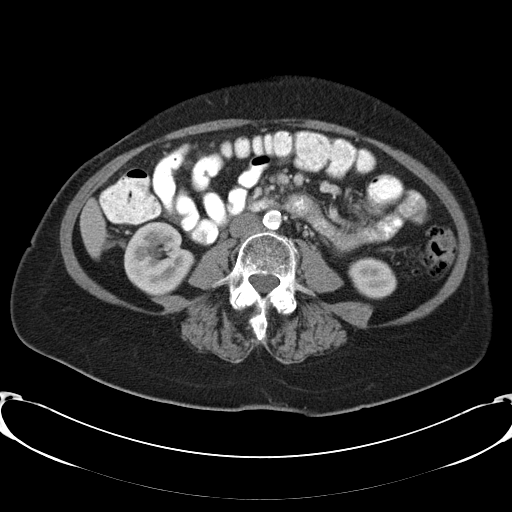
[im 17/46  soft-tissue]
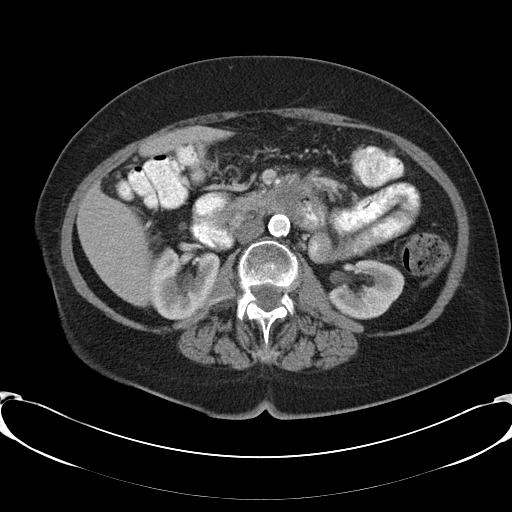
[im 20/46  soft-tissue]
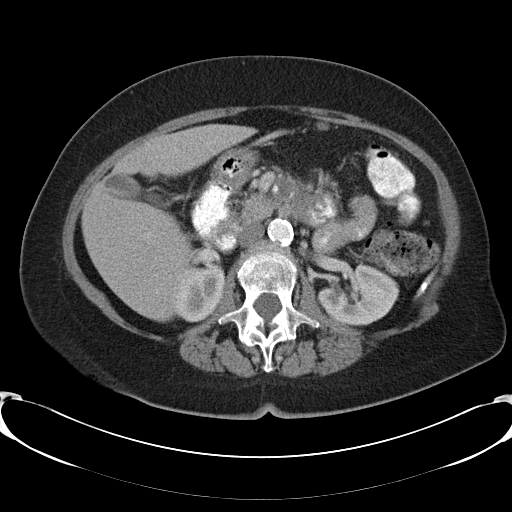
[im 23/46  soft-tissue]
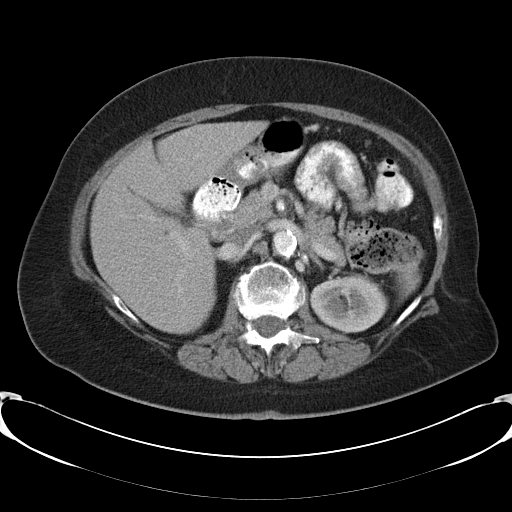
[im 26/46  soft-tissue]
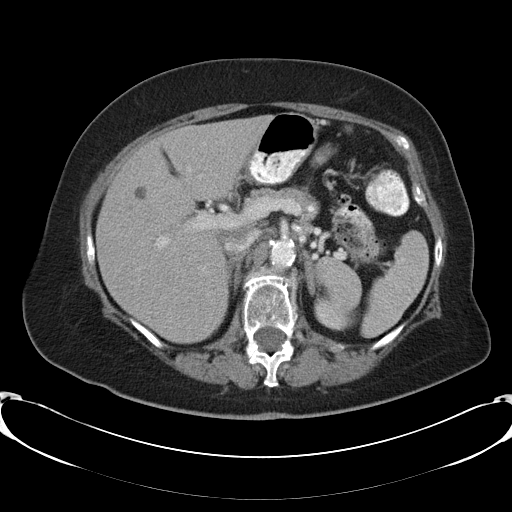
[im 29/46  soft-tissue]
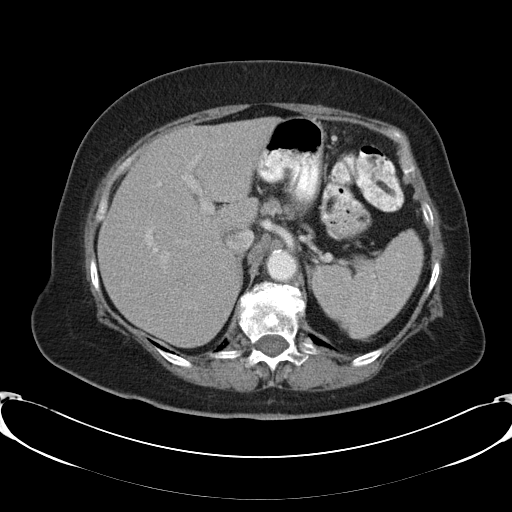
[im 36/46  soft-tissue]
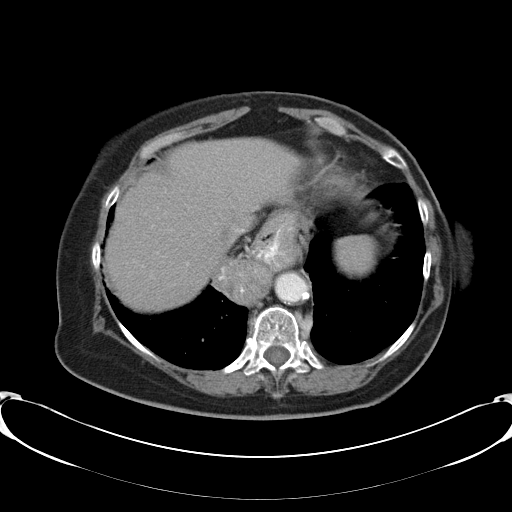
[im 36/46  bone]
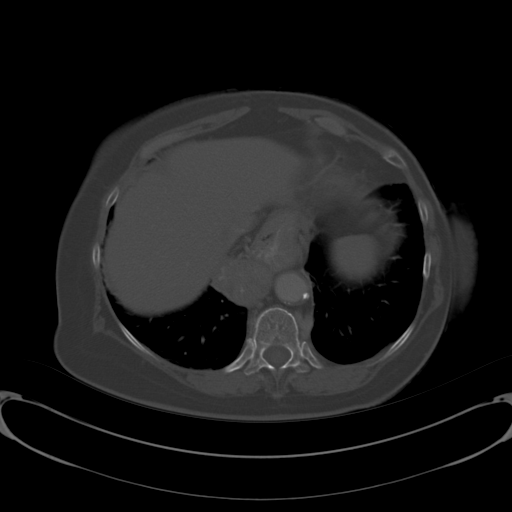
[im 39/46  soft-tissue]
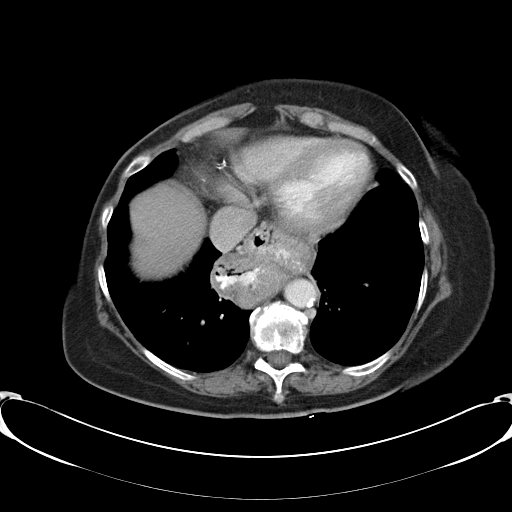
[im 42/46  soft-tissue]
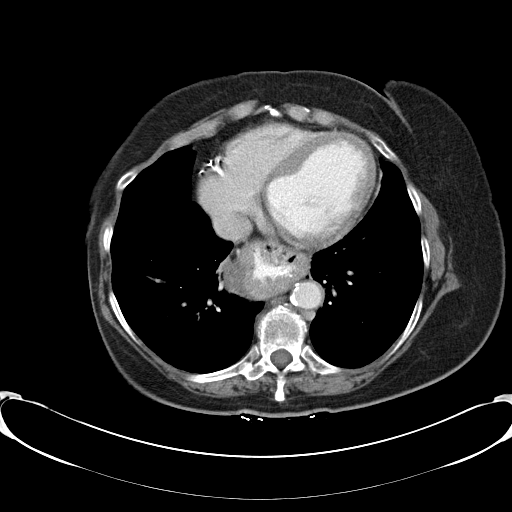

[Series 602: <mpr thick range> · coronal · 0.74mm/px · 3 of 85 slices shown]
[im 29/85  soft-tissue]
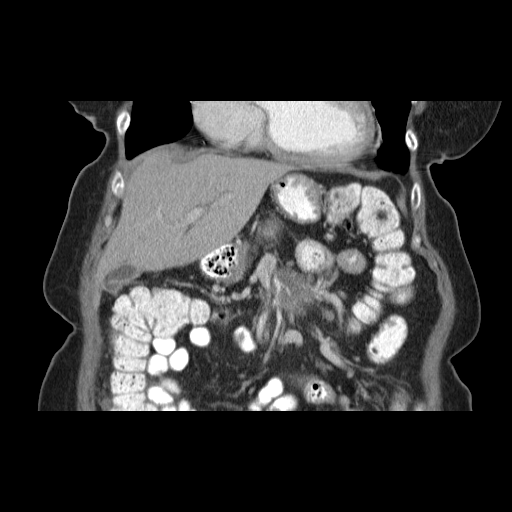
[im 38/85  soft-tissue]
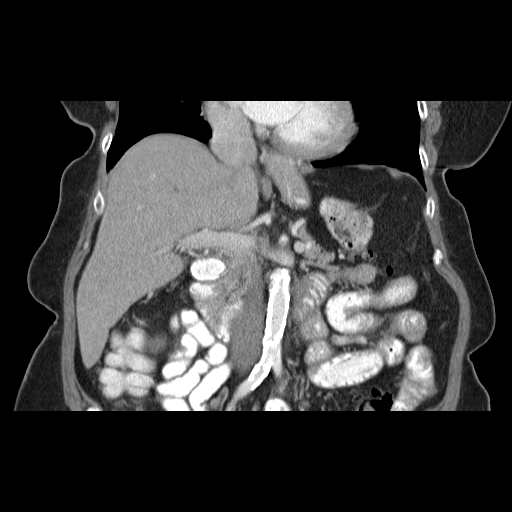
[im 47/85  soft-tissue]
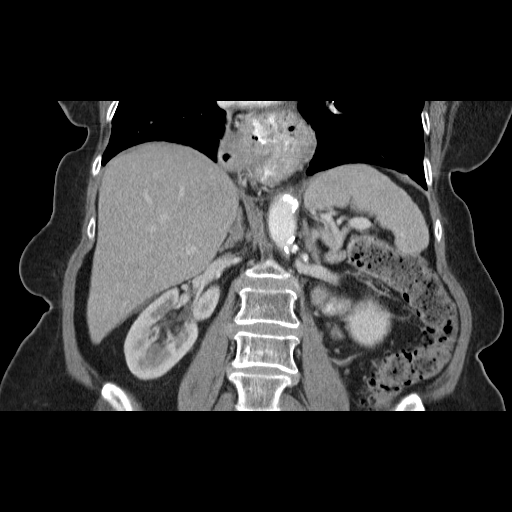

[14 of 46 positions shown; findings below may reference images not displayed]

FINDINGS: Lower chest: Large hiatal hernia. Cardiomegaly with left ventricular
dilatation. Atherosclerotic calcifications in the right coronary
artery. Status post median sternotomy.

Hepatobiliary: Several sub cm low-attenuation lesions in the liver
are too small to definitively characterize, but favored to represent
tiny cysts. In segment 7 of the liver there is also a less
well-defined 6 mm intermediate attenuation area which is
indeterminate (image 16 of series 2). No intra or extrahepatic
biliary ductal dilatation. Gallbladder is nearly completely
decompressed, but otherwise unremarkable in appearance.

Pancreas: Immediately adjacent to or (more likely) emanating from
the left side of the uncinate process of the pancreas there is a
infiltrative appearing mass which measures approximately 4.0 x 3.5 x
4.2 cm (image 29 of series 2 and image 31 of series 602). This mass
is very intimately associated with both the uncinate process of the
pancreas, and the third/fourth portions of the duodenum. This mass
also completely encases the superior mesenteric artery (best
demonstrated on axial image 29 of series 2), and comes in direct
contact with the superior mesenteric vein over approximately [DATE] of
its circumference (also image 29 of series 2), with complete loss of
the intervening fat plane. The mass is separate from the splenic
vein, and comes in close proximity to but appears to be separate
from the left renal vein. The posterior aspect of the mass is in
direct contact with the infrarenal abdominal aorta. No pancreatic
ductal dilatation. The remainder the pancreas is otherwise normal in
appearance.

Spleen: Unremarkable.

Adrenals/Urinary Tract: Bilateral adrenal glands and bilateral
kidneys are normal in appearance.

Stomach/Bowel: Intra abdominal portion of the stomach is normal.
Specifically, the stomach does not appear distended. As discussed
above, there is a mass in the retroperitoneum which appears
intimately associated with the third and fourth portions of the
duodenum (see above). No pathologic dilatation of the visualized
portions of small bowel or colon.

Vascular/Lymphatic: Extensive atherosclerosis in the abdominal
vasculature, without evidence of aneurysm or dissection. Vascular
involvement by retroperitoneal neoplasm, as discussed above. No
lymphadenopathy noted in the abdomen. Borderline enlarged mesenteric
lymph nodes measuring up to 9 mm in the root of the small bowel
mesenteries are certainly suspicious, however, particularly given
their proximity to the previously described mass.

Other: Multiple tiny areas of soft tissue nodularity associated the
omentum are highly concerning for intraperitoneal metastasis,
largest of which is in the left upper quadrant (image 22 of series
2) measuring 12 x 9 mm. Trace volume of ascites most evident in the
left pericolic gutter. Slight haziness throughout the omental fat.
No pneumoperitoneum noted in the visualized peritoneal cavity.
Numerous colonic diverticulae are noted.

Musculoskeletal: There are no aggressive appearing lytic or blastic
lesions noted in the visualized portions of the skeleton.
IMPRESSION: 1. Highly aggressive infiltrating neoplasm in the retroperitoneum
intimately associated with both the uncinate process of the pancreas
and the third/fourth portions of the duodenum. This is strongly
favored to be pancreatic in origin, and demonstrates vascular
involvement with complete encasement of the superior mesenteric
artery, early involvement of the superior mesenteric vein, and abuts
the infrarenal abdominal aorta, as discussed above. There is a
suspicious 6 mm lesion in segment 7 of the liver, potentially
metastatic. In addition, there appears to be widespread
intraperitoneal metastasis and a trace volume of ascites which may
be malignant ascites. Correlation with EGD for further evaluation
and biopsy is recommended at this time.
2. Additional incidental findings, as above.
These results will be called to the ordering clinician or
representative by the Radiologist Assistant, and communication
documented in the PACS or zVision Dashboard.

## 2017-11-23 IMAGING — CR DG CHEST 1V PORT
1 series · 1 of 1 positions shown · non-contrast
Comparison: 03/29/2015

CLINICAL DATA: Oxygen desaturation

EXAM:
PORTABLE CHEST 1 VIEW

[AP]
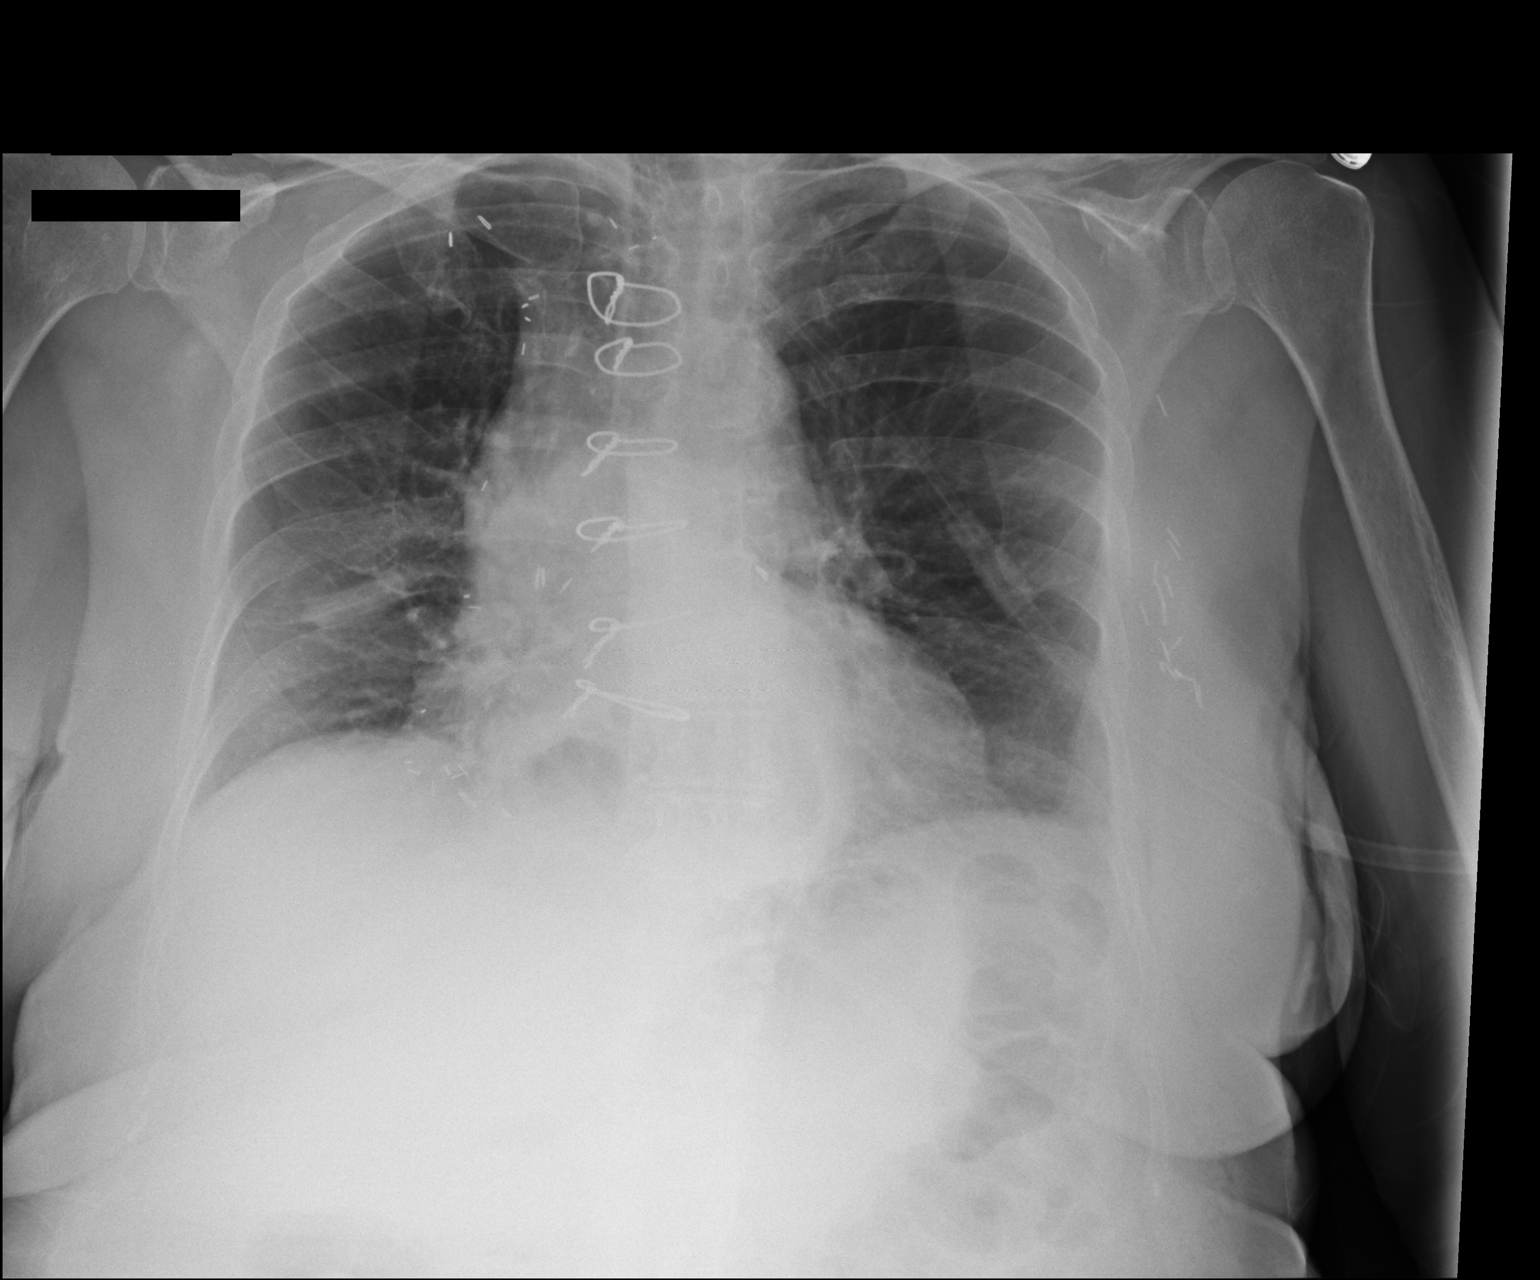

[1 of 1 positions shown; findings below may reference images not displayed]

FINDINGS: Moderate to large hiatal hernia.  Mild cardiac enlargement.

Mild left lower lobe atelectasis. Band of opacity right mid lung
zone appears consistent with subsegmental atelectasis similar to
prior study.
IMPRESSION: Compare sent to CT scan, allowing for comparison of different
modalities, shows no significant change with continued subsegmental
atelectasis right mid lung zone.

## 2017-11-29 IMAGING — US IR US GUIDE VASC ACCESS RIGHT
1 series · 1 of 1 positions shown · non-contrast
Comparison: none

ADDENDUM:
Nursing monitored the patient during sedation.
CLINICAL DATA: Pancreatic cancer

[Series 1: ir fluoro/shunt/fist · 1 of 1 slices shown]
[im 1/1]
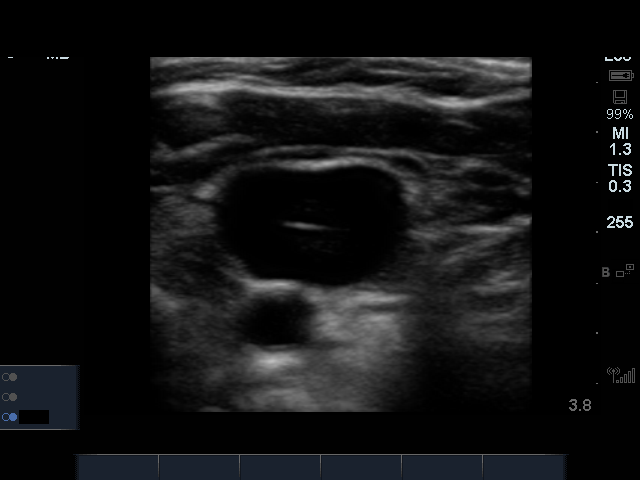

[1 of 1 positions shown; findings below may reference images not displayed]

EXAM:
TUNNEL POWER PORT PLACEMENT WITH SUBCUTANEOUS POCKET UTILIZING
ULTRASOUND & FLOUROSCOPY

FLUOROSCOPY TIME:  24 seconds

MEDICATIONS AND MEDICAL HISTORY:
Versed 1.5 mg, Fentanyl 50 mcg.

Additional Medications: As antibiotic prophylaxis, Ancef was ordered
pre-procedure and administered intravenously within one hour of
incision..

ANESTHESIA/SEDATION:
Moderate sedation time: 25 minutes

CONTRAST:  None

PROCEDURE:
After written informed consent was obtained, patient was placed in
the supine position on angiographic table. The right neck and chest
was prepped and draped in a sterile fashion. Lidocaine was utilized
for local anesthesia. The right jugular vein was noted to be patent
initially with ultrasound. Under sonographic guidance, a
micropuncture needle was inserted into the right IJ vein (Ultrasound
and fluoroscopic image documentation was performed). The needle was
removed over an 018 wire which was exchanged for a Amplatz. This was
advanced into the IVC. An 8-French dilator was advanced over the
Amplatz.

A small incision was made in the right upper chest over the anterior
right second rib. Utilizing blunt dissection, a subcutaneous pocket
was created in the caudal direction. The pocket was irrigated with a
copious amount of sterile normal saline. The port catheter was
tunneled from the chest incision, and out the neck incision. The
reservoir was inserted into the subcutaneous pocket and secured with
two 3-0 Ethilon stitches. A peel-away sheath was advanced over the
Amplatz wire. The port catheter was cut to measure length and
inserted through the peel-away sheath. The peel-away sheath was
removed. The chest incision was closed with 3-0 Vicryl interrupted
stitches for the subcutaneous tissue and a running of 4-0 Vicryl
subcuticular stitch for the skin. The neck incision was closed with
a 4-0 Vicryl subcuticular stitch. Derma-bond was applied to both
surgical incisions. The port reservoir was flushed and instilled
with heparinized saline. No complications.
FINDINGS: A right IJ vein Port-A-Cath is in place with its tip at the
cavoatrial junction.

COMPLICATIONS:
None
IMPRESSION: Successful 8 French right internal jugular vein power port placement
with its tip at the SVC/RA junction.
